# Patient Record
Sex: Male | Born: 1935
Health system: Southern US, Community
[De-identification: ages and names within clinical notes are randomized; demographics above are authoritative.]

## PROBLEM LIST (undated history)

## (undated) DIAGNOSIS — N400 Enlarged prostate without lower urinary tract symptoms: Secondary | ICD-10-CM

## (undated) DIAGNOSIS — M810 Age-related osteoporosis without current pathological fracture: Secondary | ICD-10-CM

## (undated) DIAGNOSIS — S62102A Fracture of unspecified carpal bone, left wrist, initial encounter for closed fracture: Secondary | ICD-10-CM

## (undated) DIAGNOSIS — N200 Calculus of kidney: Secondary | ICD-10-CM

## (undated) DIAGNOSIS — S62101A Fracture of unspecified carpal bone, right wrist, initial encounter for closed fracture: Secondary | ICD-10-CM

## (undated) HISTORY — DX: Benign prostatic hyperplasia without lower urinary tract symptoms: N40.0

## (undated) HISTORY — DX: Calculus of kidney: N20.0

## (undated) HISTORY — DX: Age-related osteoporosis without current pathological fracture: M81.0

## (undated) HISTORY — PX: TONSILLECTOMY: SUR1361

## (undated) HISTORY — DX: Fracture of unspecified carpal bone, left wrist, initial encounter for closed fracture: S62.102A

## (undated) HISTORY — DX: Fracture of unspecified carpal bone, right wrist, initial encounter for closed fracture: S62.101A

## (undated) HISTORY — PX: HERNIA REPAIR: SHX51

---

## 1982-09-05 DIAGNOSIS — S62102A Fracture of unspecified carpal bone, left wrist, initial encounter for closed fracture: Secondary | ICD-10-CM

## 1982-09-05 HISTORY — DX: Fracture of unspecified carpal bone, left wrist, initial encounter for closed fracture: S62.102A

## 1994-09-05 DIAGNOSIS — S62101A Fracture of unspecified carpal bone, right wrist, initial encounter for closed fracture: Secondary | ICD-10-CM

## 1994-09-05 HISTORY — DX: Fracture of unspecified carpal bone, right wrist, initial encounter for closed fracture: S62.101A

## 2007-03-06 ENCOUNTER — Ambulatory Visit: Payer: Self-pay | Admitting: Gastroenterology

## 2012-03-16 LAB — TSH: TSH: 1.22 u[IU]/mL (ref 0.41–5.90)

## 2012-12-04 DIAGNOSIS — M810 Age-related osteoporosis without current pathological fracture: Secondary | ICD-10-CM | POA: Insufficient documentation

## 2012-12-04 DIAGNOSIS — N200 Calculus of kidney: Secondary | ICD-10-CM | POA: Insufficient documentation

## 2012-12-04 DIAGNOSIS — I350 Nonrheumatic aortic (valve) stenosis: Secondary | ICD-10-CM | POA: Insufficient documentation

## 2012-12-11 ENCOUNTER — Encounter: Payer: Self-pay | Admitting: Internal Medicine

## 2012-12-11 ENCOUNTER — Ambulatory Visit (INDEPENDENT_AMBULATORY_CARE_PROVIDER_SITE_OTHER): Payer: Medicare Other | Admitting: Internal Medicine

## 2012-12-11 VITALS — BP 136/72 | HR 90 | Temp 97.9°F | Resp 18 | Ht 66.0 in | Wt 154.8 lb

## 2012-12-11 DIAGNOSIS — R972 Elevated prostate specific antigen [PSA]: Secondary | ICD-10-CM

## 2012-12-11 DIAGNOSIS — I359 Nonrheumatic aortic valve disorder, unspecified: Secondary | ICD-10-CM

## 2012-12-11 DIAGNOSIS — N400 Enlarged prostate without lower urinary tract symptoms: Secondary | ICD-10-CM

## 2012-12-11 DIAGNOSIS — N200 Calculus of kidney: Secondary | ICD-10-CM

## 2012-12-11 DIAGNOSIS — D72819 Decreased white blood cell count, unspecified: Secondary | ICD-10-CM

## 2012-12-11 DIAGNOSIS — I350 Nonrheumatic aortic (valve) stenosis: Secondary | ICD-10-CM

## 2012-12-11 DIAGNOSIS — E559 Vitamin D deficiency, unspecified: Secondary | ICD-10-CM

## 2012-12-11 DIAGNOSIS — M810 Age-related osteoporosis without current pathological fracture: Secondary | ICD-10-CM

## 2012-12-11 LAB — CBC WITH DIFFERENTIAL/PLATELET
Basophils Absolute: 0 10*3/uL (ref 0.0–0.1)
Basophils Relative: 0.5 % (ref 0.0–3.0)
Eosinophils Absolute: 0.2 10*3/uL (ref 0.0–0.7)
Eosinophils Relative: 2.9 % (ref 0.0–5.0)
HCT: 42.2 % (ref 39.0–52.0)
Hemoglobin: 14.1 g/dL (ref 13.0–17.0)
Lymphocytes Relative: 21.1 % (ref 12.0–46.0)
Lymphs Abs: 1.5 10*3/uL (ref 0.7–4.0)
MCHC: 33.5 g/dL (ref 30.0–36.0)
MCV: 89.5 fl (ref 78.0–100.0)
Monocytes Absolute: 0.4 10*3/uL (ref 0.1–1.0)
Monocytes Relative: 5.8 % (ref 3.0–12.0)
Neutro Abs: 5.1 10*3/uL (ref 1.4–7.7)
Neutrophils Relative %: 69.7 % (ref 43.0–77.0)
Platelets: 167 10*3/uL (ref 150.0–400.0)
RBC: 4.71 Mil/uL (ref 4.22–5.81)
RDW: 14.2 % (ref 11.5–14.6)
WBC: 7.3 10*3/uL (ref 4.5–10.5)

## 2012-12-11 LAB — PSA, MEDICARE: PSA: 7.79 ng/ml — ABNORMAL HIGH (ref 0.10–4.00)

## 2012-12-12 LAB — VITAMIN D 25 HYDROXY (VIT D DEFICIENCY, FRACTURES): Vit D, 25-Hydroxy: 29 ng/mL — ABNORMAL LOW (ref 30–89)

## 2012-12-16 ENCOUNTER — Encounter: Payer: Self-pay | Admitting: Internal Medicine

## 2012-12-16 DIAGNOSIS — N400 Enlarged prostate without lower urinary tract symptoms: Secondary | ICD-10-CM | POA: Insufficient documentation

## 2012-12-16 NOTE — Assessment & Plan Note (Signed)
Declines medication.  Check vitamin D level.

## 2012-12-16 NOTE — Progress Notes (Signed)
  Subjective:    Patient ID: Marvin Farley, male    DOB: May 21, 1932, 79 y.o.   MRN: [id number]  HPI 79 year old male with past history of rib fracture and previous radial fracture with documented osteoporosis who comes in today to follow up on this as well as to transfer care here to [facility].  Was previously seen by me at [facility].  States he recently has had some congestion.  Using saline and taking vitamin C.  Better.  No chest pain or tightness.  Breathing stable.  No increased sob.  No acid reflux.  No nausea or vomiting.  No bowel change.  Works long hours.  Some increased stress with family issues.  Feels he is handling things relatively well.     Past Medical History  Diagnosis Date  . Osteoporosis   . Kidney stones   . BPH (benign prostatic hypertrophy)   . Left wrist fracture 1984  . Right wrist fracture 1996    Current Outpatient Prescriptions on File Prior to Visit  Medication Sig Dispense Refill  . Multiple Vitamin (MULTIVITAMIN) capsule Take 1 capsule by mouth daily.       No current facility-administered medications on file prior to visit.    Review of Systems Patient denies any headache, lightheadedness or dizziness.  Some congestion now.  Better.  See above.  No chest pain, tightness or palpitations.  No increased shortness of breath, cough or congestion.  No acid reflux.  No nausea or vomiting.  No abdominal pain or cramping.  No bowel change, such as diarrhea, constipation, BRBPR or melana.  No urine change.  Handling stress relatively well.        Objective:   Physical Exam Filed Vitals:   12/11/12 1338  BP: 136/72  Pulse: 90  Temp: 97.9 F (36.6 C)  Resp: 18   Pulse recheck:  42  79 year old male in no acute distress.  HEENT:  Nares - clear.  Oropharynx - without lesions. NECK:  Supple.  Nontender.  No audible carotid bruit.  HEART:  Appears to be regular.  II/VI systolic murmur.   LUNGS:  No crackles or wheezing audible.  Respirations even and  unlabored.   RADIAL PULSE:  Equal bilaterally.  ABDOMEN:  Soft.  Nontender.  Bowel sounds present and normal.  No audible abdominal bruit.  EXTREMITIES:  No increased edema present.  DP pulses palpable and equal bilaterally.         Assessment & Plan:  GI.  Colonoscopy 7/08 - normal.    HEALTH MAINTENANCE.  Schedule a physical next visit.  He has declined urology referral for elevated PSA.  Will recheck today.  Colonoscopy 7/08 - normal.

## 2012-12-16 NOTE — Assessment & Plan Note (Signed)
Has a history of BPH.  Currently without urinary symptoms.  PSA increased.  Discussed with him regarding referral to urology.  He has declined.  Will recheck today.

## 2012-12-16 NOTE — Assessment & Plan Note (Signed)
Had follow up ECHO this fall.  Obtain results. Currently asymptomatic.  Has declined cardiology evaluation.

## 2012-12-16 NOTE — Assessment & Plan Note (Signed)
Asymptomatic.  Follow.   

## 2012-12-21 ENCOUNTER — Emergency Department: Payer: Self-pay | Admitting: Emergency Medicine

## 2013-01-14 ENCOUNTER — Encounter: Payer: Self-pay | Admitting: Internal Medicine

## 2013-02-21 ENCOUNTER — Encounter: Payer: Medicare Other | Admitting: Internal Medicine

## 2013-03-28 ENCOUNTER — Ambulatory Visit (INDEPENDENT_AMBULATORY_CARE_PROVIDER_SITE_OTHER): Payer: Self-pay | Admitting: Internal Medicine

## 2013-03-28 ENCOUNTER — Encounter: Payer: Self-pay | Admitting: Internal Medicine

## 2013-03-28 VITALS — BP 120/80 | HR 72 | Temp 98.7°F | Ht 65.5 in | Wt 154.8 lb

## 2013-03-28 DIAGNOSIS — N400 Enlarged prostate without lower urinary tract symptoms: Secondary | ICD-10-CM

## 2013-03-28 DIAGNOSIS — M810 Age-related osteoporosis without current pathological fracture: Secondary | ICD-10-CM

## 2013-03-28 DIAGNOSIS — I359 Nonrheumatic aortic valve disorder, unspecified: Secondary | ICD-10-CM

## 2013-03-28 DIAGNOSIS — N200 Calculus of kidney: Secondary | ICD-10-CM

## 2013-03-28 DIAGNOSIS — I350 Nonrheumatic aortic (valve) stenosis: Secondary | ICD-10-CM

## 2013-03-28 NOTE — Progress Notes (Signed)
  Subjective:    Patient ID: Marvin Farley, male    DOB: 06-17-1932, 79 y.o.   MRN: [id number]  HPI 79 year old male with past history of rib fracture and previous radial fracture with documented osteoporosis who comes in today to follow up on these issues as well as for a complete physical exam.   No chest pain or tightness.  Breathing stable.  No increased sob.  No acid reflux.  No nausea or vomiting.  No bowel change.  Works long hours.  Some increased stress with family issues.  Feels he is handling things relatively well.  A ladder fell and hit his head on Good Friday.  To ER.  Had CT and MRI.  Diagnosed with a concussion.  Took 6 weeks to get back to normal.  Does feel back to baseline now.  No headache or dizziness now.  Overall he feels he is doing well.     Past Medical History  Diagnosis Date  . Osteoporosis   . Kidney stones   . BPH (benign prostatic hypertrophy)   . Left wrist fracture 1984  . Right wrist fracture 1996    Current Outpatient Prescriptions on File Prior to Visit  Medication Sig Dispense Refill  . Multiple Vitamin (MULTIVITAMIN) capsule Take 1 capsule by mouth daily.       No current facility-administered medications on file prior to visit.    Review of Systems Patient denies any headache, lightheadedness or dizziness now.  Symptoms back to baseline.   No chest pain, tightness or palpitations.  No increased shortness of breath, cough or congestion.  No acid reflux.  No nausea or vomiting.  No abdominal pain or cramping.  No bowel change, such as diarrhea, constipation, BRBPR or melana.  No urine change.  Handling stress relatively well.   Overall he feels he is doing well.  Discussed elevated PSA.  Desires no further w/up or evaluation.       Objective:   Physical Exam  Filed Vitals:   03/28/13 1118  BP: 120/80  Pulse: 72  Temp: 98.7 F (37.1 C)   Blood pressure recheck:  120/78, pulse 59  79 year old male in no acute distress.  HEENT:  Nares - clear.   Oropharynx - without lesions. NECK:  Supple.  Nontender.  No audible carotid bruit.  HEART:  Appears to be regular.  II/VI systolic murmur.   LUNGS:  No crackles or wheezing audible.  Respirations even and unlabored.   RADIAL PULSE:  Equal bilaterally.  ABDOMEN:  Soft.  Nontender.  Bowel sounds present and normal.  No audible abdominal bruit.  GU:  Normal descended testicles.  No palpable testicular nodules.   RECTAL:  Could not appreciate any palpable prostate nodules.  Heme negative.   EXTREMITIES:  No increased edema present.  DP pulses palpable and equal bilaterally.       Assessment & Plan:  GI.  Colonoscopy 7/08 - normal.    PREVIOUS CONCUSSION.  Resolved.  Follow.   HEALTH MAINTENANCE.  Physical today.   He has declined urology referral for elevated PSA.  Will recheck today.  Colonoscopy 7/08 - normal.

## 2013-03-31 ENCOUNTER — Encounter: Payer: Self-pay | Admitting: Internal Medicine

## 2013-03-31 NOTE — Assessment & Plan Note (Signed)
Declines medication.  Check vitamin D level.

## 2013-03-31 NOTE — Assessment & Plan Note (Signed)
Asymptomatic.  Follow.   

## 2013-03-31 NOTE — Assessment & Plan Note (Signed)
Had follow up ECHO this fall.  Obtain results. Currently asymptomatic.  Has declined cardiology evaluation.

## 2013-03-31 NOTE — Assessment & Plan Note (Signed)
Has a history of BPH.  Currently without urinary symptoms.  PSA increased.  Discussed with him regarding referral to urology.  He has declined.  Will recheck today.

## 2013-04-08 ENCOUNTER — Encounter: Payer: Self-pay | Admitting: Internal Medicine

## 2013-05-30 ENCOUNTER — Ambulatory Visit (INDEPENDENT_AMBULATORY_CARE_PROVIDER_SITE_OTHER): Payer: Medicare Other | Admitting: Internal Medicine

## 2013-05-30 ENCOUNTER — Encounter: Payer: Self-pay | Admitting: Internal Medicine

## 2013-05-30 VITALS — BP 110/80 | HR 75 | Temp 98.2°F | Ht 65.5 in | Wt 157.2 lb

## 2013-05-30 DIAGNOSIS — J329 Chronic sinusitis, unspecified: Secondary | ICD-10-CM

## 2013-05-30 MED ORDER — AZITHROMYCIN 250 MG PO TABS: ORAL_TABLET | ORAL | Status: DC

## 2013-05-30 MED ORDER — FLUTICASONE PROPIONATE 50 MCG/ACT NA SUSP: 2.0000 | Freq: Every day | NASAL | Status: DC

## 2013-05-30 NOTE — Progress Notes (Signed)
  Subjective:    Patient ID: Marvin Farley, male    DOB: 1932-09-02, 79 y.o.   MRN: [id number]  Sinus Problem  79 year old male with past history of rib fracture and previous radial fracture with documented osteoporosis who comes in today as a work in with concerns regarding a possible sinus infection. Reports that starting one week ago, he developed sinus pressure.  Some headache.  Increase nasal congestion and drainage.  Some cough.  No sore throat.  No chest congestion.   No chest pain or tightness.  No increased sob.  No acid reflux.  No nausea or vomiting.  No bowel change.     Past Medical History  Diagnosis Date  . Osteoporosis   . Kidney stones   . BPH (benign prostatic hypertrophy)   . Left wrist fracture 1984  . Right wrist fracture 1996    Current Outpatient Prescriptions on File Prior to Visit  Medication Sig Dispense Refill  . Multiple Vitamin (MULTIVITAMIN) capsule Take 1 capsule by mouth daily.       No current facility-administered medications on file prior to visit.    Review of Systems Patient denies any headache, lightheadedness or dizziness.  Does report increased sinus pressure and congestion as outlined.  Increased drainage.  No chest pain, tightness or palpitations.  No increased shortness of breath.  Some cough.  No acid reflux.  No nausea or vomiting.  No abdominal pain or cramping.  No bowel change, such as diarrhea, constipation, BRBPR or melana.  No urine change.  Handling stress relatively well.   Has tried vitamin C.  Is flushing his nose.       Objective:   Physical Exam  Filed Vitals:   05/30/13 0850  BP: 110/80  Pulse: 75  Temp: 98.2 F (15.43 C)   79 year old male in no acute distress.  HEENT:  Nares - slightly erythematous turbinates.  Oropharynx - without lesions.  TMs visualized - without erythema.   NECK:  Supple.  Nontender.  HEART:  Appears to be regular.  II/VI systolic murmur.   LUNGS:  No crackles or wheezing audible.  Respirations  even and unlabored.   RADIAL PULSE:  Equal bilaterally.      Assessment & Plan:  SINUSITIS.  Treat with zpak as directed.  Flonase and saline nasal spray as directed.  Robitussin as directed.  Notify me or be reevaluated if symptoms persist, worsen or do not resolve.    GI.  Colonoscopy 7/08 - normal.    PREVIOUS CONCUSSION.  Resolved.  Follow.   HEALTH MAINTENANCE.  Physical last visit.   He has declined urology referral for elevated PSA.  Colonoscopy 7/08 - normal.

## 2013-06-02 ENCOUNTER — Encounter: Payer: Self-pay | Admitting: Internal Medicine

## 2013-06-21 ENCOUNTER — Other Ambulatory Visit (INDEPENDENT_AMBULATORY_CARE_PROVIDER_SITE_OTHER): Payer: Medicare Other

## 2013-06-21 DIAGNOSIS — Z125 Encounter for screening for malignant neoplasm of prostate: Secondary | ICD-10-CM

## 2013-06-21 DIAGNOSIS — N400 Enlarged prostate without lower urinary tract symptoms: Secondary | ICD-10-CM

## 2013-06-21 DIAGNOSIS — I359 Nonrheumatic aortic valve disorder, unspecified: Secondary | ICD-10-CM

## 2013-06-21 DIAGNOSIS — I350 Nonrheumatic aortic (valve) stenosis: Secondary | ICD-10-CM

## 2013-06-21 LAB — COMPREHENSIVE METABOLIC PANEL
ALT: 10 U/L (ref 0–53)
AST: 22 U/L (ref 0–37)
Albumin: 4 g/dL (ref 3.5–5.2)
Alkaline Phosphatase: 63 U/L (ref 39–117)
BUN: 20 mg/dL (ref 6–23)
CO2: 28 mEq/L (ref 19–32)
Calcium: 9.1 mg/dL (ref 8.4–10.5)
Chloride: 102 mEq/L (ref 96–112)
Creatinine, Ser: 1 mg/dL (ref 0.4–1.5)
GFR: 76.47 mL/min (ref >60.00)
Glucose, Bld: 98 mg/dL (ref 70–99)
Potassium: 5.2 mEq/L — ABNORMAL HIGH (ref 3.5–5.1)
Sodium: 138 mEq/L (ref 135–145)
Total Bilirubin: 0.8 mg/dL (ref 0.3–1.2)
Total Protein: 6.8 g/dL (ref 6.0–8.3)

## 2013-06-21 LAB — LIPID PANEL
Cholesterol: 177 mg/dL (ref 0–200)
HDL: 59.4 mg/dL (ref >39.00)
LDL Cholesterol: 106 mg/dL — ABNORMAL HIGH (ref 0–99)
Total CHOL/HDL Ratio: 3
Triglycerides: 56 mg/dL (ref 0.0–149.0)
VLDL: 11.2 mg/dL (ref 0.0–40.0)

## 2013-06-21 LAB — PSA, MEDICARE: PSA: 6.45 ng/ml — ABNORMAL HIGH (ref 0.10–4.00)

## 2013-06-24 ENCOUNTER — Other Ambulatory Visit: Payer: Self-pay | Admitting: Internal Medicine

## 2013-06-24 DIAGNOSIS — E875 Hyperkalemia: Secondary | ICD-10-CM

## 2013-06-24 NOTE — Progress Notes (Signed)
Order placed for f/u potassium.  

## 2013-06-25 ENCOUNTER — Telehealth: Payer: Self-pay | Admitting: Internal Medicine

## 2013-06-25 NOTE — Telephone Encounter (Signed)
Spouse dropped off medical report for automobile insurance Need ASAP In box

## 2013-06-25 NOTE — Telephone Encounter (Signed)
See his wife's message for more detail.  I reviewed the form and I need to see him to have this completed.  Let me know and can schedule an appt.

## 2013-06-25 NOTE — Telephone Encounter (Signed)
Form placed in your green folder

## 2013-06-26 ENCOUNTER — Other Ambulatory Visit (INDEPENDENT_AMBULATORY_CARE_PROVIDER_SITE_OTHER): Payer: Medicare Other

## 2013-06-26 ENCOUNTER — Encounter: Payer: Self-pay | Admitting: *Deleted

## 2013-06-26 DIAGNOSIS — E875 Hyperkalemia: Secondary | ICD-10-CM

## 2013-06-26 LAB — POTASSIUM: Potassium: 4.9 mEq/L (ref 3.5–5.1)

## 2013-06-26 NOTE — Telephone Encounter (Signed)
Left message for patient to call the office to schedule appt to complete medical form

## 2013-06-26 NOTE — Telephone Encounter (Signed)
I spoke with pt this morning while he was in the office for labs & informed him to stop & schedule appt at front desk

## 2013-07-03 ENCOUNTER — Other Ambulatory Visit: Payer: Medicare Other

## 2013-07-04 ENCOUNTER — Ambulatory Visit (INDEPENDENT_AMBULATORY_CARE_PROVIDER_SITE_OTHER): Payer: Medicare Other | Admitting: Internal Medicine

## 2013-07-04 ENCOUNTER — Encounter: Payer: Self-pay | Admitting: Internal Medicine

## 2013-07-04 VITALS — BP 110/80 | HR 70 | Temp 98.0°F | Ht 65.5 in | Wt 156.2 lb

## 2013-07-04 DIAGNOSIS — M810 Age-related osteoporosis without current pathological fracture: Secondary | ICD-10-CM

## 2013-07-04 DIAGNOSIS — I350 Nonrheumatic aortic (valve) stenosis: Secondary | ICD-10-CM

## 2013-07-04 DIAGNOSIS — I359 Nonrheumatic aortic valve disorder, unspecified: Secondary | ICD-10-CM

## 2013-07-07 ENCOUNTER — Encounter: Payer: Self-pay | Admitting: Internal Medicine

## 2013-07-07 NOTE — Assessment & Plan Note (Signed)
Had follow up ECHO this fall.  Currently asymptomatic.   Stays active with no cardiac symptoms with increased activity or exertion.  Has declined cardiac evaluation.

## 2013-07-07 NOTE — Progress Notes (Signed)
  Subjective:    Patient ID: Marvin Farley, male    DOB: 1932-07-06, 79 y.o.   MRN: [id number]  HPI 79 year old male with past history of rib fracture and previous radial fracture with documented osteoporosis who comes in today for a form completion.  No chest pain or tightness.  Breathing stable.  No increased sob.  No acid reflux.  No nausea or vomiting.  No bowel change.  Works long hours.  No cardiac symptoms with increased activity or exertion.  No back pain or leg pain.  No light headedness or dizziness.  Vision unchanged.  Overall he feels he is doing well.    Past Medical History  Diagnosis Date  . Osteoporosis   . Kidney stones   . BPH (benign prostatic hypertrophy)   . Left wrist fracture 1984  . Right wrist fracture 1996    Current Outpatient Prescriptions on File Prior to Visit  Medication Sig Dispense Refill  . Multiple Vitamin (MULTIVITAMIN) capsule Take 1 capsule by mouth daily.       No current facility-administered medications on file prior to visit.    Review of Systems Patient denies any headache, lightheadedness or dizziness.   No chest pain, tightness or palpitations.  No increased shortness of breath, cough or congestion.  No acid reflux.  No nausea or vomiting.  No abdominal pain or cramping.  No back pain.  Able to lift and pull without difficulty.  No cardiac symptoms with increased activity or exertion.        Objective:   Physical Exam  Filed Vitals:   07/04/13 1602  BP: 110/80  Pulse: 70  Temp: 98 F (39.62 C)   79 year old male in no acute distress.  HEENT:  Nares - clear.  Oropharynx - without lesions. NECK:  Supple.  Nontender.  No audible carotid bruit.  HEART:  Appears to be regular.  II/VI systolic murmur.   LUNGS:  No crackles or wheezing audible.  Respirations even and unlabored.   RADIAL PULSE:  Equal bilaterally.  ABDOMEN:  Soft.  Nontender.  Bowel sounds present and normal.  No audible abdominal bruit.   EXTREMITIES:  No increased  edema present.  DP pulses palpable and equal bilaterally.       Assessment & Plan:  GI.  Colonoscopy 7/08 - normal.    HEALTH MAINTENANCE.  Physical 03/28/13.   He has declined urology referral for elevated PSA.   Colonoscopy 7/08 - normal.

## 2013-07-07 NOTE — Assessment & Plan Note (Signed)
Declines medication.  Check vitamin D level.  Continue weight bearing exercise.    

## 2013-10-01 ENCOUNTER — Ambulatory Visit (INDEPENDENT_AMBULATORY_CARE_PROVIDER_SITE_OTHER): Payer: Medicare Other | Admitting: Internal Medicine

## 2013-10-01 ENCOUNTER — Encounter: Payer: Self-pay | Admitting: Internal Medicine

## 2013-10-01 VITALS — BP 120/70 | HR 75 | Temp 97.9°F | Ht 65.5 in | Wt 155.5 lb

## 2013-10-01 DIAGNOSIS — I359 Nonrheumatic aortic valve disorder, unspecified: Secondary | ICD-10-CM

## 2013-10-01 DIAGNOSIS — M810 Age-related osteoporosis without current pathological fracture: Secondary | ICD-10-CM

## 2013-10-01 DIAGNOSIS — N400 Enlarged prostate without lower urinary tract symptoms: Secondary | ICD-10-CM

## 2013-10-01 DIAGNOSIS — N200 Calculus of kidney: Secondary | ICD-10-CM

## 2013-10-01 DIAGNOSIS — I350 Nonrheumatic aortic (valve) stenosis: Secondary | ICD-10-CM

## 2013-10-01 NOTE — Progress Notes (Signed)
Pre-visit discussion using our clinic review tool. No additional management support is needed unless otherwise documented below in the visit note.  

## 2013-10-05 ENCOUNTER — Encounter: Payer: Self-pay | Admitting: Internal Medicine

## 2013-10-05 NOTE — Assessment & Plan Note (Signed)
Declines medication.  Check vitamin D level.  Continue weight bearing exercise.    

## 2013-10-05 NOTE — Progress Notes (Signed)
  Subjective:    Patient ID: Marvin Farley, male    DOB: 08/23/1932, 80 y.o.   MRN: [id number]  HPI 80 year old male with past history of rib fracture and previous radial fracture with documented osteoporosis who comes in today for a scheduled follow up.   No chest pain or tightness.  Breathing stable.  No increased sob.  No acid reflux.  No nausea or vomiting.  No bowel change.  Works long hours.  No cardiac symptoms with increased activity or exertion.  No back pain or leg pain.  No light headedness or dizziness.  Vision unchanged.  Overall he feels he is doing well.    Past Medical History  Diagnosis Date  . Osteoporosis   . Kidney stones   . BPH (benign prostatic hypertrophy)   . Left wrist fracture 1984  . Right wrist fracture 1996    Current Outpatient Prescriptions on File Prior to Visit  Medication Sig Dispense Refill  . Multiple Vitamin (MULTIVITAMIN) capsule Take 1 capsule by mouth daily.       No current facility-administered medications on file prior to visit.    Review of Systems Patient denies any headache, lightheadedness or dizziness.   No chest pain, tightness or palpitations.  No increased shortness of breath, cough or congestion.  No acid reflux.  No nausea or vomiting.  No abdominal pain or cramping.  No back pain.  No cardiac symptoms with increased activity or exertion.        Objective:   Physical Exam  Filed Vitals:   10/01/13 1603  BP: 120/70  Pulse: 75  Temp: 97.9 F (109.51 C)   80 year old male in no acute distress.  HEENT:  Nares - clear.  Oropharynx - without lesions. NECK:  Supple.  Nontender.  No audible carotid bruit.  HEART:  Appears to be regular.  II/VI systolic murmur.   LUNGS:  No crackles or wheezing audible.  Respirations even and unlabored.   RADIAL PULSE:  Equal bilaterally.  ABDOMEN:  Soft.  Nontender.  Bowel sounds present and normal.  No audible abdominal bruit.   EXTREMITIES:  No increased edema present.  DP pulses palpable and  equal bilaterally.       Assessment & Plan:  GI.  Colonoscopy 7/08 - normal.    HEALTH MAINTENANCE.  Physical 03/28/13.   He has declined urology referral for elevated PSA.   Colonoscopy 7/08 - normal.

## 2013-10-05 NOTE — Assessment & Plan Note (Signed)
Had follow up ECHO this fall.  Currently asymptomatic.   Stays active with no cardiac symptoms with increased activity or exertion.  Has declined cardiac evaluation and continues to decline.  Follow.

## 2013-10-05 NOTE — Assessment & Plan Note (Signed)
Has a history of BPH.  Currently without urinary symptoms.  PSA increased.  Discussed with him regarding referral to urology.  He has declined and has continued to decline.

## 2013-10-05 NOTE — Assessment & Plan Note (Signed)
Asymptomatic.  Follow.   

## 2014-03-02 IMAGING — CR DG RIBS 2V*L*
1 series · 4 of 4 positions shown · non-contrast
Comparison: none

REASON FOR EXAM: fall, left rib pain
COMMENTS:

PROCEDURE:     DXR - DXR RIBS LEFT UNILATERAL  - December 21, 2012 [DATE]
RESULT:     History: Fall.
Fines: No evidence of displaced rib fracture or pneumothorax. Degenerative
changes thoracolumbar spine.

[Series 1: w ribs ap upper left · 0.14mm/px · 4 of 4 slices shown]
[im 1/4]
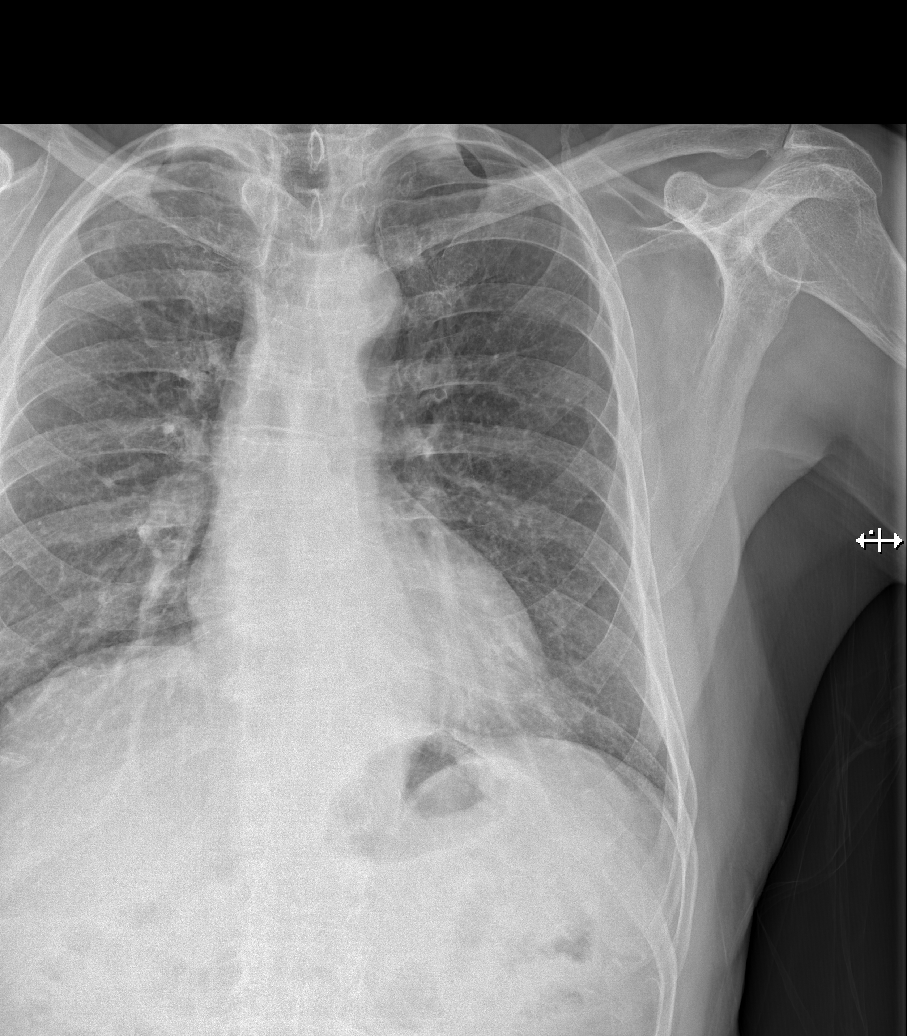
[im 2/4]
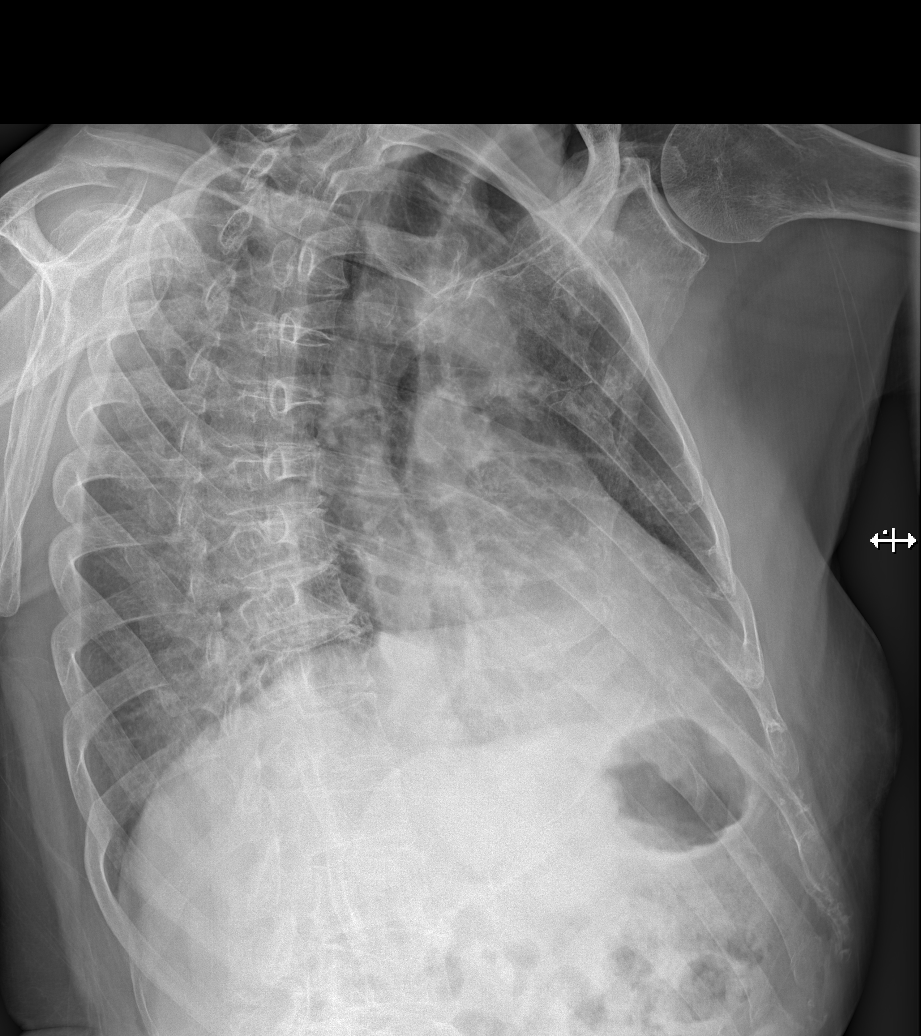
[im 3/4]
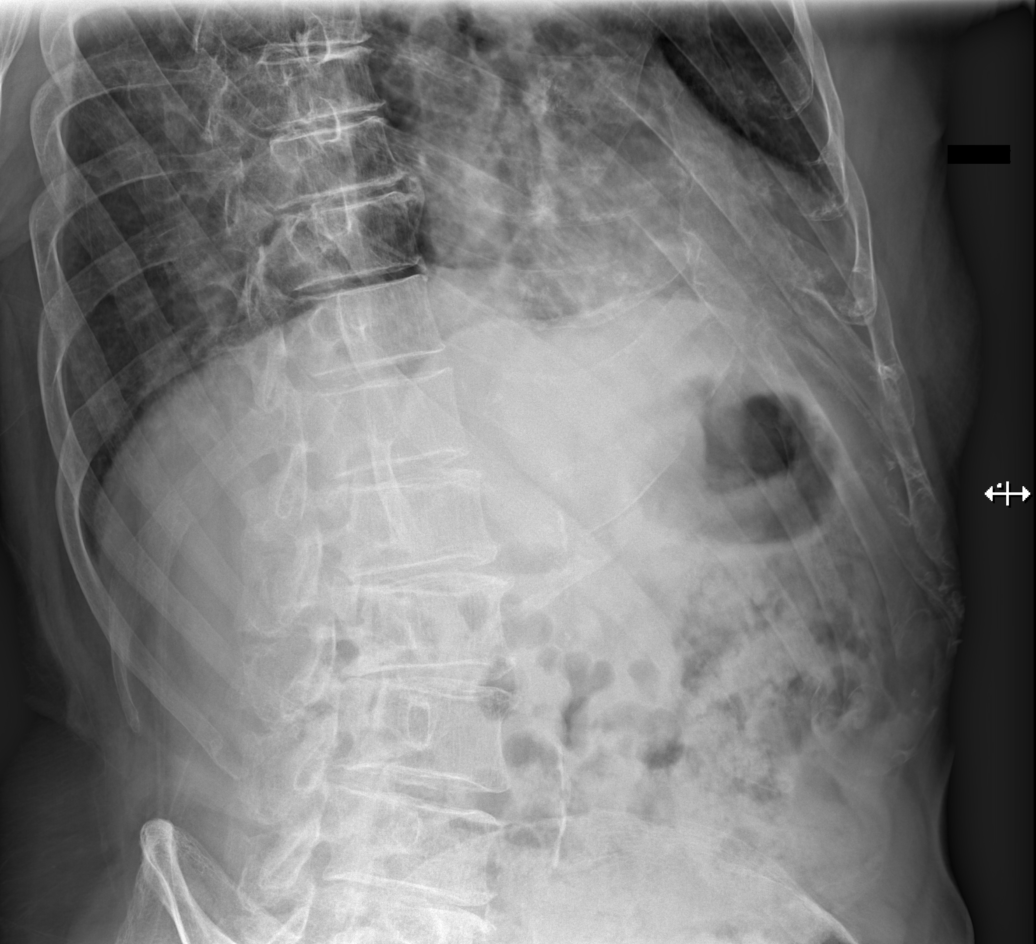
[im 4/4]
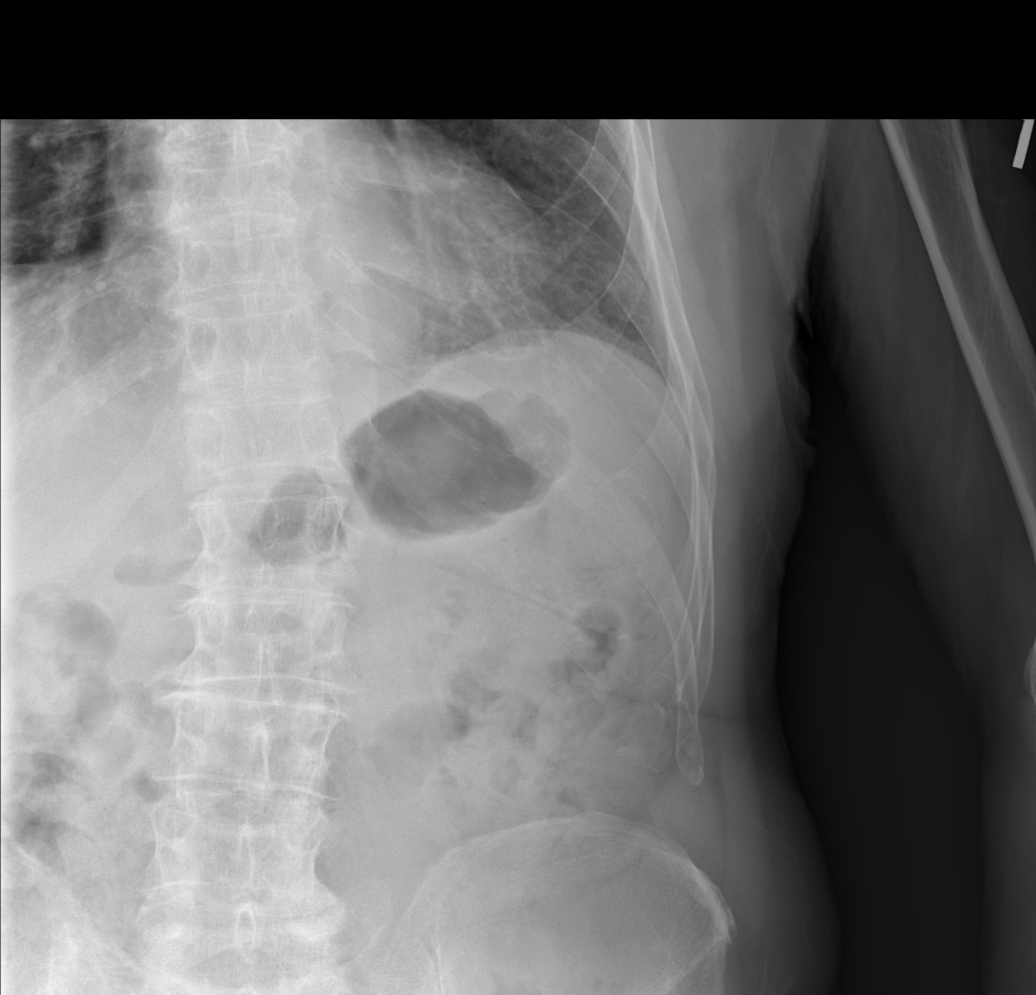

[4 of 4 positions shown; findings below may reference images not displayed]

IMPRESSION: No evidence of displaced rib fracture or pneumothorax.

## 2014-04-03 ENCOUNTER — Encounter: Payer: Self-pay | Admitting: Internal Medicine

## 2014-04-03 ENCOUNTER — Ambulatory Visit (INDEPENDENT_AMBULATORY_CARE_PROVIDER_SITE_OTHER): Payer: Medicare Other | Admitting: Internal Medicine

## 2014-04-03 VITALS — BP 120/60 | HR 65 | Temp 97.9°F | Ht 65.75 in | Wt 154.0 lb

## 2014-04-03 DIAGNOSIS — M810 Age-related osteoporosis without current pathological fracture: Secondary | ICD-10-CM

## 2014-04-03 DIAGNOSIS — Z1211 Encounter for screening for malignant neoplasm of colon: Secondary | ICD-10-CM

## 2014-04-03 DIAGNOSIS — I359 Nonrheumatic aortic valve disorder, unspecified: Secondary | ICD-10-CM

## 2014-04-03 DIAGNOSIS — M7989 Other specified soft tissue disorders: Secondary | ICD-10-CM

## 2014-04-03 DIAGNOSIS — Z23 Encounter for immunization: Secondary | ICD-10-CM

## 2014-04-03 DIAGNOSIS — Z13220 Encounter for screening for lipoid disorders: Secondary | ICD-10-CM

## 2014-04-03 DIAGNOSIS — N400 Enlarged prostate without lower urinary tract symptoms: Secondary | ICD-10-CM

## 2014-04-03 DIAGNOSIS — I350 Nonrheumatic aortic (valve) stenosis: Secondary | ICD-10-CM

## 2014-04-03 DIAGNOSIS — N200 Calculus of kidney: Secondary | ICD-10-CM

## 2014-04-03 NOTE — Progress Notes (Signed)
Pre visit review using our clinic review tool, if applicable. No additional management support is needed unless otherwise documented below in the visit note. 

## 2014-04-06 ENCOUNTER — Encounter: Payer: Self-pay | Admitting: Internal Medicine

## 2014-04-06 DIAGNOSIS — M7989 Other specified soft tissue disorders: Secondary | ICD-10-CM | POA: Insufficient documentation

## 2014-04-06 NOTE — Assessment & Plan Note (Signed)
Has a history of BPH.  Currently without urinary symptoms.  PSA increased.  Discussed with him again today regarding referral to urology.  He has declined and continues to decline.

## 2014-04-06 NOTE — Assessment & Plan Note (Signed)
Asymptomatic.  Follow.   

## 2014-04-06 NOTE — Progress Notes (Signed)
  Subjective:    Patient ID: Marvin Farley, male    DOB: 1932/06/25, 80 y.o.   MRN: [id number]  HPI 80 year old male with past history of rib fracture and previous radial fracture with documented osteoporosis who comes in today to follow up on these issues as well as for a complete physical exam.    No chest pain or tightness.  Breathing stable.  No increased sob.  No acid reflux.  No nausea or vomiting.  No bowel change.  Works long hours.  No cardiac symptoms with increased activity or exertion.  No back pain or leg pain.  No light headedness or dizziness.  Vision unchanged.  Overall he feels he is doing well. He has noticed increased swelling and tightness of his right fifth finger.  Worsening.  Denies any injury.     Past Medical History  Diagnosis Date  . Osteoporosis   . Kidney stones   . BPH (benign prostatic hypertrophy)   . Left wrist fracture 1984  . Right wrist fracture 1996    Current Outpatient Prescriptions on File Prior to Visit  Medication Sig Dispense Refill  . Multiple Vitamin (MULTIVITAMIN) capsule Take 1 capsule by mouth daily.       No current facility-administered medications on file prior to visit.    Review of Systems Patient denies any headache, lightheadedness or dizziness.   No chest pain, tightness or palpitations.  No increased shortness of breath, cough or congestion.  No acid reflux.  No nausea or vomiting.  No abdominal pain or cramping.  No back pain.  No cardiac symptoms with increased activity or exertion.   Bowels stable.  Swelling and tightness - right fifth finger.       Objective:   Physical Exam  Filed Vitals:   04/03/14 0832  BP: 120/60  Pulse: 65  Temp: 97.9 F (17.46 C)   80 year old male in no acute distress.  HEENT:  Nares - clear.  Oropharynx - without lesions. NECK:  Supple.  Nontender.  No audible carotid bruit.  HEART:  Appears to be regular.   LUNGS:  No crackles or wheezing audible.  Respirations even and unlabored.    RADIAL PULSE:  Equal bilaterally.  ABDOMEN:  Soft.  Nontender.  Bowel sounds present and normal.  No audible abdominal bruit.  GU:  Normal descended testicles.  No palpable testicular nodules.   RECTAL:  Could not appreciate any palpable prostate nodules.  Heme negative.   EXTREMITIES:  No increased edema present.  DP pulses palpable and equal bilaterally.  MSK:  Increased swelling and fullness right fifth finger (PIP).   Limited flexion.  No increased erythema.          Assessment & Plan:  GI.  Colonoscopy 7/08 - normal.    HEALTH MAINTENANCE.  Physical today.   He has declined urology referral for elevated PSA.   Colonoscopy 7/08 - normal.  IFOB given.

## 2014-04-06 NOTE — Assessment & Plan Note (Signed)
Right fifth finger swelling (PIP).  Increased fullness and tight - with increased flexion.  Refer to Dr Jefm Bryant for further evaluation.  We discussed splinting.

## 2014-04-06 NOTE — Assessment & Plan Note (Signed)
Declines medication.  Check vitamin D level.  Continue weight bearing exercise.

## 2014-04-06 NOTE — Assessment & Plan Note (Signed)
Had follow up ECHO this past fall.  Currently asymptomatic.   Stays active with no cardiac symptoms with increased activity or exertion.  Has declined cardiac evaluation and continues to decline.  Follow.  Did agree to follow ECHO in 11/15.

## 2014-04-15 ENCOUNTER — Other Ambulatory Visit (INDEPENDENT_AMBULATORY_CARE_PROVIDER_SITE_OTHER): Payer: Medicare Other

## 2014-04-15 DIAGNOSIS — N400 Enlarged prostate without lower urinary tract symptoms: Secondary | ICD-10-CM

## 2014-04-15 DIAGNOSIS — I350 Nonrheumatic aortic (valve) stenosis: Secondary | ICD-10-CM

## 2014-04-15 DIAGNOSIS — Z13220 Encounter for screening for lipoid disorders: Secondary | ICD-10-CM

## 2014-04-15 DIAGNOSIS — I359 Nonrheumatic aortic valve disorder, unspecified: Secondary | ICD-10-CM

## 2014-04-15 DIAGNOSIS — M810 Age-related osteoporosis without current pathological fracture: Secondary | ICD-10-CM

## 2014-04-15 LAB — COMPREHENSIVE METABOLIC PANEL
ALT: 10 U/L (ref 0–53)
AST: 24 U/L (ref 0–37)
Albumin: 4 g/dL (ref 3.5–5.2)
Alkaline Phosphatase: 58 U/L (ref 39–117)
BUN: 23 mg/dL (ref 6–23)
CO2: 27 mEq/L (ref 19–32)
Calcium: 9.2 mg/dL (ref 8.4–10.5)
Chloride: 102 mEq/L (ref 96–112)
Creatinine, Ser: 1 mg/dL (ref 0.4–1.5)
GFR: 74.59 mL/min (ref >60.00)
Glucose, Bld: 91 mg/dL (ref 70–99)
Potassium: 5.2 mEq/L — ABNORMAL HIGH (ref 3.5–5.1)
Sodium: 136 mEq/L (ref 135–145)
Total Bilirubin: 0.7 mg/dL (ref 0.2–1.2)
Total Protein: 6.8 g/dL (ref 6.0–8.3)

## 2014-04-15 LAB — CBC WITH DIFFERENTIAL/PLATELET
Basophils Absolute: 0 10*3/uL (ref 0.0–0.1)
Basophils Relative: 0.8 % (ref 0.0–3.0)
Eosinophils Absolute: 0.2 10*3/uL (ref 0.0–0.7)
Eosinophils Relative: 5.7 % — ABNORMAL HIGH (ref 0.0–5.0)
HCT: 44.9 % (ref 39.0–52.0)
Hemoglobin: 15.1 g/dL (ref 13.0–17.0)
Lymphocytes Relative: 43.7 % (ref 12.0–46.0)
Lymphs Abs: 1.8 10*3/uL (ref 0.7–4.0)
MCHC: 33.6 g/dL (ref 30.0–36.0)
MCV: 89.4 fl (ref 78.0–100.0)
Monocytes Absolute: 0.4 10*3/uL (ref 0.1–1.0)
Monocytes Relative: 8.7 % (ref 3.0–12.0)
Neutro Abs: 1.7 10*3/uL (ref 1.4–7.7)
Neutrophils Relative %: 41.1 % — ABNORMAL LOW (ref 43.0–77.0)
Platelets: 182 10*3/uL (ref 150.0–400.0)
RBC: 5.01 Mil/uL (ref 4.22–5.81)
RDW: 15 % (ref 11.5–15.5)
WBC: 4.1 10*3/uL (ref 4.0–10.5)

## 2014-04-15 LAB — LIPID PANEL
Cholesterol: 157 mg/dL (ref 0–200)
HDL: 55 mg/dL (ref >39.00)
LDL Cholesterol: 94 mg/dL (ref 0–99)
NonHDL: 102
Total CHOL/HDL Ratio: 3
Triglycerides: 38 mg/dL (ref 0.0–149.0)
VLDL: 7.6 mg/dL (ref 0.0–40.0)

## 2014-04-15 LAB — VITAMIN D 25 HYDROXY (VIT D DEFICIENCY, FRACTURES): VITD: 26.39 ng/mL — ABNORMAL LOW (ref 30.00–100.00)

## 2014-04-15 LAB — PSA, MEDICARE: PSA: 5.84 ng/ml — ABNORMAL HIGH (ref 0.10–4.00)

## 2014-04-15 LAB — TSH: TSH: 1.38 u[IU]/mL (ref 0.35–4.50)

## 2014-04-16 ENCOUNTER — Other Ambulatory Visit (INDEPENDENT_AMBULATORY_CARE_PROVIDER_SITE_OTHER): Payer: Medicare Other

## 2014-04-16 ENCOUNTER — Encounter: Payer: Self-pay | Admitting: *Deleted

## 2014-04-16 DIAGNOSIS — Z1211 Encounter for screening for malignant neoplasm of colon: Secondary | ICD-10-CM

## 2014-04-16 LAB — FECAL OCCULT BLOOD, IMMUNOCHEMICAL: Fecal Occult Bld: NEGATIVE

## 2014-04-22 ENCOUNTER — Telehealth: Payer: Self-pay | Admitting: *Deleted

## 2014-04-22 DIAGNOSIS — E875 Hyperkalemia: Secondary | ICD-10-CM

## 2014-04-22 NOTE — Telephone Encounter (Signed)
Pt is coming in tomorrow what labs and dx?  

## 2014-04-23 ENCOUNTER — Other Ambulatory Visit (INDEPENDENT_AMBULATORY_CARE_PROVIDER_SITE_OTHER): Payer: Medicare Other

## 2014-04-23 DIAGNOSIS — E875 Hyperkalemia: Secondary | ICD-10-CM

## 2014-04-23 NOTE — Telephone Encounter (Signed)
Order placed for potassium recheck

## 2014-04-24 LAB — POTASSIUM: Potassium: 4.4 mEq/L (ref 3.5–5.1)

## 2014-04-25 ENCOUNTER — Encounter: Payer: Self-pay | Admitting: *Deleted

## 2014-10-02 ENCOUNTER — Ambulatory Visit: Payer: Self-pay | Admitting: Surgery

## 2014-10-06 ENCOUNTER — Encounter: Payer: Self-pay | Admitting: Internal Medicine

## 2014-10-06 ENCOUNTER — Ambulatory Visit (INDEPENDENT_AMBULATORY_CARE_PROVIDER_SITE_OTHER): Payer: BLUE CROSS/BLUE SHIELD | Admitting: Internal Medicine

## 2014-10-06 VITALS — BP 100/70 | HR 74 | Temp 98.4°F | Ht 65.75 in | Wt 155.0 lb

## 2014-10-06 DIAGNOSIS — M75101 Unspecified rotator cuff tear or rupture of right shoulder, not specified as traumatic: Secondary | ICD-10-CM

## 2014-10-06 DIAGNOSIS — M810 Age-related osteoporosis without current pathological fracture: Secondary | ICD-10-CM

## 2014-10-06 DIAGNOSIS — L989 Disorder of the skin and subcutaneous tissue, unspecified: Secondary | ICD-10-CM

## 2014-10-06 DIAGNOSIS — I350 Nonrheumatic aortic (valve) stenosis: Secondary | ICD-10-CM

## 2014-10-06 DIAGNOSIS — N400 Enlarged prostate without lower urinary tract symptoms: Secondary | ICD-10-CM

## 2014-10-06 NOTE — Patient Instructions (Signed)
Vitamin D3 1000 units per day 

## 2014-10-06 NOTE — Progress Notes (Signed)
Patient ID: STELLA ENCARNACION, male   DOB: [birth year], 81 y.o.   MRN: [id number]   Subjective:    Patient ID: STANFORD STRAUCH, male    DOB: 03-Mar-1932, 81 y.o.   MRN: [id number]  HPI  Patient here for a scheduled follow up.  Has a history of aortic stenosis and osteoporosis.  Stays active.  Has his own yard business.  No cardiac symptoms with increased activity or exertion.  Breathing stable.  Bowels stable.  Recent increased shoulder pain.  MRI - rotator cuff tear.  Going to physical therapy.   Persistent right arm lesion.     Past Medical History  Diagnosis Date  . Osteoporosis   . Kidney stones   . BPH (benign prostatic hypertrophy)   . Left wrist fracture 1984  . Right wrist fracture 1996    Current Outpatient Prescriptions on File Prior to Visit  Medication Sig Dispense Refill  . Multiple Vitamin (MULTIVITAMIN) capsule Take 1 capsule by mouth daily.     No current facility-administered medications on file prior to visit.    Review of Systems  Constitutional: Negative for fatigue and unexpected weight change.  HENT: Negative for congestion and sinus pressure.   Respiratory: Negative for cough, chest tightness and shortness of breath.   Cardiovascular: Negative for chest pain and palpitations.  Gastrointestinal: Negative for nausea, abdominal pain, diarrhea and constipation.  Musculoskeletal: Negative for back pain and joint swelling.  Skin:       Persistent skin lesion - right arm.    Neurological: Negative for dizziness, light-headedness and headaches.       Objective:    Physical Exam  Constitutional: He appears well-developed and well-nourished. No distress.  HENT:  Nose: Nose normal.  Mouth/Throat: Oropharynx is clear and moist.  Neck: Neck supple. No thyromegaly present.  Cardiovascular: Normal rate and regular rhythm.   Murmur (2/6 systolic murmur) heard. Pulmonary/Chest: Effort normal and breath sounds normal. No respiratory distress. He has no wheezes.    Abdominal: Soft. Bowel sounds are normal. There is no tenderness.  Musculoskeletal: He exhibits no edema.  Lymphadenopathy:    He has no cervical adenopathy.  Skin:  Skin lesion - right arm.   Psychiatric: He has a normal mood and affect. His behavior is normal.    BP 100/70 mmHg  Pulse 74  Temp(Src) 98.4 F (36.9 C) (Oral)  Ht 5' 5.75" (1.67 m)  Wt 155 lb (70.308 kg)  BMI 25.21 kg/m2  SpO2 97% Wt Readings from Last 3 Encounters:  10/06/14 155 lb (70.308 kg)  04/03/14 154 lb (69.854 kg)  10/01/13 155 lb 8 oz (70.534 kg)     Lab Results  Component Value Date   WBC 4.1 04/15/2014   HGB 15.1 04/15/2014   HCT 44.9 04/15/2014   PLT 182.0 04/15/2014   GLUCOSE 91 04/15/2014   CHOL 157 04/15/2014   TRIG 38.0 04/15/2014   HDL 55.00 04/15/2014   LDLCALC 94 04/15/2014   ALT 10 04/15/2014   AST 24 04/15/2014   NA 136 04/15/2014   K 4.4 04/23/2014   CL 102 04/15/2014   CREATININE 1.0 04/15/2014   BUN 23 04/15/2014   CO2 27 04/15/2014   TSH 1.38 04/15/2014   PSA 5.84* 04/15/2014      Assessment & Plan:   Problem List Items Addressed This Visit    Aortic stenosis    Had follow up ECHO.  Currently asymptomatic.  Stays active with no cardiac symptoms with increased activity or exertion.  Discussed with him today.  He declines further cardiac w/up.        BPH (benign prostatic hypertrophy)    Has a history of BPH.  PSA increased.  Discussed referral to urology.  He declines.  Declines further w/up.        Osteoporosis    Recent vitamin D level - low.  Start vitamin D3 1000 q day.  He declines any other medication for his bones.        Rotator cuff tear    Seeing ortho.  Going to physical therapy.       Skin lesion    Persistent right arm lesion.  Refer to dermatology.  Wants to go to [facility] (previous office of Dr Koleen Nimrod).         Other Visit Diagnoses    Arm skin lesion, right    -  Primary    Relevant Orders    Ambulatory referral to Dermatology         Einar Pheasant, MD

## 2014-10-06 NOTE — Progress Notes (Signed)
Pre visit review using our clinic review tool, if applicable. No additional management support is needed unless otherwise documented below in the visit note. 

## 2014-10-08 ENCOUNTER — Encounter: Payer: Self-pay | Admitting: Internal Medicine

## 2014-10-08 DIAGNOSIS — M75100 Unspecified rotator cuff tear or rupture of unspecified shoulder, not specified as traumatic: Secondary | ICD-10-CM | POA: Insufficient documentation

## 2014-10-08 DIAGNOSIS — L989 Disorder of the skin and subcutaneous tissue, unspecified: Secondary | ICD-10-CM | POA: Insufficient documentation

## 2014-10-08 NOTE — Assessment & Plan Note (Signed)
Has a history of BPH.  PSA increased.  Discussed referral to urology.  He declines.  Declines further w/up.

## 2014-10-08 NOTE — Assessment & Plan Note (Signed)
Had follow up ECHO.  Currently asymptomatic.  Stays active with no cardiac symptoms with increased activity or exertion.  Discussed with him today.  He declines further cardiac w/up.

## 2014-10-08 NOTE — Assessment & Plan Note (Signed)
Recent vitamin D level - low.  Start vitamin D3 1000 q day.  He declines any other medication for his bones.

## 2014-10-08 NOTE — Assessment & Plan Note (Signed)
Seeing ortho.  Going to physical therapy.

## 2014-10-08 NOTE — Assessment & Plan Note (Signed)
Persistent right arm lesion.  Refer to dermatology.  Wants to go to [facility] (previous office of Dr Koleen Nimrod).

## 2014-10-09 ENCOUNTER — Encounter: Payer: Self-pay | Admitting: Internal Medicine

## 2015-03-10 ENCOUNTER — Telehealth: Payer: Self-pay | Admitting: Internal Medicine

## 2015-03-10 NOTE — Telephone Encounter (Signed)
Please f/u with pt and confirm he was seen.  Thanks

## 2015-03-10 NOTE — Telephone Encounter (Signed)
Patient Name: Marvin Farley  DOB: 1931-12-25    Initial Comment Caller states he cut his finger right across the knuckle. It continues to bleed every time moves finger. The bandage he has on it is soaked    Nurse Assessment  Nurse: Julien Girt, RN, Almyra Free Date/Time Eilene Ghazi Time): 03/10/2015 9:29:54 AM  Confirm and document reason for call. If symptomatic, describe symptoms. ---Caller states he cut his right fore finger across the knuckle on Monday. States it was deep and he is worried it might need a stitch, this morning it bleeds every time he moves the finger it bleeds. He has a bandage on it now, no active bleeding as long as he is still.  Has the patient traveled out of the country within the last 30 days? ---Not Applicable  Does the patient require triage? ---Yes  Related visit to physician within the last 2 weeks? ---No  Does the PT have any chronic conditions? (i.e. diabetes, asthma, etc.) ---No     Guidelines    Guideline Title Affirmed Question Affirmed Notes  Cuts and Lacerations Skin is split open or gaping (or length > 1/2 inch or 12 mm on the skin, 1/4 inch or 6 mm on the face)    Final Disposition User   Go to ED Now Julien Girt, RN, Almyra Free    Comments  Caller states he will either go to the ED or an UC. Advised to cb as needed. Verbalized understanding.

## 2015-03-10 NOTE — Telephone Encounter (Signed)
Spoke with the patient.  Patient chose not to go to the ER or UC.  Patient has a bandage on the R hand.  Since lunch today it has slowed down bleeding dramatically.  Patient states that he has used his hand and it seems to be okay now.  Stated that he hasn't changed the bandage since lunch time hours ago.  He doesn't believe he needs any stitches now.  Thanked me for checking up on him and will call the office if concerns come up again.

## 2015-03-10 NOTE — Telephone Encounter (Signed)
FYI

## 2015-04-06 ENCOUNTER — Encounter: Payer: Self-pay | Admitting: Internal Medicine

## 2015-04-06 ENCOUNTER — Ambulatory Visit (INDEPENDENT_AMBULATORY_CARE_PROVIDER_SITE_OTHER): Payer: BLUE CROSS/BLUE SHIELD | Admitting: Internal Medicine

## 2015-04-06 VITALS — BP 100/60 | HR 77 | Temp 98.3°F | Ht 65.75 in | Wt 152.2 lb

## 2015-04-06 DIAGNOSIS — Z13220 Encounter for screening for lipoid disorders: Secondary | ICD-10-CM

## 2015-04-06 DIAGNOSIS — M810 Age-related osteoporosis without current pathological fracture: Secondary | ICD-10-CM

## 2015-04-06 DIAGNOSIS — I350 Nonrheumatic aortic (valve) stenosis: Secondary | ICD-10-CM

## 2015-04-06 DIAGNOSIS — N400 Enlarged prostate without lower urinary tract symptoms: Secondary | ICD-10-CM

## 2015-04-06 DIAGNOSIS — N200 Calculus of kidney: Secondary | ICD-10-CM

## 2015-04-06 DIAGNOSIS — M75101 Unspecified rotator cuff tear or rupture of right shoulder, not specified as traumatic: Secondary | ICD-10-CM

## 2015-04-06 DIAGNOSIS — R42 Dizziness and giddiness: Secondary | ICD-10-CM

## 2015-04-06 DIAGNOSIS — Z0000 Encounter for general adult medical examination without abnormal findings: Secondary | ICD-10-CM | POA: Diagnosis not present

## 2015-04-06 NOTE — Progress Notes (Signed)
Patient ID: Marvin Farley, male   DOB: 08/27/1932, 81 y.o.   MRN: [id number]   Subjective:    Patient ID: Marvin Farley, male    DOB: November 03, 1931, 81 y.o.   MRN: [id number]  HPI  Patient here to follow up on his current medical issues as well as for a complete physical exam.  Stays active.  No cardiac symptoms with increased activity or exertion.  No sob.  No increased cough or congestion.  Does report some occasional light headedness with quick movement.  Minimal nasal congestion.  No headache.  No significant dizziness.  Eating and drinking well.  No nausea or vomiting.  Bowels stable.    Past Medical History  Diagnosis Date  . Osteoporosis   . Kidney stones   . BPH (benign prostatic hypertrophy)   . Left wrist fracture 1984  . Right wrist fracture 1996    Outpatient Encounter Prescriptions as of 04/06/2015  Medication Sig  . Multiple Vitamin (MULTIVITAMIN) capsule Take 1 capsule by mouth daily.   No facility-administered encounter medications on file as of 04/06/2015.   Review of Systems  Constitutional: Negative for appetite change and unexpected weight change.  HENT: Negative for sinus pressure and sore throat.        Minimal nasal congestion.    Eyes: Negative for pain and visual disturbance.  Respiratory: Negative for cough, chest tightness and shortness of breath.   Cardiovascular: Negative for chest pain, palpitations and leg swelling.  Gastrointestinal: Negative for nausea, vomiting, abdominal pain and diarrhea.  Genitourinary: Negative for dysuria and difficulty urinating.  Musculoskeletal: Negative for joint swelling.  Skin: Negative for color change and rash.  Neurological: Positive for light-headedness (minimal lightheadedness with quick movement. ). Negative for headaches.  Hematological: Negative for adenopathy. Does not bruise/bleed easily.  Psychiatric/Behavioral: Negative for dysphoric mood and agitation.       Objective:    Physical Exam    Constitutional: He is oriented to person, place, and time. He appears well-developed and well-nourished. No distress.  HENT:  Head: Normocephalic and atraumatic.  Nose: Nose normal.  Mouth/Throat: Oropharynx is clear and moist. No oropharyngeal exudate.  Eyes: Conjunctivae are normal. Right eye exhibits no discharge. Left eye exhibits no discharge.  Neck: Neck supple. No thyromegaly present.  Cardiovascular: Normal rate and regular rhythm.   Pulmonary/Chest: Breath sounds normal. No respiratory distress. He has no wheezes.  Abdominal: Soft. Bowel sounds are normal. There is no tenderness.  Genitourinary:  Pt declined.   Musculoskeletal: He exhibits no edema or tenderness.  Lymphadenopathy:    He has no cervical adenopathy.  Neurological: He is alert and oriented to person, place, and time.  Skin: Skin is warm and dry. No rash noted.  Psychiatric: He has a normal mood and affect. His behavior is normal.    BP 100/60 mmHg  Pulse 77  Temp(Src) 98.3 F (36.8 C) (Oral)  Ht 5' 5.75" (1.67 m)  Wt 152 lb 4 oz (69.06 kg)  BMI 24.76 kg/m2  SpO2 95% Wt Readings from Last 3 Encounters:  04/06/15 152 lb 4 oz (69.06 kg)  10/06/14 155 lb (70.308 kg)  04/03/14 154 lb (69.854 kg)     Lab Results  Component Value Date   WBC 4.1 04/15/2014   HGB 15.1 04/15/2014   HCT 44.9 04/15/2014   PLT 182.0 04/15/2014   GLUCOSE 91 04/15/2014   CHOL 157 04/15/2014   TRIG 38.0 04/15/2014   HDL 55.00 04/15/2014   LDLCALC 94 04/15/2014  ALT 10 04/15/2014   AST 24 04/15/2014   NA 136 04/15/2014   K 4.4 04/23/2014   CL 102 04/15/2014   CREATININE 1.0 04/15/2014   BUN 23 04/15/2014   CO2 27 04/15/2014   TSH 1.38 04/15/2014   PSA 5.84* 04/15/2014       Assessment & Plan:   Problem List Items Addressed This Visit    Aortic stenosis - Primary    Has had ECHO previously.  Currently asymptomatic.  Stays active with no cardiac symptoms with increased activity or exertion.  Discussed f/u ECHO or  f/u with cardiology.  He declines.  Follow.       Relevant Orders   CBC with Differential/Platelet   Comprehensive metabolic panel   BPH (benign prostatic hypertrophy)    Has a history of BPH.  PSA previously increased.  Did agree to recheck with next labs.  Declines referral to urology or further w/up.       Relevant Orders   PSA, Medicare   Health care maintenance    Physical today 04/06/15.  Check psa with next labs.  Colonoscopy 03/2007 normal.        Kidney stones    Asymptomatic.        Light headedness    Occasional light headedness with quick movements.  Saline nasal spray and nasacort nasal spray as outlined.  Discussed further evaluation, including ENT evaluation.  He declines any further w/up.  Follow.  Will notify me if persistent problems or if changes his mind.       Relevant Orders   TSH   Osteoporosis    He has declined any further treatment for his bones.  Follow.       Relevant Orders   Vit D  25 hydroxy (rtn osteoporosis monitoring)   Rotator cuff tear    Seeing ortho.  Has increased rom and no significant pain.  Follow.        Other Visit Diagnoses    Screening cholesterol level        Relevant Orders    Lipid panel        Einar Pheasant, MD

## 2015-04-06 NOTE — Progress Notes (Signed)
Pre visit review using our clinic review tool, if applicable. No additional management support is needed unless otherwise documented below in the visit note. 

## 2015-04-06 NOTE — Patient Instructions (Signed)
Saline nasal spray - flush nose at least 2-3x/day  nasacort nasal spray - 2 sprays each nostril one time per day.  Do this in the evening.  

## 2015-04-08 ENCOUNTER — Encounter: Payer: Self-pay | Admitting: Internal Medicine

## 2015-04-08 DIAGNOSIS — Z0000 Encounter for general adult medical examination without abnormal findings: Secondary | ICD-10-CM | POA: Insufficient documentation

## 2015-04-08 DIAGNOSIS — R42 Dizziness and giddiness: Secondary | ICD-10-CM | POA: Insufficient documentation

## 2015-04-08 NOTE — Assessment & Plan Note (Signed)
Occasional light headedness with quick movements.  Saline nasal spray and nasacort nasal spray as outlined.  Discussed further evaluation, including ENT evaluation.  He declines any further w/up.  Follow.  Will notify me if persistent problems or if changes his mind.

## 2015-04-08 NOTE — Assessment & Plan Note (Signed)
Seeing ortho.  Has increased rom and no significant pain.  Follow.

## 2015-04-08 NOTE — Assessment & Plan Note (Signed)
Physical today 04/06/15.  Check psa with next labs.  Colonoscopy 03/2007 normal.

## 2015-04-08 NOTE — Assessment & Plan Note (Signed)
Has a history of BPH.  PSA previously increased.  Did agree to recheck with next labs.  Declines referral to urology or further w/up.

## 2015-04-08 NOTE — Assessment & Plan Note (Signed)
Asymptomatic. 

## 2015-04-08 NOTE — Assessment & Plan Note (Signed)
Has had ECHO previously.  Currently asymptomatic.  Stays active with no cardiac symptoms with increased activity or exertion.  Discussed f/u ECHO or f/u with cardiology.  He declines.  Follow.

## 2015-04-08 NOTE — Assessment & Plan Note (Signed)
He has declined any further treatment for his bones.  Follow.

## 2015-04-14 ENCOUNTER — Other Ambulatory Visit (INDEPENDENT_AMBULATORY_CARE_PROVIDER_SITE_OTHER): Payer: BLUE CROSS/BLUE SHIELD

## 2015-04-14 DIAGNOSIS — N400 Enlarged prostate without lower urinary tract symptoms: Secondary | ICD-10-CM

## 2015-04-14 DIAGNOSIS — R42 Dizziness and giddiness: Secondary | ICD-10-CM

## 2015-04-14 DIAGNOSIS — I350 Nonrheumatic aortic (valve) stenosis: Secondary | ICD-10-CM

## 2015-04-14 DIAGNOSIS — Z13220 Encounter for screening for lipoid disorders: Secondary | ICD-10-CM | POA: Diagnosis not present

## 2015-04-14 DIAGNOSIS — M810 Age-related osteoporosis without current pathological fracture: Secondary | ICD-10-CM

## 2015-04-14 LAB — COMPREHENSIVE METABOLIC PANEL
ALT: 11 U/L (ref 0–53)
AST: 23 U/L (ref 0–37)
Albumin: 4 g/dL (ref 3.5–5.2)
Alkaline Phosphatase: 63 U/L (ref 39–117)
BUN: 19 mg/dL (ref 6–23)
CO2: 29 mEq/L (ref 19–32)
Calcium: 9.1 mg/dL (ref 8.4–10.5)
Chloride: 103 mEq/L (ref 96–112)
Creatinine, Ser: 0.99 mg/dL (ref 0.40–1.50)
GFR: 77.01 mL/min (ref >60.00)
Glucose, Bld: 92 mg/dL (ref 70–99)
Potassium: 4.9 mEq/L (ref 3.5–5.1)
Sodium: 138 mEq/L (ref 135–145)
Total Bilirubin: 0.7 mg/dL (ref 0.2–1.2)
Total Protein: 6.5 g/dL (ref 6.0–8.3)

## 2015-04-14 LAB — CBC WITH DIFFERENTIAL/PLATELET
Basophils Absolute: 0 10*3/uL (ref 0.0–0.1)
Basophils Relative: 0.8 % (ref 0.0–3.0)
Eosinophils Absolute: 0.2 10*3/uL (ref 0.0–0.7)
Eosinophils Relative: 4.7 % (ref 0.0–5.0)
HCT: 44.1 % (ref 39.0–52.0)
Hemoglobin: 14.9 g/dL (ref 13.0–17.0)
Lymphocytes Relative: 37 % (ref 12.0–46.0)
Lymphs Abs: 1.6 10*3/uL (ref 0.7–4.0)
MCHC: 33.8 g/dL (ref 30.0–36.0)
MCV: 90.3 fl (ref 78.0–100.0)
Monocytes Absolute: 0.4 10*3/uL (ref 0.1–1.0)
Monocytes Relative: 9 % (ref 3.0–12.0)
Neutro Abs: 2.1 10*3/uL (ref 1.4–7.7)
Neutrophils Relative %: 48.5 % (ref 43.0–77.0)
Platelets: 192 10*3/uL (ref 150.0–400.0)
RBC: 4.89 Mil/uL (ref 4.22–5.81)
RDW: 15.7 % — ABNORMAL HIGH (ref 11.5–15.5)
WBC: 4.4 10*3/uL (ref 4.0–10.5)

## 2015-04-14 LAB — PSA, MEDICARE: PSA: 5.67 ng/ml — ABNORMAL HIGH (ref 0.10–4.00)

## 2015-04-14 LAB — VITAMIN D 25 HYDROXY (VIT D DEFICIENCY, FRACTURES): VITD: 20.46 ng/mL — ABNORMAL LOW (ref 30.00–100.00)

## 2015-04-14 LAB — LIPID PANEL
Cholesterol: 168 mg/dL (ref 0–200)
HDL: 57.6 mg/dL (ref >39.00)
LDL Cholesterol: 99 mg/dL (ref 0–99)
NonHDL: 110.01
Total CHOL/HDL Ratio: 3
Triglycerides: 55 mg/dL (ref 0.0–149.0)
VLDL: 11 mg/dL (ref 0.0–40.0)

## 2015-04-14 LAB — TSH: TSH: 1.54 u[IU]/mL (ref 0.35–4.50)

## 2015-05-25 ENCOUNTER — Telehealth: Payer: Self-pay | Admitting: Internal Medicine

## 2015-05-25 ENCOUNTER — Ambulatory Visit (INDEPENDENT_AMBULATORY_CARE_PROVIDER_SITE_OTHER): Payer: Medicare Other | Admitting: Nurse Practitioner

## 2015-05-25 ENCOUNTER — Encounter: Payer: Self-pay | Admitting: Nurse Practitioner

## 2015-05-25 VITALS — BP 110/70 | HR 81 | Temp 98.4°F | Resp 18 | Ht 65.75 in | Wt 152.8 lb

## 2015-05-25 DIAGNOSIS — J069 Acute upper respiratory infection, unspecified: Secondary | ICD-10-CM | POA: Insufficient documentation

## 2015-05-25 DIAGNOSIS — B9789 Other viral agents as the cause of diseases classified elsewhere: Principal | ICD-10-CM

## 2015-05-25 NOTE — Telephone Encounter (Signed)
Pt scheduled 10:45am with Morey Hummingbird

## 2015-05-25 NOTE — Telephone Encounter (Signed)
Pt states he has a sinus infection and wants to know if something can be called in? If possible. Pharmacy is [facility]. Thank You!

## 2015-05-25 NOTE — Progress Notes (Signed)
Pre visit review using our clinic review tool, if applicable. No additional management support is needed unless otherwise documented below in the visit note. 

## 2015-05-25 NOTE — Patient Instructions (Addendum)
Delsym for the cough. Benadryl at night is fine to help you sleep.   Mucinex (plain, not DM) is helpful to move mucous out   Drink water and rest. Tylenol as needed for headache and continue Neti Pot.   Call us if not improving by Friday.

## 2015-05-25 NOTE — Assessment & Plan Note (Signed)
New onset of symptoms this weekend. Pt has 2 family members he was in contact with this weekend that have similar symptoms including a great grand-daughter. Will treat conservatively with OTC medications. Mucinex plain and delsym recommended. Continue Neti Pot and use tylenol or ibuprofen as needed. RTC if fever of 101 or not improved in 5 days. Pt verbalized understanding.

## 2015-05-25 NOTE — Progress Notes (Signed)
Patient ID: Marvin Farley, male    DOB: [birth year]  Age: 81 y.o. MRN: [id number]  CC: URI   HPI Marvin Farley presents for URI symptoms x 3 days.  1) Started this weekend. Patient reports cough, headache, congestion. Coughing worse at night.  Sick contacts- 2 members of his family   Treatment to date:  Neti pot- helped somewhat Benadryl- took once at nigh- helped   History Marvin Farley has a past medical history of Osteoporosis; Kidney stones; BPH (benign prostatic hypertrophy); Left wrist fracture (1984); and Right wrist fracture (1996).   He has past surgical history that includes Hernia repair (Bilateral) and Tonsillectomy.   His family history includes Cancer in his mother; Heart disease in his father.He reports that he has never smoked. He has never used smokeless tobacco. He reports that he does not drink alcohol or use illicit drugs.  Outpatient Prescriptions Prior to Visit  Medication Sig Dispense Refill  . Multiple Vitamin (MULTIVITAMIN) capsule Take 1 capsule by mouth daily.     No facility-administered medications prior to visit.    ROS Review of Systems  Constitutional: Negative for fever, chills, diaphoresis and fatigue.  HENT: Positive for congestion, postnasal drip, rhinorrhea, sinus pressure and sneezing. Negative for ear discharge, ear pain and sore throat.   Eyes: Negative for visual disturbance.  Respiratory: Positive for cough. Negative for chest tightness and wheezing.   Cardiovascular: Negative for chest pain, palpitations and leg swelling.  Gastrointestinal: Negative for nausea, vomiting and diarrhea.  Skin: Negative for rash.  Neurological: Positive for headaches. Negative for dizziness.    Objective:  BP 110/70 mmHg  Pulse 81  Temp(Src) 98.4 F (36.9 C)  Resp 18  Ht 5' 5.75" (1.67 m)  Wt 152 lb 12.8 oz (69.31 kg)  BMI 24.85 kg/m2  SpO2 95%  Physical Exam  Constitutional: He is oriented to person, place, and time. He appears well-developed  and well-nourished. No distress.  HENT:  Head: Normocephalic and atraumatic.  Right Ear: External ear normal.  Left Ear: External ear normal.  TM's partially visualized around cerumen were clear and not bulging or retracted  Eyes: EOM are normal. Pupils are equal, round, and reactive to light. Right eye exhibits no discharge. Left eye exhibits no discharge. No scleral icterus.  Neck: Neck supple.  Cardiovascular: Normal rate and regular rhythm.  Exam reveals no gallop and no friction rub.   Murmur heard. Pulmonary/Chest: Effort normal and breath sounds normal. No respiratory distress. He has no wheezes. He has no rales. He exhibits no tenderness.  Lymphadenopathy:    He has no cervical adenopathy.  Neurological: He is alert and oriented to person, place, and time.  Skin: Skin is warm and dry. No rash noted. He is not diaphoretic.  Psychiatric: He has a normal mood and affect. His behavior is normal. Judgment and thought content normal.   Assessment & Plan:   Whittaker was seen today for uri.  Diagnoses and all orders for this visit:  Viral URI with cough  I am having Marvin Farley maintain his multivitamin and Cholecalciferol (VITAMIN D-3 PO).  Meds ordered this encounter  Medications  . Cholecalciferol (VITAMIN D-3 PO)    Sig: Take by mouth daily.     Follow-up: Return if symptoms worsen or fail to improve.

## 2015-10-08 ENCOUNTER — Ambulatory Visit: Payer: BLUE CROSS/BLUE SHIELD | Admitting: Internal Medicine

## 2015-10-08 ENCOUNTER — Ambulatory Visit (INDEPENDENT_AMBULATORY_CARE_PROVIDER_SITE_OTHER): Payer: Self-pay | Admitting: Internal Medicine

## 2015-10-08 ENCOUNTER — Encounter: Payer: Self-pay | Admitting: Internal Medicine

## 2015-10-08 VITALS — BP 118/70 | HR 71 | Temp 98.0°F | Resp 18 | Ht 65.75 in | Wt 155.5 lb

## 2015-10-08 DIAGNOSIS — N400 Enlarged prostate without lower urinary tract symptoms: Secondary | ICD-10-CM | POA: Diagnosis not present

## 2015-10-08 DIAGNOSIS — I350 Nonrheumatic aortic (valve) stenosis: Secondary | ICD-10-CM | POA: Diagnosis not present

## 2015-10-08 DIAGNOSIS — R972 Elevated prostate specific antigen [PSA]: Secondary | ICD-10-CM

## 2015-10-08 DIAGNOSIS — M810 Age-related osteoporosis without current pathological fracture: Secondary | ICD-10-CM | POA: Diagnosis not present

## 2015-10-08 NOTE — Progress Notes (Signed)
Pre-visit discussion using our clinic review tool. No additional management support is needed unless otherwise documented below in the visit note.  

## 2015-10-08 NOTE — Progress Notes (Signed)
Patient ID: Marvin Farley, male   DOB: 03-Apr-1932, 82 y.o.   MRN: [id number]   Subjective:    Patient ID: Marvin Farley, male    DOB: 26-Sep-1931, 82 y.o.   MRN: [id number]  HPI  Patient with past history of BPH, osteoporosis and aortic stenosis.  He comes in today for a scheduled follow up.  He stays active.  Still working.  No chest pain or tightness.  No sob.  No acid reflux.  No abdominal pain or cramping.  Bowels stable.     Past Medical History  Diagnosis Date  . Osteoporosis   . Kidney stones   . BPH (benign prostatic hypertrophy)   . Left wrist fracture 1984  . Right wrist fracture 1996   Past Surgical History  Procedure Laterality Date  . Hernia repair Bilateral     inguinal  . Tonsillectomy     Family History  Problem Relation Age of Onset  . Cancer Mother     leukemia  . Heart disease Father     heart attack   Social History   Social History  . Marital Status: Married    Spouse Name: N/A  . Number of Children: N/A  . Years of Education: N/A   Social History Main Topics  . Smoking status: Never Smoker   . Smokeless tobacco: Never Used  . Alcohol Use: No  . Drug Use: No  . Sexual Activity: Not Asked   Other Topics Concern  . None   Social History Narrative    Outpatient Encounter Prescriptions as of 10/08/2015  Medication Sig  . Cholecalciferol (VITAMIN D-3 PO) Take by mouth daily.  . Multiple Vitamin (MULTIVITAMIN) capsule Take 1 capsule by mouth daily.   No facility-administered encounter medications on file as of 10/08/2015.    Review of Systems  Constitutional: Negative for appetite change and unexpected weight change.  HENT: Negative for congestion and sinus pressure.   Respiratory: Negative for cough, chest tightness and shortness of breath.   Cardiovascular: Negative for chest pain, palpitations and leg swelling.  Gastrointestinal: Negative for nausea, vomiting, abdominal pain and diarrhea.  Genitourinary: Negative for dysuria and  difficulty urinating.  Musculoskeletal: Negative for back pain and joint swelling.  Skin: Negative for color change and rash.  Neurological: Negative for dizziness, light-headedness and headaches.  Psychiatric/Behavioral: Negative for dysphoric mood and agitation.       Objective:    Physical Exam  Constitutional: He appears well-developed and well-nourished. No distress.  HENT:  Nose: Nose normal.  Mouth/Throat: Oropharynx is clear and moist.  Eyes: Conjunctivae are normal. Right eye exhibits no discharge. Left eye exhibits no discharge.  Neck: Neck supple. No thyromegaly present.  Cardiovascular: Normal rate and regular rhythm.   Pulmonary/Chest: Effort normal and breath sounds normal. No respiratory distress.  Abdominal: Soft. Bowel sounds are normal. There is no tenderness.  Musculoskeletal: He exhibits no edema or tenderness.  Lymphadenopathy:    He has no cervical adenopathy.  Skin: No rash noted. No erythema.  Psychiatric: He has a normal mood and affect. His behavior is normal.    BP 118/70 mmHg  Pulse 71  Temp(Src) 98 F (36.7 C) (Oral)  Resp 18  Ht 5' 5.75" (1.67 m)  Wt 155 lb 8 oz (70.534 kg)  BMI 25.29 kg/m2  SpO2 95% Wt Readings from Last 3 Encounters:  10/08/15 155 lb 8 oz (70.534 kg)  05/25/15 152 lb 12.8 oz (69.31 kg)  04/06/15 152 lb 4 oz (69.06 kg)  Lab Results  Component Value Date   WBC 4.4 04/14/2015   HGB 14.9 04/14/2015   HCT 44.1 04/14/2015   PLT 192.0 04/14/2015   GLUCOSE 92 04/14/2015   CHOL 168 04/14/2015   TRIG 55.0 04/14/2015   HDL 57.60 04/14/2015   LDLCALC 99 04/14/2015   ALT 11 04/14/2015   AST 23 04/14/2015   NA 138 04/14/2015   K 4.9 04/14/2015   CL 103 04/14/2015   CREATININE 0.99 04/14/2015   BUN 19 04/14/2015   CO2 29 04/14/2015   TSH 1.54 04/14/2015   PSA 5.67* 04/14/2015       Assessment & Plan:   Problem List Items Addressed This Visit    Aortic stenosis - Primary    Has had echo previously.  Discussed  f/u echo.  He declines.  Feels he is doing well.  Desires no further intervention at this time.        BPH (benign prostatic hypertrophy)    Has a history of BPH.  PSA has been increased (slightly).  Discussed w/up and referral again today.  He declines.  Desires no further intervention or testing.        Elevated PSA    Declines any further evaluation, testing or w/up.        Osteoporosis    Has declined any further treatment for his bones.  Continue weight bearing exercise.  Vitamin D supplements.            Einar Pheasant, MD

## 2015-10-11 ENCOUNTER — Encounter: Payer: Self-pay | Admitting: Internal Medicine

## 2015-10-11 DIAGNOSIS — R972 Elevated prostate specific antigen [PSA]: Secondary | ICD-10-CM | POA: Insufficient documentation

## 2015-10-11 NOTE — Assessment & Plan Note (Signed)
Declines any further evaluation, testing or w/up.

## 2015-10-11 NOTE — Assessment & Plan Note (Signed)
Has declined any further treatment for his bones.  Continue weight bearing exercise.  Vitamin D supplements.

## 2015-10-11 NOTE — Assessment & Plan Note (Signed)
Has a history of BPH.  PSA has been increased (slightly).  Discussed w/up and referral again today.  He declines.  Desires no further intervention or testing.

## 2015-10-11 NOTE — Assessment & Plan Note (Signed)
Has had echo previously.  Discussed f/u echo.  He declines.  Feels he is doing well.  Desires no further intervention at this time.

## 2016-02-18 ENCOUNTER — Telehealth: Payer: Self-pay | Admitting: *Deleted

## 2016-02-18 NOTE — Telephone Encounter (Signed)
FYI Patient was advised to follow up with Urgent Care by someone on his job, He called back to stating that he will be seen at Urgent Care today.

## 2016-02-18 NOTE — Telephone Encounter (Signed)
FYI, thanks.

## 2016-02-18 NOTE — Telephone Encounter (Signed)
Good I had tried to call and advise the same. thanks

## 2016-02-18 NOTE — Telephone Encounter (Signed)
Left a VM to return my call. 

## 2016-02-18 NOTE — Telephone Encounter (Signed)
FYI :Patient fell off the ladder nine days ago, he went to urgent care he felt fine after leaving. This morning he has had a headache with occasional blurred vision.  He's scheduled with Dr. Caryl Bis for tomorrow morning.

## 2016-02-18 NOTE — Telephone Encounter (Signed)
Patient should be evaluated today if he is having headache and blurry vision. I recommend evaluation today at a walk-in clinic as we do not have any available appointments today.

## 2016-02-19 ENCOUNTER — Other Ambulatory Visit: Payer: Self-pay | Admitting: Physician Assistant

## 2016-02-19 ENCOUNTER — Ambulatory Visit: Payer: Medicare Other | Admitting: Family Medicine

## 2016-02-19 DIAGNOSIS — G44309 Post-traumatic headache, unspecified, not intractable: Secondary | ICD-10-CM

## 2016-02-22 ENCOUNTER — Ambulatory Visit
Admission: RE | Admit: 2016-02-22 | Discharge: 2016-02-22 | Disposition: A | Discharge status: Home / Self Care | Payer: Worker's Compensation | Source: Ambulatory Visit | Attending: Radiology | Admitting: Radiology

## 2016-02-22 DIAGNOSIS — G44309 Post-traumatic headache, unspecified, not intractable: Secondary | ICD-10-CM | POA: Diagnosis not present

## 2016-04-07 ENCOUNTER — Ambulatory Visit (INDEPENDENT_AMBULATORY_CARE_PROVIDER_SITE_OTHER): Payer: Medicare Other | Admitting: Internal Medicine

## 2016-04-07 ENCOUNTER — Encounter: Payer: Self-pay | Admitting: Internal Medicine

## 2016-04-07 VITALS — BP 110/70 | HR 77 | Temp 97.8°F | Resp 17 | Ht 65.75 in | Wt 151.5 lb

## 2016-04-07 DIAGNOSIS — M810 Age-related osteoporosis without current pathological fracture: Secondary | ICD-10-CM | POA: Diagnosis not present

## 2016-04-07 DIAGNOSIS — R972 Elevated prostate specific antigen [PSA]: Secondary | ICD-10-CM

## 2016-04-07 DIAGNOSIS — Z0000 Encounter for general adult medical examination without abnormal findings: Secondary | ICD-10-CM

## 2016-04-07 DIAGNOSIS — E559 Vitamin D deficiency, unspecified: Secondary | ICD-10-CM

## 2016-04-07 DIAGNOSIS — N400 Enlarged prostate without lower urinary tract symptoms: Secondary | ICD-10-CM | POA: Diagnosis not present

## 2016-04-07 DIAGNOSIS — I350 Nonrheumatic aortic (valve) stenosis: Secondary | ICD-10-CM | POA: Diagnosis not present

## 2016-04-07 NOTE — Progress Notes (Signed)
Patient ID: Marvin Farley, male   DOB: Oct 29, 1931, 82 y.o.   MRN: [id number]   Subjective:    Patient ID: Marvin Farley, male    DOB: 11-10-1931, 82 y.o.   MRN: [id number]  HPI  Patient here for his physical exam.  Stays active.  Still working.  No chest pain.  No sob.  No acid reflux.  No abdominal pain or cramping.  Bowels stable.  No urine or bowel change.  Overall feels he is doing well.     Past Medical History:  Diagnosis Date  . BPH (benign prostatic hypertrophy)   . Kidney stones   . Left wrist fracture 1984  . Osteoporosis   . Right wrist fracture 1996   Past Surgical History:  Procedure Laterality Date  . HERNIA REPAIR Bilateral    inguinal  . TONSILLECTOMY     Family History  Problem Relation Age of Onset  . Cancer Mother     leukemia  . Heart disease Father     heart attack   Social History   Social History  . Marital status: Married    Spouse name: N/A  . Number of children: N/A  . Years of education: N/A   Social History Main Topics  . Smoking status: Never Smoker  . Smokeless tobacco: Never Used  . Alcohol use No  . Drug use: No  . Sexual activity: Not Asked   Other Topics Concern  . None   Social History Narrative  . None    Outpatient Encounter Prescriptions as of 04/07/2016  Medication Sig  . Cholecalciferol (VITAMIN D-3 PO) Take by mouth daily.  . Multiple Vitamin (MULTIVITAMIN) capsule Take 1 capsule by mouth daily.   No facility-administered encounter medications on file as of 04/07/2016.     Review of Systems  Constitutional: Negative for appetite change and unexpected weight change.  HENT: Negative for congestion and sinus pressure.   Eyes: Negative for pain and visual disturbance.  Respiratory: Negative for cough, chest tightness and shortness of breath.   Cardiovascular: Negative for chest pain, palpitations and leg swelling.  Gastrointestinal: Negative for abdominal pain, diarrhea, nausea and vomiting.  Genitourinary:  Negative for difficulty urinating and dysuria.  Musculoskeletal: Negative for back pain and joint swelling.  Skin: Negative for color change and rash.  Neurological: Negative for dizziness and headaches.  Hematological: Negative for adenopathy. Does not bruise/bleed easily.  Psychiatric/Behavioral: Negative for agitation and dysphoric mood.       Objective:    Physical Exam  Constitutional: He is oriented to person, place, and time. He appears well-developed and well-nourished. No distress.  HENT:  Head: Normocephalic and atraumatic.  Nose: Nose normal.  Mouth/Throat: Oropharynx is clear and moist. No oropharyngeal exudate.  Eyes: Conjunctivae are normal. Right eye exhibits no discharge. Left eye exhibits no discharge.  Neck: Neck supple. No thyromegaly present.  Cardiovascular: Normal rate and regular rhythm.   Pulmonary/Chest: Breath sounds normal. No respiratory distress. He has no wheezes.  Abdominal: Soft. Bowel sounds are normal. There is no tenderness.  Genitourinary:  Genitourinary Comments: Declined rectal exam.   Musculoskeletal: He exhibits no edema or tenderness.  Lymphadenopathy:    He has no cervical adenopathy.  Neurological: He is alert and oriented to person, place, and time.  Skin: Skin is warm and dry. No rash noted. No erythema.  Psychiatric: He has a normal mood and affect. His behavior is normal.    BP 110/70 (BP Location: Left Arm, Patient Position: Sitting, Cuff  Size: Large)   Pulse 77   Temp 97.8 F (36.6 C) (Oral)   Resp 17   Ht 5' 5.75" (1.67 m)   Wt 151 lb 8 oz (68.7 kg)   SpO2 97%   BMI 24.64 kg/m  Wt Readings from Last 3 Encounters:  04/07/16 151 lb 8 oz (68.7 kg)  10/08/15 155 lb 8 oz (70.5 kg)  05/25/15 152 lb 12.8 oz (69.3 kg)     Lab Results  Component Value Date   WBC 4.4 04/14/2015   HGB 14.9 04/14/2015   HCT 44.1 04/14/2015   PLT 192.0 04/14/2015   GLUCOSE 92 04/14/2015   CHOL 168 04/14/2015   TRIG 55.0 04/14/2015   HDL  57.60 04/14/2015   LDLCALC 99 04/14/2015   ALT 11 04/14/2015   AST 23 04/14/2015   NA 138 04/14/2015   K 4.9 04/14/2015   CL 103 04/14/2015   CREATININE 0.99 04/14/2015   BUN 19 04/14/2015   CO2 29 04/14/2015   TSH 1.54 04/14/2015   PSA 5.67 (H) 04/14/2015    Ct Head Wo Contrast  Result Date: 02/22/2016 CLINICAL DATA:  Headache and blurry vision after falling off ladder about 2 weeks ago. EXAM: CT HEAD WITHOUT CONTRAST TECHNIQUE: Contiguous axial images were obtained from the base of the skull through the vertex without intravenous contrast. COMPARISON:  12/21/2012 FINDINGS: There is no evidence for acute hemorrhage, hydrocephalus, mass lesion, or abnormal extra-axial fluid collection. No definite CT evidence for acute infarction. Diffuse loss of parenchymal volume is consistent with atrophy. Patchy low attenuation in the deep hemispheric and periventricular white matter is nonspecific, but likely reflects chronic microvascular ischemic demyelination. The visualized paranasal sinuses and mastoid air cells are clear. No evidence for skull fracture IMPRESSION: 1. Stable.  No acute intracranial abnormality. 2. Atrophy with chronic small vessel white matter ischemic demyelination. Electronically Signed   By: Misty Stanley M.D.   On: 02/22/2016 16:17       Assessment & Plan:   Problem List Items Addressed This Visit    Aortic stenosis    Has had echo previously.  Discussed f/u echo with him today.  He declines.  Doing well.        Relevant Orders   CBC with Differential/Platelet   Comprehensive metabolic panel   Lipid panel   BPH (benign prostatic hypertrophy)    Has a history of BPH.  PSA has been slightly increased.  He has declined further evaluation.  Does want me to recheck f/u psa with next labs.        Elevated PSA    Desires recheck of psa.  Has declined further evaluation or w/up.       Relevant Orders   PSA, Medicare   Health care maintenance    Physical today 04/07/16.   Colonoscopy 03/2007 - normal.  Check psa with next labs.       Osteoporosis    Has declined further treatment for his bones.  Continue weight bearing exercise.  Continue vitamin D.       Relevant Orders   TSH    Other Visit Diagnoses    Vitamin D deficiency    -  Primary   Relevant Orders   VITAMIN D 25 Hydroxy (Vit-D Deficiency, Fractures)       Einar Pheasant, MD

## 2016-04-07 NOTE — Progress Notes (Signed)
Pre-visit discussion using our clinic review tool. No additional management support is needed unless otherwise documented below in the visit note.  

## 2016-04-08 ENCOUNTER — Encounter: Payer: Self-pay | Admitting: Internal Medicine

## 2016-04-08 NOTE — Assessment & Plan Note (Signed)
Has a history of BPH.  PSA has been slightly increased.  He has declined further evaluation.  Does want me to recheck f/u psa with next labs.

## 2016-04-08 NOTE — Assessment & Plan Note (Signed)
Has had echo previously.  Discussed f/u echo with him today.  He declines.  Doing well.

## 2016-04-08 NOTE — Assessment & Plan Note (Signed)
Physical today 04/07/16.  Colonoscopy 03/2007 - normal.  Check psa with next labs.

## 2016-04-08 NOTE — Assessment & Plan Note (Signed)
Has declined further treatment for his bones.  Continue weight bearing exercise.  Continue vitamin D.

## 2016-04-08 NOTE — Assessment & Plan Note (Signed)
Desires recheck of psa.  Has declined further evaluation or w/up.

## 2016-04-29 ENCOUNTER — Other Ambulatory Visit (INDEPENDENT_AMBULATORY_CARE_PROVIDER_SITE_OTHER): Payer: Medicare Other

## 2016-04-29 DIAGNOSIS — M810 Age-related osteoporosis without current pathological fracture: Secondary | ICD-10-CM | POA: Diagnosis not present

## 2016-04-29 DIAGNOSIS — I350 Nonrheumatic aortic (valve) stenosis: Secondary | ICD-10-CM

## 2016-04-29 DIAGNOSIS — E559 Vitamin D deficiency, unspecified: Secondary | ICD-10-CM | POA: Diagnosis not present

## 2016-04-29 DIAGNOSIS — R972 Elevated prostate specific antigen [PSA]: Secondary | ICD-10-CM | POA: Diagnosis not present

## 2016-04-29 LAB — COMPREHENSIVE METABOLIC PANEL WITH GFR
ALT: 9 U/L (ref 0–53)
AST: 23 U/L (ref 0–37)
Albumin: 4.1 g/dL (ref 3.5–5.2)
Alkaline Phosphatase: 59 U/L (ref 39–117)
BUN: 24 mg/dL — ABNORMAL HIGH (ref 6–23)
CO2: 28 meq/L (ref 19–32)
Calcium: 8.8 mg/dL (ref 8.4–10.5)
Chloride: 104 meq/L (ref 96–112)
Creatinine, Ser: 1.1 mg/dL (ref 0.40–1.50)
GFR: 68.02 mL/min
Glucose, Bld: 93 mg/dL (ref 70–99)
Potassium: 4.7 meq/L (ref 3.5–5.1)
Sodium: 140 meq/L (ref 135–145)
Total Bilirubin: 0.7 mg/dL (ref 0.2–1.2)
Total Protein: 6.5 g/dL (ref 6.0–8.3)

## 2016-04-29 LAB — CBC WITH DIFFERENTIAL/PLATELET
Basophils Absolute: 0 10*3/uL (ref 0.0–0.1)
Basophils Relative: 0.9 % (ref 0.0–3.0)
Eosinophils Absolute: 0.2 10*3/uL (ref 0.0–0.7)
Eosinophils Relative: 3.9 % (ref 0.0–5.0)
HCT: 44.6 % (ref 39.0–52.0)
Hemoglobin: 15.4 g/dL (ref 13.0–17.0)
Lymphocytes Relative: 34.3 % (ref 12.0–46.0)
Lymphs Abs: 1.6 10*3/uL (ref 0.7–4.0)
MCHC: 34.5 g/dL (ref 30.0–36.0)
MCV: 88.3 fl (ref 78.0–100.0)
Monocytes Absolute: 0.4 10*3/uL (ref 0.1–1.0)
Monocytes Relative: 9.1 % (ref 3.0–12.0)
Neutro Abs: 2.4 10*3/uL (ref 1.4–7.7)
Neutrophils Relative %: 51.8 % (ref 43.0–77.0)
Platelets: 188 10*3/uL (ref 150.0–400.0)
RBC: 5.06 Mil/uL (ref 4.22–5.81)
RDW: 15 % (ref 11.5–15.5)
WBC: 4.6 10*3/uL (ref 4.0–10.5)

## 2016-04-29 LAB — VITAMIN D 25 HYDROXY (VIT D DEFICIENCY, FRACTURES): VITD: 25.48 ng/mL — ABNORMAL LOW (ref 30.00–100.00)

## 2016-04-29 LAB — LIPID PANEL
Cholesterol: 172 mg/dL (ref 0–200)
HDL: 62.4 mg/dL (ref >39.00)
LDL Cholesterol: 100 mg/dL — ABNORMAL HIGH (ref 0–99)
NonHDL: 109.9
Total CHOL/HDL Ratio: 3
Triglycerides: 52 mg/dL (ref 0.0–149.0)
VLDL: 10.4 mg/dL (ref 0.0–40.0)

## 2016-04-29 LAB — TSH: TSH: 1.25 u[IU]/mL (ref 0.35–4.50)

## 2016-04-29 LAB — PSA, MEDICARE: PSA: 5.72 ng/ml — ABNORMAL HIGH (ref 0.10–4.00)

## 2016-05-03 ENCOUNTER — Telehealth: Payer: Self-pay

## 2016-05-03 NOTE — Telephone Encounter (Signed)
Advised pt's wife as below. Wife reports she will ask if he would like to see urology. Will call back with answer. Renaldo Fiddler, CMA

## 2016-05-03 NOTE — Telephone Encounter (Signed)
-----   Message from Bevelyn Ngo, RN sent at 05/02/2016 11:05 AM EDT -----   ----- Message ----- From: Einar Pheasant, MD Sent: 05/02/2016   3:45 AM To: Leeanne Rio, CMA  Notify pt that his cholesterol levels are ok.  Vitamin D level still decreased.  Need to confirm how much vitamin D he is taking.  If not on any, start vitamin D3 1000 units per day.  If on 1000 units per day, then increase to 2000 units per day.  We will follow.  PSA still slightly increased.  He has declined urology referral.  Would still like to refer.  If agreeable now, let me know and I will place the order for the referral.  Other labs ok.

## 2016-10-13 ENCOUNTER — Encounter: Payer: Self-pay | Admitting: Internal Medicine

## 2016-10-13 ENCOUNTER — Ambulatory Visit (INDEPENDENT_AMBULATORY_CARE_PROVIDER_SITE_OTHER): Payer: Medicare HMO | Admitting: Internal Medicine

## 2016-10-13 VITALS — BP 118/62 | HR 87 | Temp 98.7°F | Ht 66.0 in | Wt 153.4 lb

## 2016-10-13 DIAGNOSIS — R972 Elevated prostate specific antigen [PSA]: Secondary | ICD-10-CM | POA: Diagnosis not present

## 2016-10-13 DIAGNOSIS — Z23 Encounter for immunization: Secondary | ICD-10-CM

## 2016-10-13 DIAGNOSIS — M810 Age-related osteoporosis without current pathological fracture: Secondary | ICD-10-CM | POA: Diagnosis not present

## 2016-10-13 DIAGNOSIS — I350 Nonrheumatic aortic (valve) stenosis: Secondary | ICD-10-CM

## 2016-10-13 NOTE — Progress Notes (Signed)
Pre-visit discussion using our clinic review tool. No additional management support is needed unless otherwise documented below in the visit note.  

## 2016-10-13 NOTE — Progress Notes (Signed)
Patient ID: Marvin Farley, male   DOB: 1932/01/15, 83 y.o.   MRN: [id number]   Subjective:    Patient ID: Marvin Farley, male    DOB: 01/26/1932, 83 y.o.   MRN: [id number]  HPI  Patient here for a scheduled follow up.  He is doing well.  Feels good.  Still working.  Stays active.  No chest pain.  No sob.  No acid reflux.  No abdominal pain or cramping.  Bowels stable.  No urine change.  Handling stress.     Past Medical History:  Diagnosis Date  . BPH (benign prostatic hypertrophy)   . Kidney stones   . Left wrist fracture 1984  . Osteoporosis   . Right wrist fracture 1996   Past Surgical History:  Procedure Laterality Date  . HERNIA REPAIR Bilateral    inguinal  . TONSILLECTOMY     Family History  Problem Relation Age of Onset  . Cancer Mother     leukemia  . Heart disease Father     heart attack   Social History   Social History  . Marital status: Married    Spouse name: N/A  . Number of children: N/A  . Years of education: N/A   Social History Main Topics  . Smoking status: Never Smoker  . Smokeless tobacco: Never Used  . Alcohol use No  . Drug use: No  . Sexual activity: Not Asked   Other Topics Concern  . None   Social History Narrative  . None    Outpatient Encounter Prescriptions as of 10/13/2016  Medication Sig  . Cholecalciferol (VITAMIN D-3 PO) Take by mouth daily.  . Multiple Vitamin (MULTIVITAMIN) capsule Take 1 capsule by mouth daily.   No facility-administered encounter medications on file as of 10/13/2016.     Review of Systems  Constitutional: Negative for appetite change and unexpected weight change.  HENT: Negative for congestion and sinus pressure.   Respiratory: Negative for cough, chest tightness and shortness of breath.   Cardiovascular: Negative for chest pain, palpitations and leg swelling.  Gastrointestinal: Negative for abdominal pain, diarrhea, nausea and vomiting.  Genitourinary: Negative for difficulty urinating and  dysuria.  Musculoskeletal: Negative for back pain and joint swelling.  Skin: Negative for color change and rash.  Neurological: Negative for dizziness, light-headedness and headaches.  Psychiatric/Behavioral: Negative for agitation and dysphoric mood.       Objective:    Physical Exam  Constitutional: He appears well-developed and well-nourished. No distress.  HENT:  Nose: Nose normal.  Mouth/Throat: Oropharynx is clear and moist.  Neck: Neck supple.  Cardiovascular: Normal rate and regular rhythm.   Pulmonary/Chest: Effort normal and breath sounds normal. No respiratory distress.  Abdominal: Soft. Bowel sounds are normal. There is no tenderness.  Musculoskeletal: He exhibits no edema or tenderness.  Lymphadenopathy:    He has no cervical adenopathy.  Skin: No rash noted. No erythema.  Psychiatric: He has a normal mood and affect. His behavior is normal.    BP 118/62 (BP Location: Left Arm, Patient Position: Sitting, Cuff Size: Large)   Pulse 87   Temp 98.7 F (37.1 C) (Oral)   Ht 5\' 6"  (1.676 m)   Wt 153 lb 6.4 oz (69.6 kg)   SpO2 96%   BMI 24.76 kg/m  Wt Readings from Last 3 Encounters:  10/13/16 153 lb 6.4 oz (69.6 kg)  04/07/16 151 lb 8 oz (68.7 kg)  10/08/15 155 lb 8 oz (70.5 kg)  Lab Results  Component Value Date   WBC 4.6 04/29/2016   HGB 15.4 04/29/2016   HCT 44.6 04/29/2016   PLT 188.0 04/29/2016   GLUCOSE 93 04/29/2016   CHOL 172 04/29/2016   TRIG 52.0 04/29/2016   HDL 62.40 04/29/2016   LDLCALC 100 (H) 04/29/2016   ALT 9 04/29/2016   AST 23 04/29/2016   NA 140 04/29/2016   K 4.7 04/29/2016   CL 104 04/29/2016   CREATININE 1.10 04/29/2016   BUN 24 (H) 04/29/2016   CO2 28 04/29/2016   TSH 1.25 04/29/2016   PSA 5.72 (H) 04/29/2016       Assessment & Plan:   Problem List Items Addressed This Visit    Aortic stenosis    Has had previous echo.  Discussed f/u echo.  He declines.  Doing well.        Elevated PSA    Declines further w/up  and evaluation.  psa just checked 5.72.        Osteoporosis    Declines further treatment.  Continue weight bearing exercise.  Discussed vitamin D supplementation.         Other Visit Diagnoses    Need for pneumococcal vaccination    -  Primary   Relevant Orders   Pneumococcal polysaccharide vaccine 23-valent greater than or equal to 2yo subcutaneous/IM (Completed)       Einar Pheasant, MD

## 2016-10-14 DIAGNOSIS — M810 Age-related osteoporosis without current pathological fracture: Secondary | ICD-10-CM | POA: Diagnosis not present

## 2016-10-14 DIAGNOSIS — R972 Elevated prostate specific antigen [PSA]: Secondary | ICD-10-CM | POA: Diagnosis not present

## 2016-10-14 DIAGNOSIS — I350 Nonrheumatic aortic (valve) stenosis: Secondary | ICD-10-CM | POA: Diagnosis not present

## 2016-10-14 DIAGNOSIS — Z23 Encounter for immunization: Secondary | ICD-10-CM | POA: Diagnosis not present

## 2016-10-23 ENCOUNTER — Encounter: Payer: Self-pay | Admitting: Internal Medicine

## 2016-10-23 NOTE — Assessment & Plan Note (Signed)
Declines further treatment.  Continue weight bearing exercise.  Discussed vitamin D supplementation.

## 2016-10-23 NOTE — Assessment & Plan Note (Signed)
Declines further w/up and evaluation.  psa just checked 5.72.

## 2016-10-23 NOTE — Assessment & Plan Note (Signed)
Has had previous echo.  Discussed f/u echo.  He declines.  Doing well.

## 2016-10-25 ENCOUNTER — Telehealth: Payer: Self-pay | Admitting: Internal Medicine

## 2016-10-25 NOTE — Telephone Encounter (Signed)
Pt wife dropped off  Forms to be filled out placed in Dr. Lars Mage colored up front. Pt wife will pick up

## 2016-10-25 NOTE — Telephone Encounter (Signed)
Have put forms in your folder one if for patient one for patient one for wife

## 2016-10-31 NOTE — Telephone Encounter (Signed)
Form completed.  On form, needs visual acuity checked.  See form.  He also needs to sign form.

## 2016-10-31 NOTE — Telephone Encounter (Signed)
[facility] form given to KB

## 2016-11-04 NOTE — Telephone Encounter (Signed)
Form completed and copied

## 2017-04-17 ENCOUNTER — Encounter: Payer: Self-pay | Admitting: Internal Medicine

## 2017-04-17 ENCOUNTER — Ambulatory Visit (INDEPENDENT_AMBULATORY_CARE_PROVIDER_SITE_OTHER): Payer: Medicare HMO | Admitting: Internal Medicine

## 2017-04-17 VITALS — BP 118/60 | HR 67 | Temp 98.6°F | Resp 12 | Ht 66.0 in | Wt 152.6 lb

## 2017-04-17 DIAGNOSIS — M810 Age-related osteoporosis without current pathological fracture: Secondary | ICD-10-CM

## 2017-04-17 DIAGNOSIS — H9192 Unspecified hearing loss, left ear: Secondary | ICD-10-CM | POA: Diagnosis not present

## 2017-04-17 DIAGNOSIS — R972 Elevated prostate specific antigen [PSA]: Secondary | ICD-10-CM

## 2017-04-17 DIAGNOSIS — Z0000 Encounter for general adult medical examination without abnormal findings: Secondary | ICD-10-CM | POA: Diagnosis not present

## 2017-04-17 DIAGNOSIS — I350 Nonrheumatic aortic (valve) stenosis: Secondary | ICD-10-CM

## 2017-04-17 NOTE — Progress Notes (Signed)
Pre-visit discussion using our clinic review tool. No additional management support is needed unless otherwise documented below in the visit note.  

## 2017-04-17 NOTE — Progress Notes (Signed)
Patient ID: YASHUA BRACCO, male   DOB: 02-04-1932, 83 y.o.   MRN: [id number]   Subjective:    Patient ID: KOLLEN ARMENTI, male    DOB: Mar 31, 1932, 83 y.o.   MRN: [id number]  HPI  Patient here for his physical.  He reports he is doing relatively well.  No chest pain.  No sob.  Stays active.  No acid reflux.  No abdominal pain.  Bowels moving.  No urine change.  Decreased hearing. Discussed hearing evaluation.  He is agreeable.  Discussed further prostate w/up.  He desires no further w/up.  Increased stress.  Increased work.  Discussed with him today.  He feels he is handling things relatively well.  Desires no further intervention.     Past Medical History:  Diagnosis Date  . BPH (benign prostatic hypertrophy)   . Kidney stones   . Left wrist fracture 1984  . Osteoporosis   . Right wrist fracture 1996   Past Surgical History:  Procedure Laterality Date  . HERNIA REPAIR Bilateral    inguinal  . TONSILLECTOMY     Family History  Problem Relation Age of Onset  . Cancer Mother        leukemia  . Heart disease Father        heart attack   Social History   Social History  . Marital status: Married    Spouse name: N/A  . Number of children: N/A  . Years of education: N/A   Social History Main Topics  . Smoking status: Never Smoker  . Smokeless tobacco: Never Used  . Alcohol use No  . Drug use: No  . Sexual activity: Not Asked   Other Topics Concern  . None   Social History Narrative  . None    Outpatient Encounter Prescriptions as of 04/17/2017  Medication Sig  . Multiple Vitamin (MULTIVITAMIN) capsule Take 1 capsule by mouth daily.  . [DISCONTINUED] Cholecalciferol (VITAMIN D-3 PO) Take by mouth daily.   No facility-administered encounter medications on file as of 04/17/2017.     Review of Systems  Constitutional: Negative for appetite change and unexpected weight change.  HENT: Negative for congestion and sinus pressure.   Eyes: Negative for pain and visual  disturbance.  Respiratory: Negative for cough, chest tightness and shortness of breath.   Cardiovascular: Negative for chest pain, palpitations and leg swelling.  Gastrointestinal: Negative for abdominal pain, diarrhea, nausea and vomiting.  Genitourinary: Negative for difficulty urinating and dysuria.  Musculoskeletal: Negative for back pain and joint swelling.  Skin: Negative for color change and rash.  Neurological: Negative for dizziness and headaches.  Hematological: Negative for adenopathy. Does not bruise/bleed easily.  Psychiatric/Behavioral: Negative for agitation and dysphoric mood.       Objective:    Physical Exam  Constitutional: He is oriented to person, place, and time. He appears well-developed and well-nourished. No distress.  HENT:  Head: Normocephalic and atraumatic.  Nose: Nose normal.  Mouth/Throat: Oropharynx is clear and moist. No oropharyngeal exudate.  Eyes: Conjunctivae are normal. Right eye exhibits no discharge. Left eye exhibits no discharge.  Neck: Neck supple. No thyromegaly present.  Cardiovascular: Normal rate and regular rhythm.   2/6 systolic murmur  Pulmonary/Chest: Breath sounds normal. No respiratory distress. He has no wheezes.  Abdominal: Soft. Bowel sounds are normal. There is no tenderness.  Genitourinary:  Genitourinary Comments: Pt declined.    Musculoskeletal: He exhibits no edema or tenderness.  Lymphadenopathy:    He has no cervical adenopathy.  Neurological: He is alert and oriented to person, place, and time.  Skin: Skin is warm and dry. No rash noted. No erythema.  Psychiatric: He has a normal mood and affect. His behavior is normal.    BP 118/60 (BP Location: Left Arm, Patient Position: Sitting, Cuff Size: Normal)   Pulse 67   Temp 98.6 F (37 C) (Oral)   Resp 12   Ht 5\' 6"  (1.676 m)   Wt 152 lb 9.6 oz (69.2 kg)   SpO2 95%   BMI 24.63 kg/m  Wt Readings from Last 3 Encounters:  04/17/17 152 lb 9.6 oz (69.2 kg)    10/13/16 153 lb 6.4 oz (69.6 kg)  04/07/16 151 lb 8 oz (68.7 kg)     Lab Results  Component Value Date   WBC 4.6 04/29/2016   HGB 15.4 04/29/2016   HCT 44.6 04/29/2016   PLT 188.0 04/29/2016   GLUCOSE 93 04/29/2016   CHOL 172 04/29/2016   TRIG 52.0 04/29/2016   HDL 62.40 04/29/2016   LDLCALC 100 (H) 04/29/2016   ALT 9 04/29/2016   AST 23 04/29/2016   NA 140 04/29/2016   K 4.7 04/29/2016   CL 104 04/29/2016   CREATININE 1.10 04/29/2016   BUN 24 (H) 04/29/2016   CO2 28 04/29/2016   TSH 1.25 04/29/2016   PSA 5.72 (H) 04/29/2016    Ct Head Wo Contrast  Result Date: 02/22/2016 CLINICAL DATA:  Headache and blurry vision after falling off ladder about 2 weeks ago. EXAM: CT HEAD WITHOUT CONTRAST TECHNIQUE: Contiguous axial images were obtained from the base of the skull through the vertex without intravenous contrast. COMPARISON:  12/21/2012 FINDINGS: There is no evidence for acute hemorrhage, hydrocephalus, mass lesion, or abnormal extra-axial fluid collection. No definite CT evidence for acute infarction. Diffuse loss of parenchymal volume is consistent with atrophy. Patchy low attenuation in the deep hemispheric and periventricular white matter is nonspecific, but likely reflects chronic microvascular ischemic demyelination. The visualized paranasal sinuses and mastoid air cells are clear. No evidence for skull fracture IMPRESSION: 1. Stable.  No acute intracranial abnormality. 2. Atrophy with chronic small vessel white matter ischemic demyelination. Electronically Signed   By: Misty Stanley M.D.   On: 02/22/2016 16:17       Assessment & Plan:   Problem List Items Addressed This Visit    Aortic stenosis    Has had previous echo.  Discussed f/u echo.  He continues to decline.  Currently asymptomatic.        Relevant Orders   Lipid panel   Comprehensive metabolic panel   CBC with Differential/Platelet   TSH   Elevated PSA    Declines further w/up and evaluation for elevated  psa.        Health care maintenance    Physical today 04/17/17.  Colonoscopy 03/2007 - normal.  Declines psa check and any further w/up.        Osteoporosis    Has declined further treatment.  Continue weight bearing exercise.  Follow vitamin D level.       Relevant Orders   VITAMIN D 25 Hydroxy (Vit-D Deficiency, Fractures)    Other Visit Diagnoses    Routine general medical examination at a health care facility    -  Primary   Hearing loss of left ear, unspecified hearing loss type       decreased hearing/hearing loss - left ear.  refer to ENT for evaluation.    Relevant Orders   Ambulatory referral  to ENT       Einar Pheasant, MD

## 2017-04-18 ENCOUNTER — Encounter: Payer: Self-pay | Admitting: Internal Medicine

## 2017-04-18 NOTE — Assessment & Plan Note (Signed)
Physical today 04/17/17.  Colonoscopy 03/2007 - normal.  Declines psa check and any further w/up.

## 2017-04-18 NOTE — Assessment & Plan Note (Signed)
Has had previous echo.  Discussed f/u echo.  He continues to decline.  Currently asymptomatic.

## 2017-04-18 NOTE — Assessment & Plan Note (Signed)
Declines further w/up and evaluation for elevated psa.

## 2017-04-18 NOTE — Assessment & Plan Note (Signed)
Has declined further treatment.  Continue weight bearing exercise.  Follow vitamin D level.

## 2017-05-12 DIAGNOSIS — H903 Sensorineural hearing loss, bilateral: Secondary | ICD-10-CM | POA: Diagnosis not present

## 2017-05-22 DIAGNOSIS — C44319 Basal cell carcinoma of skin of other parts of face: Secondary | ICD-10-CM | POA: Diagnosis not present

## 2017-05-22 DIAGNOSIS — L820 Inflamed seborrheic keratosis: Secondary | ICD-10-CM | POA: Diagnosis not present

## 2017-05-22 DIAGNOSIS — D492 Neoplasm of unspecified behavior of bone, soft tissue, and skin: Secondary | ICD-10-CM | POA: Diagnosis not present

## 2017-05-22 DIAGNOSIS — Z85828 Personal history of other malignant neoplasm of skin: Secondary | ICD-10-CM | POA: Diagnosis not present

## 2017-05-22 DIAGNOSIS — L570 Actinic keratosis: Secondary | ICD-10-CM | POA: Diagnosis not present

## 2017-06-15 DIAGNOSIS — C44319 Basal cell carcinoma of skin of other parts of face: Secondary | ICD-10-CM | POA: Diagnosis not present

## 2017-07-10 DIAGNOSIS — L570 Actinic keratosis: Secondary | ICD-10-CM | POA: Diagnosis not present

## 2017-07-10 DIAGNOSIS — C4442 Squamous cell carcinoma of skin of scalp and neck: Secondary | ICD-10-CM | POA: Diagnosis not present

## 2017-07-10 DIAGNOSIS — C44519 Basal cell carcinoma of skin of other part of trunk: Secondary | ICD-10-CM | POA: Diagnosis not present

## 2017-07-10 DIAGNOSIS — Z85828 Personal history of other malignant neoplasm of skin: Secondary | ICD-10-CM | POA: Diagnosis not present

## 2017-10-17 ENCOUNTER — Ambulatory Visit (INDEPENDENT_AMBULATORY_CARE_PROVIDER_SITE_OTHER): Payer: Medicare HMO | Admitting: Internal Medicine

## 2017-10-17 ENCOUNTER — Encounter: Payer: Self-pay | Admitting: Internal Medicine

## 2017-10-17 DIAGNOSIS — R972 Elevated prostate specific antigen [PSA]: Secondary | ICD-10-CM | POA: Diagnosis not present

## 2017-10-17 DIAGNOSIS — F439 Reaction to severe stress, unspecified: Secondary | ICD-10-CM

## 2017-10-17 DIAGNOSIS — I350 Nonrheumatic aortic (valve) stenosis: Secondary | ICD-10-CM | POA: Diagnosis not present

## 2017-10-17 DIAGNOSIS — R69 Illness, unspecified: Secondary | ICD-10-CM | POA: Diagnosis not present

## 2017-10-17 NOTE — Progress Notes (Signed)
Patient ID: Marvin Farley, male   DOB: 18-Jul-1932, 84 y.o.   MRN: [id number]   Subjective:    Patient ID: Marvin Farley, male    DOB: May 18, 1932, 84 y.o.   MRN: [id number]  HPI  Patient here for a scheduled follow up.  He reports he is doing relatively well.  Some increased stress with family issues, but overall feels he is handling things relatively well.  Discussed with him today.  Does not feel he needs any further intervention.  No chest pain.  No sob.  No acid reflux.  No abdominal pain.  Bowels moving.     Past Medical History:  Diagnosis Date  . BPH (benign prostatic hypertrophy)   . Kidney stones   . Left wrist fracture 1984  . Osteoporosis   . Right wrist fracture 1996   Past Surgical History:  Procedure Laterality Date  . HERNIA REPAIR Bilateral    inguinal  . TONSILLECTOMY     Family History  Problem Relation Age of Onset  . Cancer Mother        leukemia  . Heart disease Father        heart attack   Social History   Socioeconomic History  . Marital status: Married    Spouse name: None  . Number of children: None  . Years of education: None  . Highest education level: None  Social Needs  . Financial resource strain: None  . Food insecurity - worry: None  . Food insecurity - inability: None  . Transportation needs - medical: None  . Transportation needs - non-medical: None  Occupational History  . None  Tobacco Use  . Smoking status: Never Smoker  . Smokeless tobacco: Never Used  Substance and Sexual Activity  . Alcohol use: No    Alcohol/week: 0.0 oz  . Drug use: No  . Sexual activity: None  Other Topics Concern  . None  Social History Narrative  . None    Outpatient Encounter Medications as of 10/17/2017  Medication Sig  . Multiple Vitamin (MULTIVITAMIN) capsule Take 1 capsule by mouth daily.   No facility-administered encounter medications on file as of 10/17/2017.     Review of Systems  Constitutional: Negative for appetite change  and unexpected weight change.  HENT: Negative for congestion and sinus pressure.   Respiratory: Negative for cough, chest tightness and shortness of breath.   Cardiovascular: Negative for chest pain, palpitations and leg swelling.  Gastrointestinal: Negative for abdominal pain, diarrhea, nausea and vomiting.  Genitourinary: Negative for difficulty urinating and dysuria.  Musculoskeletal: Negative for joint swelling and myalgias.  Skin: Negative for color change and rash.  Neurological: Negative for dizziness, light-headedness and headaches.  Psychiatric/Behavioral: Negative for agitation and dysphoric mood.       Objective:    Physical Exam  Constitutional: He appears well-developed and well-nourished. No distress.  HENT:  Nose: Nose normal.  Mouth/Throat: Oropharynx is clear and moist.  Neck: Neck supple. No thyromegaly present.  Cardiovascular: Normal rate and regular rhythm.  Pulmonary/Chest: Effort normal and breath sounds normal. No respiratory distress.  Abdominal: Soft. Bowel sounds are normal. There is no tenderness.  Musculoskeletal: He exhibits no edema or tenderness.  Lymphadenopathy:    He has no cervical adenopathy.  Skin: No rash noted. No erythema.  Psychiatric: He has a normal mood and affect. His behavior is normal.    BP 128/68 (BP Location: Left Arm, Patient Position: Sitting, Cuff Size: Normal)   Pulse 89  Temp 98.5 F (36.9 C) (Oral)   Resp 20   Wt 153 lb 6.4 oz (69.6 kg)   SpO2 95%   BMI 24.76 kg/m  Wt Readings from Last 3 Encounters:  10/17/17 153 lb 6.4 oz (69.6 kg)  04/17/17 152 lb 9.6 oz (69.2 kg)  10/13/16 153 lb 6.4 oz (69.6 kg)     Lab Results  Component Value Date   WBC 4.6 04/29/2016   HGB 15.4 04/29/2016   HCT 44.6 04/29/2016   PLT 188.0 04/29/2016   GLUCOSE 93 04/29/2016   CHOL 172 04/29/2016   TRIG 52.0 04/29/2016   HDL 62.40 04/29/2016   LDLCALC 100 (H) 04/29/2016   ALT 9 04/29/2016   AST 23 04/29/2016   NA 140 04/29/2016    K 4.7 04/29/2016   CL 104 04/29/2016   CREATININE 1.10 04/29/2016   BUN 24 (H) 04/29/2016   CO2 28 04/29/2016   TSH 1.25 04/29/2016   PSA 5.72 (H) 04/29/2016    Ct Head Wo Contrast  Result Date: 02/22/2016 CLINICAL DATA:  Headache and blurry vision after falling off ladder about 2 weeks ago. EXAM: CT HEAD WITHOUT CONTRAST TECHNIQUE: Contiguous axial images were obtained from the base of the skull through the vertex without intravenous contrast. COMPARISON:  12/21/2012 FINDINGS: There is no evidence for acute hemorrhage, hydrocephalus, mass lesion, or abnormal extra-axial fluid collection. No definite CT evidence for acute infarction. Diffuse loss of parenchymal volume is consistent with atrophy. Patchy low attenuation in the deep hemispheric and periventricular white matter is nonspecific, but likely reflects chronic microvascular ischemic demyelination. The visualized paranasal sinuses and mastoid air cells are clear. No evidence for skull fracture IMPRESSION: 1. Stable.  No acute intracranial abnormality. 2. Atrophy with chronic small vessel white matter ischemic demyelination. Electronically Signed   By: Misty Stanley M.D.   On: 02/22/2016 16:17       Assessment & Plan:   Problem List Items Addressed This Visit    Aortic stenosis    Had previous echo.  Discussed f/u echo.  He continues to decline.  Currently asymptomatic.  Follow.        Elevated PSA    Continues to decline further w/up or evaluation.       Stress    Increased stress as outlined.  Discussed with him today.  He does not feel needs any further intervention.  Follow.            Einar Pheasant, MD

## 2017-10-20 ENCOUNTER — Encounter: Payer: Self-pay | Admitting: Internal Medicine

## 2017-10-20 DIAGNOSIS — F439 Reaction to severe stress, unspecified: Secondary | ICD-10-CM | POA: Insufficient documentation

## 2017-10-20 NOTE — Assessment & Plan Note (Signed)
Had previous echo.  Discussed f/u echo.  He continues to decline.  Currently asymptomatic.  Follow.

## 2017-10-20 NOTE — Assessment & Plan Note (Signed)
Continues to decline further w/up or evaluation.

## 2017-10-20 NOTE — Assessment & Plan Note (Signed)
Increased stress as outlined.  Discussed with him today.  He does not feel needs any further intervention.  Follow.   

## 2017-10-26 ENCOUNTER — Other Ambulatory Visit (INDEPENDENT_AMBULATORY_CARE_PROVIDER_SITE_OTHER): Payer: Medicare HMO

## 2017-10-26 DIAGNOSIS — M810 Age-related osteoporosis without current pathological fracture: Secondary | ICD-10-CM

## 2017-10-26 DIAGNOSIS — I350 Nonrheumatic aortic (valve) stenosis: Secondary | ICD-10-CM | POA: Diagnosis not present

## 2017-10-26 LAB — CBC WITH DIFFERENTIAL/PLATELET
Basophils Absolute: 0.1 10*3/uL (ref 0.0–0.1)
Basophils Relative: 1.1 % (ref 0.0–3.0)
Eosinophils Absolute: 0.2 10*3/uL (ref 0.0–0.7)
Eosinophils Relative: 3.2 % (ref 0.0–5.0)
HCT: 44.4 % (ref 39.0–52.0)
Hemoglobin: 15 g/dL (ref 13.0–17.0)
Lymphocytes Relative: 31 % (ref 12.0–46.0)
Lymphs Abs: 1.6 10*3/uL (ref 0.7–4.0)
MCHC: 33.8 g/dL (ref 30.0–36.0)
MCV: 90.5 fl (ref 78.0–100.0)
Monocytes Absolute: 0.4 10*3/uL (ref 0.1–1.0)
Monocytes Relative: 7.5 % (ref 3.0–12.0)
Neutro Abs: 3 10*3/uL (ref 1.4–7.7)
Neutrophils Relative %: 57.2 % (ref 43.0–77.0)
Platelets: 236 10*3/uL (ref 150.0–400.0)
RBC: 4.91 Mil/uL (ref 4.22–5.81)
RDW: 14.4 % (ref 11.5–15.5)
WBC: 5.2 10*3/uL (ref 4.0–10.5)

## 2017-10-26 LAB — COMPREHENSIVE METABOLIC PANEL WITH GFR
ALT: 9 U/L (ref 0–53)
AST: 21 U/L (ref 0–37)
Albumin: 4 g/dL (ref 3.5–5.2)
Alkaline Phosphatase: 68 U/L (ref 39–117)
BUN: 18 mg/dL (ref 6–23)
CO2: 29 meq/L (ref 19–32)
Calcium: 9.5 mg/dL (ref 8.4–10.5)
Chloride: 103 meq/L (ref 96–112)
Creatinine, Ser: 1 mg/dL (ref 0.40–1.50)
GFR: 75.65 mL/min (ref >60.00)
Glucose, Bld: 93 mg/dL (ref 70–99)
Potassium: 4.8 meq/L (ref 3.5–5.1)
Sodium: 138 meq/L (ref 135–145)
Total Bilirubin: 0.7 mg/dL (ref 0.2–1.2)
Total Protein: 7.2 g/dL (ref 6.0–8.3)

## 2017-10-26 LAB — LIPID PANEL
Cholesterol: 154 mg/dL (ref 0–200)
HDL: 59.2 mg/dL (ref >39.00)
LDL Cholesterol: 81 mg/dL (ref 0–99)
NonHDL: 95.04
Total CHOL/HDL Ratio: 3
Triglycerides: 70 mg/dL (ref 0.0–149.0)
VLDL: 14 mg/dL (ref 0.0–40.0)

## 2017-10-26 LAB — VITAMIN D 25 HYDROXY (VIT D DEFICIENCY, FRACTURES): VITD: 40.57 ng/mL (ref 30.00–100.00)

## 2017-10-26 LAB — TSH: TSH: 1.76 u[IU]/mL (ref 0.35–4.50)

## 2018-04-17 ENCOUNTER — Ambulatory Visit (INDEPENDENT_AMBULATORY_CARE_PROVIDER_SITE_OTHER): Payer: Medicare HMO | Admitting: Internal Medicine

## 2018-04-17 ENCOUNTER — Encounter: Payer: Self-pay | Admitting: Internal Medicine

## 2018-04-17 DIAGNOSIS — I350 Nonrheumatic aortic (valve) stenosis: Secondary | ICD-10-CM | POA: Diagnosis not present

## 2018-04-17 DIAGNOSIS — R69 Illness, unspecified: Secondary | ICD-10-CM | POA: Diagnosis not present

## 2018-04-17 DIAGNOSIS — F439 Reaction to severe stress, unspecified: Secondary | ICD-10-CM

## 2018-04-17 DIAGNOSIS — L989 Disorder of the skin and subcutaneous tissue, unspecified: Secondary | ICD-10-CM | POA: Diagnosis not present

## 2018-04-17 DIAGNOSIS — R972 Elevated prostate specific antigen [PSA]: Secondary | ICD-10-CM | POA: Diagnosis not present

## 2018-04-17 NOTE — Progress Notes (Signed)
Patient ID: Marvin Farley, male   DOB: 1931/11/25, 84 y.o.   MRN: [id number]   Subjective:    Patient ID: Marvin Farley, male    DOB: Aug 20, 1932, 84 y.o.   MRN: [id number]  HPI  Patient here for a scheduled follow up.  He reports he is doing relatively well.  Increased stress.  Discussed with him today.  Overall he feels he is handling things relatively well.  Does not feel needs any further intervention.  Stays active.  Works. No chest pain.  No sob.  No acid reflux.  No abdominal pain.  Bowels moving.  No urine change.  Has a persistent upper arm lesion.  Is out in the sun a lot.  Request skin check as well.     Past Medical History:  Diagnosis Date  . BPH (benign prostatic hypertrophy)   . Kidney stones   . Left wrist fracture 1984  . Osteoporosis   . Right wrist fracture 1996   Past Surgical History:  Procedure Laterality Date  . HERNIA REPAIR Bilateral    inguinal  . TONSILLECTOMY     Family History  Problem Relation Age of Onset  . Cancer Mother        leukemia  . Heart disease Father        heart attack   Social History   Socioeconomic History  . Marital status: Married    Spouse name: Not on file  . Number of children: Not on file  . Years of education: Not on file  . Highest education level: Not on file  Occupational History  . Not on file  Social Needs  . Financial resource strain: Not on file  . Food insecurity:    Worry: Not on file    Inability: Not on file  . Transportation needs:    Medical: Not on file    Non-medical: Not on file  Tobacco Use  . Smoking status: Never Smoker  . Smokeless tobacco: Never Used  Substance and Sexual Activity  . Alcohol use: No    Alcohol/week: 0.0 standard drinks  . Drug use: No  . Sexual activity: Not on file  Lifestyle  . Physical activity:    Days per week: Not on file    Minutes per session: Not on file  . Stress: Not on file  Relationships  . Social connections:    Talks on phone: Not on file   Gets together: Not on file    Attends religious service: Not on file    Active member of club or organization: Not on file    Attends meetings of clubs or organizations: Not on file    Relationship status: Not on file  Other Topics Concern  . Not on file  Social History Narrative  . Not on file    Outpatient Encounter Medications as of 04/17/2018  Medication Sig  . Multiple Vitamin (MULTIVITAMIN) capsule Take 1 capsule by mouth daily.   No facility-administered encounter medications on file as of 04/17/2018.     Review of Systems  Constitutional: Negative for appetite change and unexpected weight change.  HENT: Negative for congestion and sinus pressure.   Respiratory: Negative for cough, chest tightness and shortness of breath.   Cardiovascular: Negative for chest pain, palpitations and leg swelling.  Gastrointestinal: Negative for abdominal pain, diarrhea, nausea and vomiting.  Genitourinary: Negative for difficulty urinating and dysuria.  Musculoskeletal: Negative for joint swelling and myalgias.  Skin: Negative for color change and rash.  Neurological: Negative for dizziness, light-headedness and headaches.  Psychiatric/Behavioral: Negative for agitation and dysphoric mood.       Increased stress as outlined.        Objective:    Physical Exam  Constitutional: He appears well-developed and well-nourished. No distress.  HENT:  Nose: Nose normal.  Mouth/Throat: Oropharynx is clear and moist.  Neck: Neck supple. No thyromegaly present.  Cardiovascular: Normal rate and regular rhythm.  Pulmonary/Chest: Effort normal and breath sounds normal. No respiratory distress.  Abdominal: Soft. Bowel sounds are normal. There is no tenderness.  Musculoskeletal: He exhibits no edema or tenderness.  Lymphadenopathy:    He has no cervical adenopathy.  Skin: No rash noted. No erythema.  Psychiatric: He has a normal mood and affect. His behavior is normal.    BP 110/68   Pulse 90    Temp 98.1 F (36.7 C) (Oral)   Ht 5\' 6"  (1.676 m)   Wt 149 lb 3.2 oz (67.7 kg)   SpO2 96%   BMI 24.08 kg/m  Wt Readings from Last 3 Encounters:  04/17/18 149 lb 3.2 oz (67.7 kg)  10/17/17 153 lb 6.4 oz (69.6 kg)  04/17/17 152 lb 9.6 oz (69.2 kg)     Lab Results  Component Value Date   WBC 5.2 10/26/2017   HGB 15.0 10/26/2017   HCT 44.4 10/26/2017   PLT 236.0 10/26/2017   GLUCOSE 93 10/26/2017   CHOL 154 10/26/2017   TRIG 70.0 10/26/2017   HDL 59.20 10/26/2017   LDLCALC 81 10/26/2017   ALT 9 10/26/2017   AST 21 10/26/2017   NA 138 10/26/2017   K 4.8 10/26/2017   CL 103 10/26/2017   CREATININE 1.00 10/26/2017   BUN 18 10/26/2017   CO2 29 10/26/2017   TSH 1.76 10/26/2017   PSA 5.72 (H) 04/29/2016    Ct Head Wo Contrast  Result Date: 02/22/2016 CLINICAL DATA:  Headache and blurry vision after falling off ladder about 2 weeks ago. EXAM: CT HEAD WITHOUT CONTRAST TECHNIQUE: Contiguous axial images were obtained from the base of the skull through the vertex without intravenous contrast. COMPARISON:  12/21/2012 FINDINGS: There is no evidence for acute hemorrhage, hydrocephalus, mass lesion, or abnormal extra-axial fluid collection. No definite CT evidence for acute infarction. Diffuse loss of parenchymal volume is consistent with atrophy. Patchy low attenuation in the deep hemispheric and periventricular white matter is nonspecific, but likely reflects chronic microvascular ischemic demyelination. The visualized paranasal sinuses and mastoid air cells are clear. No evidence for skull fracture IMPRESSION: 1. Stable.  No acute intracranial abnormality. 2. Atrophy with chronic small vessel white matter ischemic demyelination. Electronically Signed   By: Misty Stanley M.D.   On: 02/22/2016 16:17       Assessment & Plan:   Problem List Items Addressed This Visit    Aortic stenosis    Discussed f/u echo.  He continues to decline.  Asymptomatic.  Follow.        Elevated PSA     Declines further testing or evaluation.  Declines f/u psa.       Skin lesion    Arm lesion as outlined.  Request skin check.  In the sun a lot.  Refer to dermatology.        Stress    Increased stress as outlined.  Discussed with him today.  He desires no further intervention.  Follow.            Einar Pheasant, MD

## 2018-04-22 ENCOUNTER — Encounter: Payer: Self-pay | Admitting: Internal Medicine

## 2018-04-22 NOTE — Assessment & Plan Note (Signed)
Increased stress as outlined.  Discussed with him today.  He desires no further intervention.  Follow.

## 2018-04-22 NOTE — Assessment & Plan Note (Signed)
Declines further testing or evaluation.  Declines f/u psa.

## 2018-04-22 NOTE — Assessment & Plan Note (Signed)
Discussed f/u echo.  He continues to decline.  Asymptomatic.  Follow.

## 2018-04-22 NOTE — Assessment & Plan Note (Signed)
Arm lesion as outlined.  Request skin check.  In the sun a lot.  Refer to dermatology.

## 2018-06-12 DIAGNOSIS — R69 Illness, unspecified: Secondary | ICD-10-CM | POA: Diagnosis not present

## 2018-08-10 ENCOUNTER — Ambulatory Visit: Payer: Self-pay

## 2018-08-16 ENCOUNTER — Ambulatory Visit: Payer: Self-pay

## 2018-08-21 ENCOUNTER — Ambulatory Visit (INDEPENDENT_AMBULATORY_CARE_PROVIDER_SITE_OTHER): Payer: Medicare HMO

## 2018-08-21 VITALS — BP 120/62 | HR 75 | Temp 97.8°F | Resp 16 | Ht 64.5 in | Wt 148.4 lb

## 2018-08-21 DIAGNOSIS — Z0000 Encounter for general adult medical examination without abnormal findings: Secondary | ICD-10-CM | POA: Diagnosis not present

## 2018-08-21 NOTE — Progress Notes (Signed)
Subjective:   Marvin Farley is a 83 y.o. male who presents for an Initial Medicare Annual Wellness Visit.  Review of Systems  No ROS.  Medicare Wellness Visit. Additional risk factors are reflected in the social history. Cardiac Risk Factors include: advanced age (>47men, >42 women);male gender    Objective:    Today's Vitals   08/21/18 0852  BP: 120/62  Pulse: 75  Resp: 16  Temp: 97.8 F (36.6 C)  TempSrc: Oral  SpO2: 95%  Weight: 148 lb 6.4 oz (67.3 kg)  Height: 5' 4.5" (1.638 m)   Body mass index is 25.08 kg/m.  Advanced Directives 08/21/2018  Does Patient Have a Medical Advance Directive? No  Does patient want to make changes to medical advance directive? No - Patient declined    Current Medications (verified) Outpatient Encounter Medications as of 08/21/2018  Medication Sig  . Multiple Vitamin (MULTIVITAMIN) capsule Take 1 capsule by mouth daily.   No facility-administered encounter medications on file as of 08/21/2018.     Allergies (verified) Codeine sulfate   History: Past Medical History:  Diagnosis Date  . BPH (benign prostatic hypertrophy)   . Kidney stones   . Left wrist fracture 1984  . Osteoporosis   . Right wrist fracture 1996   Past Surgical History:  Procedure Laterality Date  . HERNIA REPAIR Bilateral    inguinal  . TONSILLECTOMY     Family History  Problem Relation Age of Onset  . Cancer Mother        leukemia  . Heart disease Father        heart attack  . Cancer Brother        Lymphoma   Social History   Socioeconomic History  . Marital status: Married    Spouse name: Not on file  . Number of children: Not on file  . Years of education: Not on file  . Highest education level: Not on file  Occupational History  . Not on file  Social Needs  . Financial resource strain: Not hard at all  . Food insecurity:    Worry: Never true    Inability: Never true  . Transportation needs:    Medical: No    Non-medical: No    Tobacco Use  . Smoking status: Never Smoker  . Smokeless tobacco: Never Used  Substance and Sexual Activity  . Alcohol use: No    Alcohol/week: 0.0 standard drinks  . Drug use: No  . Sexual activity: Not on file  Lifestyle  . Physical activity:    Days per week: Not on file    Minutes per session: Not on file  . Stress: Not on file  Relationships  . Social connections:    Talks on phone: Not on file    Gets together: Not on file    Attends religious service: Not on file    Active member of club or organization: Not on file    Attends meetings of clubs or organizations: Not on file    Relationship status: Not on file  Other Topics Concern  . Not on file  Social History Narrative  . Not on file   Tobacco Counseling Counseling given: Not Answered   Clinical Intake:  Pre-visit preparation completed: Yes  Pain : No/denies pain     Diabetes: No  How often do you need to have someone help you when you read instructions, pamphlets, or other written materials from your doctor or pharmacy?: 1 - Never  Interpreter  Needed?: No     Activities of Daily Living In your present state of health, do you have any difficulty performing the following activities: 08/21/2018  Hearing? Y  Comment Audiology testing deferred per patient preference. R ear worse than L.   Vision? N  Difficulty concentrating or making decisions? N  Comment Age appropriate.   Walking or climbing stairs? N  Dressing or bathing? N  Doing errands, shopping? N  Preparing Food and eating ? N  Comment Wife prepares meals. Self feeds.   Using the Toilet? N  In the past six months, have you accidently leaked urine? N  Do you have problems with loss of bowel control? N  Managing your Medications? N  Managing your Finances? N  Comment Wife manages finances   Housekeeping or managing your Housekeeping? N  Comment Wife manages housekeeping  Some recent data might be hidden     Immunizations and Health  Maintenance Immunization History  Administered Date(s) Administered  . Pneumococcal Conjugate-13 04/03/2014  . Pneumococcal Polysaccharide-23 10/14/2016  . Tdap 11/17/2010   Health Maintenance Due  Topic Date Due  . TETANUS/TDAP  10/03/1952  . INFLUENZA VACCINE  04/05/2018    Patient Care Team: Einar Pheasant, MD as PCP - General (Internal Medicine)  Indicate any recent Medical Services you may have received from other than [facility] providers in the past year (date may be approximate).    Assessment:   This is a routine wellness examination for [place].  Health Screenings  Colon cancer screening- 04/16/2014 PSA-04/09/16 (5.72) Bone Density-osteoporosis (2014) Glaucoma- none reported Hearing-HOH.  R ear worse than L. Glucose- 10/26/17 (93) Cholesterol- 10/26/17 (154)  Social  Alcohol intake-none Smoking history- -none Smokers in home? none Illicit drug use? none Exercise- yard work, [employer] secretary [employer] -no  Safety  Patient feels safe at home Patient does have smoke detectors at home  Patient does wear sunscreen or protective clothing when in direct sunlight  Patient does wear seat belt when driving or riding with others.   Activities of Daily Living Patient can do their own household chores. Denies needing assistance with: driving, feeding themselves, getting from bed to chair, getting to the toilet, bathing/showering, dressing, managing money, climbing flight of stairs, or preparing meals.   Depression Screen Patient denies losing interest in daily life, feeling hopeless, or crying easily over simple problems.   Fall Screen Patient denies being afraid of falling or falling in the last year.   Memory Screen Patient denies problems with memory, misplacing items, and is able to balance checkbook/bank accounts.  Patient is alert, normal appearance, oriented to person/place/and time. Correctly identified the president of the [place],  recall of 2/3 objects, and performing simple calculations.  Patient displays appropriate judgement and can read correct time from watch face.   Immunizations The following Immunizations are up to date: Influenza, shingles, pneumonia, and tetanus.   Other Providers Patient Care Team: Einar Pheasant, MD as PCP - General (Internal Medicine)  Hearing/Vision screen Hearing Screening Comments: Patient has difficulty hearing conversational tones R ear is worse than L Audiology deferred per patient preference  Vision Screening Comments: Followed by [facility] Wears corrective lenses Vision acuity not assessed per patient preference He plans to schedule an eye exam with his opthalmologist    Dietary issues and exercise activities discussed: Current Exercise Habits: Home exercise routine, Type of exercise: walking(Yard work), Time (Minutes): 60, Frequency (Times/Week): 3, Weekly Exercise (Minutes/Week): 180, Intensity: Mild  Goals  Patient Stated   . Maintain weight (pt-stated)     Keep weight 150lb-155lb      Depression Screen PHQ 2/9 Scores 08/21/2018 10/13/2016 04/07/2016 04/06/2015  PHQ - 2 Score 0 0 0 0    Fall Risk Fall Risk  08/21/2018 10/13/2016 04/07/2016 04/06/2015 04/03/2014  Falls in the past year? 0 Yes No Yes No  Number falls in past yr: - - - 1 -  Injury with Fall? - Yes - No -  Comment - hit head and bent glasses  - - -  Risk for fall due to : - Impaired balance/gait - - -   Cognitive Function:     6CIT Screen 08/21/2018  What Year? 0 points  What month? 0 points  What time? 0 points  Count back from 20 0 points  Months in reverse 0 points  Repeat phrase 0 points  Total Score 0    Screening Tests Health Maintenance  Topic Date Due  . TETANUS/TDAP  10/03/1952  . INFLUENZA VACCINE  04/05/2018  . PNA vac Low Risk Adult  Completed      Plan:    End of life planning; Advance aging; Advanced directives discussed. Copy of current HCPOA/Living Will  requested.    Follow up as needed.  Keep all routine maintenance appointments.   I have personally reviewed and noted the following in the patient's chart:   . Medical and social history . Use of alcohol, tobacco or illicit drugs  . Current medications and supplements . Functional ability and status . Nutritional status . Physical activity . Advanced directives . List of other physicians . Hospitalizations, surgeries, and ER visits in previous 12 months . Vitals . Screenings to include cognitive, depression, and falls . Referrals and appointments  In addition, I have reviewed and discussed with patient certain preventive protocols, quality metrics, and best practice recommendations. A written personalized care plan for preventive services as well as general preventive health recommendations were provided to patient.     Varney Biles, LPN   [id number]    Reviewed above information.  Agree with assessment and plan.    Dr Nicki Reaper

## 2018-08-21 NOTE — Patient Instructions (Addendum)
  Mr. Cassada , Thank you for taking time to come for your Medicare Wellness Visit. I appreciate your ongoing commitment to your health goals. Please review the following plan we discussed and let me know if I can assist you in the future.   Follow up as needed.  Keep all routine maintenance appointments.   Bring a copy of your [place] Capo and/or Living Will to be scanned into chart upon completion.   Happy Holidays!  These are the goals we discussed: Goals      Patient Stated   . Maintain weight (pt-stated)     Keep weight 150lb-155lb       This is a list of the screening recommended for you and due dates:  Health Maintenance  Topic Date Due  . Tetanus Vaccine  10/03/1952  . Flu Shot  04/05/2018  . Pneumonia vaccines  Completed

## 2018-10-23 ENCOUNTER — Encounter: Payer: Self-pay | Admitting: Internal Medicine

## 2019-02-22 ENCOUNTER — Other Ambulatory Visit: Payer: Self-pay

## 2019-02-26 ENCOUNTER — Encounter: Payer: Self-pay | Admitting: Internal Medicine

## 2019-05-24 ENCOUNTER — Ambulatory Visit (INDEPENDENT_AMBULATORY_CARE_PROVIDER_SITE_OTHER): Payer: Medicare HMO | Admitting: Internal Medicine

## 2019-05-24 ENCOUNTER — Other Ambulatory Visit: Payer: Self-pay

## 2019-05-24 DIAGNOSIS — R059 Cough, unspecified: Secondary | ICD-10-CM

## 2019-05-24 DIAGNOSIS — R05 Cough: Secondary | ICD-10-CM | POA: Diagnosis not present

## 2019-05-24 DIAGNOSIS — J029 Acute pharyngitis, unspecified: Secondary | ICD-10-CM

## 2019-05-24 NOTE — Progress Notes (Signed)
Patient ID: Marvin Farley, male   DOB: 1931-12-08, 85 y.o.   MRN: [id number]   Virtual Visit via telephone  Note  This visit type was conducted due to national recommendations for restrictions regarding the COVID-19 pandemic (e.g. social distancing).  This format is felt to be most appropriate for this patient at this time.  All issues noted in this document were discussed and addressed.  No physical exam was performed (except for noted visual exam findings with Video Visits).   I connected with Particia Lather by telephone and verified that I am speaking with the correct person using two identifiers. Location patient: home Location provider: work  Persons participating in the telephone visit: patient, provider  I discussed the limitations, risks, security and privacy concerns of performing an evaluation and management service by telephone and the availability of in person appointments. The patient expressed understanding and agreed to proceed.   Reason for visit: work in appt  HPI: He reports that starting 05/22/19 - developed runny nose.  Progressed.  Increased sinus pressures.  Scratchy throat.  Increased drainage.  No chest congestion.  No chest tightness.  No sob.  No nausea, vomiting or diarrhea.  Taking vitamin C.  Using nasal flushes.  Feeling better.  Some cough.  Has to have covid test to go back to work.     ROS: See pertinent positives and negatives per HPI.  Past Medical History:  Diagnosis Date  . BPH (benign prostatic hypertrophy)   . Kidney stones   . Left wrist fracture 1984  . Osteoporosis   . Right wrist fracture 1996    Past Surgical History:  Procedure Laterality Date  . HERNIA REPAIR Bilateral    inguinal  . TONSILLECTOMY      Family History  Problem Relation Age of Onset  . Cancer Mother        leukemia  . Heart disease Father        heart attack  . Cancer Brother        Lymphoma    SOCIAL HX: reviewed.    Current Outpatient Medications:  Marland Kitchen   Multiple Vitamin (MULTIVITAMIN) capsule, Take 1 capsule by mouth daily., Disp: , Rfl:   EXAM:  GENERAL: alert.  Sounds to be in no acute distress.  Answering questions appropriately.    PSYCH/NEURO: pleasant and cooperative, no obvious depression or anxiety, speech and thought processing grossly intact  ASSESSMENT AND PLAN:  Discussed the following assessment and plan:  Cough Some sinus pressure, drainage and cough.  Using saline flushes. If feeling better.  No chest pain, chest tightness.  No sob.  Start nasacort nasal spray as outlined.  Robitussin DM as directed.  Check COVID.  Discussed self quarantine.  Call with update.      I discussed the assessment and treatment plan with the patient. The patient was provided an opportunity to ask questions and all were answered. The patient agreed with the plan and demonstrated an understanding of the instructions.   The patient was advised to call back or seek an in-person evaluation if the symptoms worsen or if the condition fails to improve as anticipated.  I provided 18 minutes of non-face-to-face time during this encounter.   Einar Pheasant, MD

## 2019-05-27 ENCOUNTER — Other Ambulatory Visit: Payer: Self-pay

## 2019-05-27 ENCOUNTER — Encounter: Payer: Self-pay | Admitting: Internal Medicine

## 2019-05-27 DIAGNOSIS — Z20822 Contact with and (suspected) exposure to covid-19: Secondary | ICD-10-CM

## 2019-05-27 DIAGNOSIS — R6889 Other general symptoms and signs: Secondary | ICD-10-CM | POA: Diagnosis not present

## 2019-05-27 DIAGNOSIS — R05 Cough: Secondary | ICD-10-CM | POA: Insufficient documentation

## 2019-05-27 DIAGNOSIS — R059 Cough, unspecified: Secondary | ICD-10-CM | POA: Insufficient documentation

## 2019-05-27 NOTE — Assessment & Plan Note (Signed)
Some sinus pressure, drainage and cough.  Using saline flushes. If feeling better.  No chest pain, chest tightness.  No sob.  Start nasacort nasal spray as outlined.  Robitussin DM as directed.  Check COVID.  Discussed self quarantine.  Call with update.

## 2019-05-29 ENCOUNTER — Telehealth: Payer: Self-pay | Admitting: Internal Medicine

## 2019-05-29 LAB — NOVEL CORONAVIRUS, NAA: SARS-CoV-2, NAA: NOT DETECTED

## 2019-05-29 NOTE — Telephone Encounter (Signed)
Please call and notify pt that his covid test is negative.  Please confirm pt doing ok.  Needs to continue self quarantine as we discussed.

## 2019-05-29 NOTE — Telephone Encounter (Signed)
Confirmed pt doing ok. Will self quarantine

## 2019-05-30 ENCOUNTER — Telehealth: Payer: Self-pay

## 2019-05-30 NOTE — Telephone Encounter (Signed)
Copied from [place] [id number]. Topic: General - Other >> May 30, 2019 11:43 AM Rainey Pines A wrote: Patient would like his covid results fax to [phone] attn Dr. Ronnald Collum or Ace Gins and is requesting a callback once results have been faxed

## 2019-05-31 ENCOUNTER — Telehealth: Payer: Self-pay | Admitting: Internal Medicine

## 2019-05-31 MED ORDER — AZITHROMYCIN 250 MG PO TABS
ORAL_TABLET | ORAL | 0 refills | Status: DC
Start: 2019-05-31 — End: 2019-08-22

## 2019-05-31 NOTE — Telephone Encounter (Signed)
Are you okay with sending in something for him or do you want to do a follow up with him?

## 2019-05-31 NOTE — Telephone Encounter (Signed)
Pt states Dr Nicki Reaper told him she would provide an abx for his sx if they did not resolve.  Pt states his head continues to be stopped up, once in a while he coughs, but mostly the congestion in head will not go away. Please call into  [place] [place] [facility] [place] [phone] (Phone) [phone] (Fax)

## 2019-05-31 NOTE — Telephone Encounter (Signed)
Called and spoke to pt. Still with sinus pressure and congestion.  No fever.  No sob or cough.  covid test negative.  Feels like sinus infection.  Using robitussin, nasal rinses, etc.  zpak as directed.  He has taken and tolerated.  Follow.  Call in Monday with update.  Will hold on him going back to work until symptoms improved.

## 2019-06-03 NOTE — Telephone Encounter (Signed)
Nothing further needed 

## 2019-06-03 NOTE — Telephone Encounter (Signed)
Update on pt. I have faxed over his covid results to number below.

## 2019-06-03 NOTE — Telephone Encounter (Signed)
I am glad he is doing well. Does he need anything more from me.

## 2019-06-03 NOTE — Telephone Encounter (Signed)
Copied from [place] [id number]. Topic: General - Inquiry >> Jun 03, 2019  7:12 AM Scherrie Gerlach wrote: Reason for CRM: pt states he was to check in with Dr Nicki Reaper this morning and let her know how he is. Pt states he is doing great.  Going to the chiropractor later this morning for his back, other than that things are good

## 2019-07-31 ENCOUNTER — Ambulatory Visit: Payer: Self-pay

## 2019-07-31 ENCOUNTER — Telehealth: Payer: Self-pay | Admitting: *Deleted

## 2019-07-31 NOTE — Telephone Encounter (Signed)
See other note

## 2019-07-31 NOTE — Telephone Encounter (Signed)
Pt. Reports he started having right sided back pain 1 week ago. Pt. Concerned it could be a kidney stone. Hurts with certain types of movement. Denies any fever or blood in urine or difficulty voiding. Warm transfer to [place] in the practice.  Answer Assessment - Initial Assessment Questions 1. ONSET: "When did the pain begin?"      1 week ago 2. LOCATION: "Where does it hurt?" (upper, mid or lower back)     Right flank 3. SEVERITY: "How bad is the pain?"  (e.g., Scale 1-10; mild, moderate, or severe)   - MILD (1-3): doesn't interfere with normal activities    - MODERATE (4-7): interferes with normal activities or awakens from sleep    - SEVERE (8-10): excruciating pain, unable to do any normal activities      6 4. PATTERN: "Is the pain constant?" (e.g., yes, no; constant, intermittent)      Comes and goes 5. RADIATION: "Does the pain shoot into your legs or elsewhere?"     No 6. CAUSE:  "What do you think is causing the back pain?"      Kidney stone 7. BACK OVERUSE:  "Any recent lifting of heavy objects, strenuous work or exercise?"     No 8. MEDICATIONS: "What have you taken so far for the pain?" (e.g., nothing, acetaminophen, NSAIDS)     No 9. NEUROLOGIC SYMPTOMS: "Do you have any weakness, numbness, or problems with bowel/bladder control?"     No 10. OTHER SYMPTOMS: "Do you have any other symptoms?" (e.g., fever, abdominal pain, burning with urination, blood in urine)       No 11. PREGNANCY: "Is there any chance you are pregnant?" (e.g., yes, no; LMP)       n/a  Protocols used: BACK PAIN-A-AH

## 2019-07-31 NOTE — Telephone Encounter (Signed)
Left message for patient to return call to office. [facility] nurse please triage patient.

## 2019-07-31 NOTE — Telephone Encounter (Signed)
Copied from [place] [id number]. Topic: General - Other >> Jul 31, 2019  8:33 AM Carolyn Stare wrote: Pt req an appt for left ankle swollen  and back pain that may be kidney stones

## 2019-07-31 NOTE — Telephone Encounter (Signed)
Appt on Monday scheduled.

## 2019-08-05 ENCOUNTER — Encounter: Payer: Self-pay | Admitting: Internal Medicine

## 2019-08-05 ENCOUNTER — Ambulatory Visit (INDEPENDENT_AMBULATORY_CARE_PROVIDER_SITE_OTHER): Payer: Medicare HMO | Admitting: Internal Medicine

## 2019-08-05 ENCOUNTER — Other Ambulatory Visit
Admission: RE | Admit: 2019-08-05 | Discharge: 2019-08-05 | Disposition: A | Discharge status: Home / Self Care | Payer: Medicare HMO | Source: Ambulatory Visit | Attending: Internal Medicine | Admitting: Internal Medicine

## 2019-08-05 ENCOUNTER — Other Ambulatory Visit: Payer: Self-pay

## 2019-08-05 ENCOUNTER — Ambulatory Visit
Admission: RE | Admit: 2019-08-05 | Discharge: 2019-08-05 | Disposition: A | Discharge status: Home / Self Care | Payer: Medicare HMO | Source: Ambulatory Visit | Attending: Internal Medicine | Admitting: Internal Medicine

## 2019-08-05 DIAGNOSIS — R300 Dysuria: Secondary | ICD-10-CM

## 2019-08-05 DIAGNOSIS — M546 Pain in thoracic spine: Secondary | ICD-10-CM | POA: Diagnosis not present

## 2019-08-05 DIAGNOSIS — R351 Nocturia: Secondary | ICD-10-CM

## 2019-08-05 DIAGNOSIS — R972 Elevated prostate specific antigen [PSA]: Secondary | ICD-10-CM | POA: Diagnosis not present

## 2019-08-05 DIAGNOSIS — N400 Enlarged prostate without lower urinary tract symptoms: Secondary | ICD-10-CM

## 2019-08-05 DIAGNOSIS — R109 Unspecified abdominal pain: Secondary | ICD-10-CM | POA: Diagnosis not present

## 2019-08-05 DIAGNOSIS — R21 Rash and other nonspecific skin eruption: Secondary | ICD-10-CM

## 2019-08-05 LAB — URINALYSIS, ROUTINE W REFLEX MICROSCOPIC
Bacteria, UA: NONE SEEN
Bilirubin Urine: NEGATIVE
Glucose, UA: NEGATIVE mg/dL
Ketones, ur: NEGATIVE mg/dL
Leukocytes,Ua: NEGATIVE
Nitrite: NEGATIVE
Protein, ur: NEGATIVE mg/dL
Specific Gravity, Urine: 1.011 (ref 1.005–1.030)
Squamous Epithelial / HPF: NONE SEEN (ref 0–5)
pH: 5 (ref 5.0–8.0)

## 2019-08-05 LAB — CBC WITH DIFFERENTIAL/PLATELET
Abs Immature Granulocytes: 0.01 10*3/uL (ref 0.00–0.07)
Basophils Absolute: 0.1 10*3/uL (ref 0.0–0.1)
Basophils Relative: 1 %
Eosinophils Absolute: 0.1 10*3/uL (ref 0.0–0.5)
Eosinophils Relative: 3 %
HCT: 40.7 % (ref 39.0–52.0)
Hemoglobin: 14.3 g/dL (ref 13.0–17.0)
Immature Granulocytes: 0 %
Lymphocytes Relative: 33 %
Lymphs Abs: 1.7 10*3/uL (ref 0.7–4.0)
MCH: 29.6 pg (ref 26.0–34.0)
MCHC: 35.1 g/dL (ref 30.0–36.0)
MCV: 84.3 fL (ref 80.0–100.0)
Monocytes Absolute: 0.6 10*3/uL (ref 0.1–1.0)
Monocytes Relative: 11 %
Neutro Abs: 2.6 10*3/uL (ref 1.7–7.7)
Neutrophils Relative %: 52 %
Platelets: 220 10*3/uL (ref 150–400)
RBC: 4.83 MIL/uL (ref 4.22–5.81)
RDW: 14.9 % (ref 11.5–15.5)
WBC: 5 10*3/uL (ref 4.0–10.5)
nRBC: 0 % (ref 0.0–0.2)

## 2019-08-05 LAB — COMPREHENSIVE METABOLIC PANEL
ALT: 10 U/L (ref 0–44)
AST: 22 U/L (ref 15–41)
Albumin: 4.1 g/dL (ref 3.5–5.0)
Alkaline Phosphatase: 84 U/L (ref 38–126)
Anion gap: 9 (ref 5–15)
BUN: 25 mg/dL — ABNORMAL HIGH (ref 8–23)
CO2: 23 mmol/L (ref 22–32)
Calcium: 8.7 mg/dL — ABNORMAL LOW (ref 8.9–10.3)
Chloride: 105 mmol/L (ref 98–111)
Creatinine, Ser: 1.01 mg/dL (ref 0.61–1.24)
GFR calc Af Amer: >60 mL/min (ref >60)
GFR calc non Af Amer: >60 mL/min (ref >60)
Glucose, Bld: 84 mg/dL (ref 70–99)
Potassium: 4.3 mmol/L (ref 3.5–5.1)
Sodium: 137 mmol/L (ref 135–145)
Total Bilirubin: 0.5 mg/dL (ref 0.3–1.2)
Total Protein: 7.2 g/dL (ref 6.5–8.1)

## 2019-08-05 LAB — PSA: Prostatic Specific Antigen: 7.25 ng/mL — ABNORMAL HIGH (ref 0.00–4.00)

## 2019-08-05 NOTE — Progress Notes (Signed)
Patient thinks that he has a possible kidney stone.  Pt said that he had one before.  Pt c/o bilateral back pain.  Pt also c/o having inflammation on the end of his penis and feels that it may be related to his back pain.  Pt denied having any burning, pain or visible blood.  Pt is requesting that an MRI be ordered.

## 2019-08-05 NOTE — Progress Notes (Signed)
Patient ID: Marvin Farley, male   DOB: May 11, 1932, 85 y.o.   MRN: [id number]   Virtual Visit via telephone Note  This visit type was conducted due to national recommendations for restrictions regarding the COVID-19 pandemic (e.g. social distancing).  This format is felt to be most appropriate for this patient at this time.  All issues noted in this document were discussed and addressed.  No physical exam was performed (except for noted visual exam findings with Video Visits).   I connected with Marvin Farley by a video telephone and verified that I am speaking with the correct person using two identifiers. Location patient: home Location provider: work Persons participating in the telephone visit: patient, provider  I discussed the limitations, risks, security and privacy concerns of performing an evaluation and management service by telephone and the availability of in person appointments.  The patient expressed understanding and agreed to proceed.   Reason for visit: work in appt  HPI: Reports some increased back pain.  States at first he thought he was having - kidney stone.  On questioning him, he reports back pain - localized to thoracic region - mid back.  Reports increased discomfort with twisting to the right and to the left.  Pain goes across his mid back with twisting.  Certain positions aggravate.  He also reports nocturia - getting up 5-6x/night.  No hematuria.  He also reports when he urinates, just a small amount of urine produced.  No fever. Eating and drinking.  No nausea or vomiting.  No sob.  For the last 4-6 weeks, has noticed tip of penis red.  States is intermittent, but over the last week, more red.  Some burning with urination.  Has noticed left heel/ankle - "swollen".  Worse when first gets up.  Describes more discomfort when first gets up instead of actual swelling.  Pain into heel.  States had similar symptoms when had plantar fasciitis previously.  No redness.  No leg or  calf swelling.     ROS: See pertinent positives and negatives per HPI.  Past Medical History:  Diagnosis Date  . BPH (benign prostatic hypertrophy)   . Kidney stones   . Left wrist fracture 1984  . Osteoporosis   . Right wrist fracture 1996    Past Surgical History:  Procedure Laterality Date  . HERNIA REPAIR Bilateral    inguinal  . TONSILLECTOMY      Family History  Problem Relation Age of Onset  . Cancer Mother        leukemia  . Heart disease Father        heart attack  . Cancer Brother        Lymphoma    SOCIAL HX: reviewed.    Current Outpatient Medications:  .  Ascorbic Acid (VITAMIN C) 500 MG CAPS, Take by mouth 2 (two) times daily., Disp: , Rfl:  .  Multiple Vitamin (MULTIVITAMIN) capsule, Take 1 capsule by mouth daily., Disp: , Rfl:  .  azithromycin (ZITHROMAX) 250 MG tablet, Take two tablets x 1 day and then one tablet per day for four more days. (Patient not taking: Reported on 08/05/2019), Disp: 6 tablet, Rfl: 0  EXAM:  GENERAL: alert. Sounds to be in no acute distress.  Answering questions appropriately.   PSYCH/NEURO: pleasant and cooperative, no obvious depression or anxiety, speech and thought processing grossly intact  ASSESSMENT AND PLAN:  Discussed the following assessment and plan:  Back pain, thoracic Back pain as outlined.  Appears to  be more msk in origin.  Worse with certain positions and with twisting, etc.  Will check thoracic spine xray.  Denies recent trauma.    BPH with elevated PSA Has a history of BPH.  PSA has been elevated.  Now with increased nocturia and some hesitancy with decreased urine.  Exam limited given visit virtual.  Check urine, given some dysuria.  Check kidney function and routine labs.  Check KUB.    Dysuria Check urine to confirm no infection.    Nocturia Check urine and kidney function as outlined.    Rash of penis Described tip of penis - red.  Intermittent.  Worse over the last week.  Question - yeast.   Discussed topical nystatin.  Follow.      I discussed the assessment and treatment plan with the patient. The patient was provided an opportunity to ask questions and all were answered. The patient agreed with the plan and demonstrated an understanding of the instructions.   The patient was advised to call back or seek an in-person evaluation if the symptoms worsen or if the condition fails to improve as anticipated.  I provided 21 minutes of non-face-to-face time during this encounter.   Einar Pheasant, MD

## 2019-08-06 LAB — URINE CULTURE: Culture: NO GROWTH

## 2019-08-07 ENCOUNTER — Encounter: Payer: Self-pay | Admitting: Internal Medicine

## 2019-08-07 ENCOUNTER — Telehealth: Payer: Self-pay | Admitting: *Deleted

## 2019-08-07 DIAGNOSIS — M546 Pain in thoracic spine: Secondary | ICD-10-CM

## 2019-08-07 DIAGNOSIS — R21 Rash and other nonspecific skin eruption: Secondary | ICD-10-CM | POA: Insufficient documentation

## 2019-08-07 NOTE — Telephone Encounter (Signed)
Copied from [place] [id number]. Topic: General - Inquiry >> Aug 07, 2019  8:35 AM Virl Axe D wrote: Reason for CRM: Pt is requesting CB from Dr. Bary Leriche nurse with results from labs/xray from 08/05/19. Please advise.

## 2019-08-07 NOTE — Assessment & Plan Note (Addendum)
Has a history of BPH.  PSA has been elevated.  Now with increased nocturia and some hesitancy with decreased urine.  Exam limited given visit virtual.  Check urine, given some dysuria.  Check kidney function and routine labs.  Check KUB.

## 2019-08-07 NOTE — Assessment & Plan Note (Signed)
Described tip of penis - red.  Intermittent.  Worse over the last week.  Question - yeast.  Discussed topical nystatin.  Follow.

## 2019-08-07 NOTE — Assessment & Plan Note (Signed)
Check urine and kidney function as outlined.

## 2019-08-07 NOTE — Assessment & Plan Note (Signed)
Check urine to confirm no infection.  

## 2019-08-07 NOTE — Assessment & Plan Note (Signed)
Back pain as outlined.  Appears to be more msk in origin.  Worse with certain positions and with twisting, etc.  Will check thoracic spine xray.  Denies recent trauma.

## 2019-08-07 NOTE — Telephone Encounter (Signed)
PT HAS CALLED BACK AFTER MISSING CALL. WILL NOW BE AT HIS PHONE [phone]

## 2019-08-09 MED ORDER — SILODOSIN 4 MG PO CAPS: 4.0000 mg | ORAL_CAPSULE | Freq: Every day | ORAL | 1 refills | Status: DC

## 2019-08-09 NOTE — Telephone Encounter (Signed)
Pt called stating he missed the first call. Please advise.

## 2019-08-09 NOTE — Telephone Encounter (Signed)
Left detailed message for patient letting him know that I would call with results once resulted. He had his labs drawn at the hospital so I wasn't sure if you had seen them or if they were sent directly to you.

## 2019-08-09 NOTE — Telephone Encounter (Signed)
Had tried to call pt.  Left messages.  Called pt at home this pm.  Notified of lab results and xray results.  He is agreeable to ortho referral regarding his back.  Also, start rapaflo 4mg  q day.  Discussed referral to urology for elevated psa and for his urinary symptoms.  He wants to hold at this time.  Will notify me if he changes his mind.  Order placed for ortho referral.

## 2019-08-22 ENCOUNTER — Other Ambulatory Visit: Payer: Self-pay

## 2019-08-22 ENCOUNTER — Ambulatory Visit: Payer: Self-pay

## 2019-08-22 ENCOUNTER — Ambulatory Visit (INDEPENDENT_AMBULATORY_CARE_PROVIDER_SITE_OTHER): Payer: Medicare HMO | Admitting: Internal Medicine

## 2019-08-22 ENCOUNTER — Encounter: Payer: Self-pay | Admitting: Internal Medicine

## 2019-08-22 VITALS — Ht 66.5 in | Wt 148.0 lb

## 2019-08-22 DIAGNOSIS — M810 Age-related osteoporosis without current pathological fracture: Secondary | ICD-10-CM | POA: Diagnosis not present

## 2019-08-22 DIAGNOSIS — M546 Pain in thoracic spine: Secondary | ICD-10-CM | POA: Diagnosis not present

## 2019-08-22 DIAGNOSIS — R972 Elevated prostate specific antigen [PSA]: Secondary | ICD-10-CM

## 2019-08-22 DIAGNOSIS — R3911 Hesitancy of micturition: Secondary | ICD-10-CM

## 2019-08-22 DIAGNOSIS — I350 Nonrheumatic aortic (valve) stenosis: Secondary | ICD-10-CM

## 2019-08-22 DIAGNOSIS — N400 Enlarged prostate without lower urinary tract symptoms: Secondary | ICD-10-CM | POA: Diagnosis not present

## 2019-08-22 NOTE — Progress Notes (Addendum)
Patient ID: Marvin Farley, male   DOB: 06-15-1932, 85 y.o.   MRN: [id number]   Virtual Visit via telephone  Note  This visit type was conducted due to national recommendations for restrictions regarding the COVID-19 pandemic (e.g. social distancing).  This format is felt to be most appropriate for this patient at this time.  All issues noted in this document were discussed and addressed.  No physical exam was performed (except for noted visual exam findings with Video Visits).   I connected with Marvin Farley by telephone and verified that I am speaking with the correct person using two identifiers. Location patient: home Location provider: work  Persons participating in the telephone visit: patient, provider  I discussed the limitations, risks, security and privacy concerns of performing an evaluation and management service by telephone and the availability of in person appointments. The patient expressed understanding and agreed to proceed.   Reason for visit: scheduled follow up.   HPI: He reports he is feeling better.  Last visit was having problems with urination.  Started on rapaflo.  Does feel some better, but still with persistent issues - slow urination.  Has a history of elevated psa.  Given symptoms discussed referral to urology.  He has declined previously.  Agrees today.  Planning to see Dr Lacinda Axon for his back.  Found to have compression fracture.  Not as much back pain, but does have persistent pain.  Discussed bone density.  He is agreeable.  No chest pain.  No sob.  No acid reflux.  No abdominal pain.  No bowel change.  Penis no longer red.     ROS: See pertinent positives and negatives per HPI.  Past Medical History:  Diagnosis Date  . BPH (benign prostatic hypertrophy)   . Kidney stones   . Left wrist fracture 1984  . Osteoporosis   . Right wrist fracture 1996    Past Surgical History:  Procedure Laterality Date  . HERNIA REPAIR Bilateral    inguinal  .  TONSILLECTOMY      Family History  Problem Relation Age of Onset  . Cancer Mother        leukemia  . Heart disease Father        heart attack  . Cancer Brother        Lymphoma    SOCIAL HX: reviewed.    Current Outpatient Medications:  .  Ascorbic Acid (VITAMIN C) 500 MG CAPS, Take by mouth 2 (two) times daily., Disp: , Rfl:  .  cholecalciferol (VITAMIN D3) 25 MCG (1000 UT) tablet, Take 1,000 Units by mouth daily., Disp: , Rfl:  .  Multiple Vitamin (MULTIVITAMIN) capsule, Take 1 capsule by mouth daily., Disp: , Rfl:  .  silodosin (RAPAFLO) 4 MG CAPS capsule, Take 1 capsule (4 mg total) by mouth daily with breakfast., Disp: 30 capsule, Rfl: 1  EXAM:  GENERAL: alert.  Sounds to be in no acute distress.  Answering questions appropriately.    PSYCH/NEURO: pleasant and cooperative, no obvious depression or anxiety, speech and thought processing grossly intact  ASSESSMENT AND PLAN:  Discussed the following assessment and plan:  Aortic stenosis Discussed f/u echo.  He declines.  Currently asymptomatic.  Follow.   Back pain, thoracic Back pain - persistent.  Xray with compression fracture.  Planning to see Dr Lacinda Axon.    BPH with elevated PSA On rapaflo.  Symptoms have improved some.  Persistent hesitancy - refer to urology.    Elevated PSA Planning to  see urology. Has previously declined.   Osteoporosis Has declined further treatment previously.  Compression fracture as outlined.  Schedule bone density.      I discussed the assessment and treatment plan with the patient. The patient was provided an opportunity to ask questions and all were answered. The patient agreed with the plan and demonstrated an understanding of the instructions.   The patient was advised to call back or seek an in-person evaluation if the symptoms worsen or if the condition fails to improve as anticipated.  I provided 15 minutes of non-face-to-face time during this encounter.   Einar Pheasant, MD

## 2019-08-25 ENCOUNTER — Encounter: Payer: Self-pay | Admitting: Internal Medicine

## 2019-08-25 NOTE — Assessment & Plan Note (Signed)
Has declined further treatment previously.  Compression fracture as outlined.  Schedule bone density.

## 2019-08-25 NOTE — Addendum Note (Signed)
Addended by: Alisa Graff on: 08/25/2019 07:45 PM   Modules accepted: Orders

## 2019-08-25 NOTE — Assessment & Plan Note (Signed)
Discussed f/u echo.  He declines.  Currently asymptomatic.  Follow.

## 2019-08-25 NOTE — Assessment & Plan Note (Signed)
On rapaflo.  Symptoms have improved some.  Persistent hesitancy - refer to urology.

## 2019-08-25 NOTE — Assessment & Plan Note (Signed)
Back pain - persistent.  Xray with compression fracture.  Planning to see Dr Lacinda Axon.

## 2019-08-25 NOTE — Assessment & Plan Note (Addendum)
Planning to see urology. Has previously declined.

## 2019-08-26 ENCOUNTER — Ambulatory Visit (INDEPENDENT_AMBULATORY_CARE_PROVIDER_SITE_OTHER): Payer: Medicare HMO

## 2019-08-26 ENCOUNTER — Other Ambulatory Visit: Payer: Self-pay

## 2019-08-26 VITALS — Ht 66.5 in | Wt 148.0 lb

## 2019-08-26 DIAGNOSIS — Z0000 Encounter for general adult medical examination without abnormal findings: Secondary | ICD-10-CM

## 2019-08-26 NOTE — Patient Instructions (Addendum)
  Mr. Wertman , Thank you for taking time to come for your Medicare Wellness Visit. I appreciate your ongoing commitment to your health goals. Please review the following plan we discussed and let me know if I can assist you in the future.   These are the goals we discussed: Goals      Patient Stated   . Maintain weight (pt-stated)     Keep weight 150lb-155lb       This is a list of the screening recommended for you and due dates:  Health Maintenance  Topic Date Due  . Flu Shot  12/04/2019*  . Tetanus Vaccine  11/16/2020  . Pneumonia vaccines  Completed  *Topic was postponed. The date shown is not the original due date.

## 2019-08-26 NOTE — Progress Notes (Addendum)
Subjective:   Marvin Farley is a 85 y.o. male who presents for Medicare Annual/Subsequent preventive examination.  Review of Systems:  No ROS.  Medicare Wellness Virtual Visit.  Visual/audio telehealth visit, UTA vital signs.   See social history for additional risk factors.   Cardiac Risk Factors include: advanced age (>67men, >53 women);male gender     Objective:    Vitals: Ht 5' 6.5" (1.689 m)   Wt 148 lb (67.1 kg)   BMI 23.53 kg/m   Body mass index is 23.53 kg/m.  Advanced Directives 08/26/2019 08/21/2018  Does Patient Have a Medical Advance Directive? No No  Does patient want to make changes to medical advance directive? - No - Patient declined  Would patient like information on creating a medical advance directive? No - Patient declined -    Tobacco Social History   Tobacco Use  Smoking Status Never Smoker  Smokeless Tobacco Never Used     Counseling given: Not Answered   Clinical Intake:  Pre-visit preparation completed: Yes        Diabetes: No  How often do you need to have someone help you when you read instructions, pamphlets, or other written materials from your doctor or pharmacy?: 1 - Never  Interpreter Needed?: No     Past Medical History:  Diagnosis Date  . BPH (benign prostatic hypertrophy)   . Kidney stones   . Left wrist fracture 1984  . Osteoporosis   . Right wrist fracture 1996   Past Surgical History:  Procedure Laterality Date  . HERNIA REPAIR Bilateral    inguinal  . TONSILLECTOMY     Family History  Problem Relation Age of Onset  . Cancer Mother        leukemia  . Heart disease Father        heart attack  . Cancer Brother        Lymphoma   Social History   Socioeconomic History  . Marital status: Married    Spouse name: Not on file  . Number of children: Not on file  . Years of education: Not on file  . Highest education level: Not on file  Occupational History  . Not on file  Tobacco Use  . Smoking  status: Never Smoker  . Smokeless tobacco: Never Used  Substance and Sexual Activity  . Alcohol use: No    Alcohol/week: 0.0 standard drinks  . Drug use: No  . Sexual activity: Not on file  Other Topics Concern  . Not on file  Social History Narrative  . Not on file   Social Determinants of Health   Financial Resource Strain:   . Difficulty of Paying Living Expenses: Not on file  Food Insecurity:   . Worried About Charity fundraiser in the Last Year: Not on file  . Ran Out of Food in the Last Year: Not on file  Transportation Needs: No Transportation Needs  . Lack of Transportation (Medical): No  . Lack of Transportation (Non-Medical): No  Physical Activity: Insufficiently Active  . Days of Exercise per Week: 4 days  . Minutes of Exercise per Session: 30 min  Stress:   . Feeling of Stress : Not on file  Social Connections:   . Frequency of Communication with Friends and Family: Not on file  . Frequency of Social Gatherings with Friends and Family: Not on file  . Attends Religious Services: Not on file  . Active Member of Clubs or Organizations: Not on file  .  Attends Archivist Meetings: Not on file  . Marital Status: Not on file    Outpatient Encounter Medications as of 08/26/2019  Medication Sig  . Ascorbic Acid (VITAMIN C) 500 MG CAPS Take by mouth 2 (two) times daily.  . cholecalciferol (VITAMIN D3) 25 MCG (1000 UT) tablet Take 1,000 Units by mouth daily.  . Multiple Vitamin (MULTIVITAMIN) capsule Take 1 capsule by mouth daily.  . silodosin (RAPAFLO) 4 MG CAPS capsule Take 1 capsule (4 mg total) by mouth daily with breakfast.   No facility-administered encounter medications on file as of 08/26/2019.    Activities of Daily Living In your present state of health, do you have any difficulty performing the following activities: 08/26/2019  Hearing? N  Vision? N  Difficulty concentrating or making decisions? N  Walking or climbing stairs? N  Dressing or  bathing? N  Doing errands, shopping? N  Preparing Food and eating ? N  Using the Toilet? N  In the past six months, have you accidently leaked urine? N  Do you have problems with loss of bowel control? N  Managing your Medications? N  Managing your Finances? N  Housekeeping or managing your Housekeeping? N  Some recent data might be hidden    Patient Care Team: Einar Pheasant, MD as PCP - General (Internal Medicine)   Assessment:   This is a routine wellness examination for Marvin Farley.  Nurse connected with patient 08/26/19 at 10:30 AM EST by a telephone enabled telemedicine application and verified that I am speaking with the correct person using two identifiers. Patient stated full name and DOB. Patient gave permission to continue with virtual visit. Patient's location was at home and Nurse's location was at [place] office.   Patient is alert and oriented x3. Patient denies difficulty focusing or concentrating. Patient likes to read for brain stimulation.  Health Maintenance Due: See completed HM at the end of note.   Eye: Visual acuity not assessed. Virtual visit. Followed by their ophthalmologist.  Dental: UTD  Hearing: Demonstrates normal hearing during visit.  Safety:  Patient feels safe at home- yes Patient does have smoke detectors at home- yes Patient does wear sunscreen or protective clothing when in direct sunlight - yes Patient does wear seat belt when in a moving vehicle - yes Patient drives- yes Adequate lighting in walkways free from debris- yes Grab bars and handrails used as appropriate- yes Ambulates with an assistive device- no Cell phone on person when ambulating outside of the home- yes  Social: Alcohol intake - no  Smoking history- never Smokers in home? none Illicit drug use? none  Medication: Taking as directed and without issues.  Self managed - yes   Covid-19: Precautions and sickness symptoms discussed. Wears mask, social distancing,  hand hygiene as appropriate.   Activities of Daily Living Patient denies needing assistance with: household chores, feeding themselves, getting from bed to chair, getting to the toilet, bathing/showering, dressing, managing money, or preparing meals.   Discussed the importance of a healthy diet, water intake and the benefits of aerobic exercise.   Physical activity- active around the home, no routine  Diet:  Regular Water: fair intake Caffeine: 1 cup of coffee  Other Providers Patient Care Team: Einar Pheasant, MD as PCP - General (Internal Medicine) Exercise Activities and Dietary recommendations Current Exercise Habits: Home exercise routine  Goals      Patient Stated   . Maintain weight (pt-stated)     Keep weight 150lb-155lb  Fall Risk Fall Risk  08/26/2019 08/22/2019 08/05/2019 08/21/2018 10/13/2016  Falls in the past year? 0 0 0 0 Yes  Number falls in past yr: - - 0 - -  Injury with Fall? - - - - Yes  Comment - - - - hit head and bent glasses   Risk for fall due to : - - - - Impaired balance/gait  Follow up Falls prevention discussed Falls evaluation completed Falls evaluation completed - -   Timed Get Up and Go Performed: no, virtual visit  Depression Screen PHQ 2/9 Scores 08/26/2019 08/05/2019 08/21/2018 10/13/2016  PHQ - 2 Score 0 0 0 0    Cognitive Function     6CIT Screen 08/26/2019 08/21/2018  What Year? 0 points 0 points  What month? 0 points 0 points  What time? 0 points 0 points  Count back from 20 0 points 0 points  Months in reverse 0 points 0 points  Repeat phrase 0 points 0 points  Total Score 0 0    Immunization History  Administered Date(s) Administered  . Pneumococcal Conjugate-13 04/03/2014  . Pneumococcal Polysaccharide-23 10/14/2016  . Tdap 11/17/2010   Screening Tests Health Maintenance  Topic Date Due  . INFLUENZA VACCINE  12/04/2019 (Originally 04/06/2019)  . TETANUS/TDAP  11/16/2020  . PNA vac Low Risk Adult  Completed         Plan:   Keep all routine maintenance appointments.   Follow up 12/26/19 @930   Medicare Attestation I have personally reviewed: The patient's medical and social history Their use of alcohol, tobacco or illicit drugs Their current medications and supplements The patient's functional ability including ADLs,fall risks, home safety risks, cognitive, and hearing and visual impairment Diet and physical activities Evidence for depression   I have reviewed and discussed with patient certain preventive protocols, quality metrics, and best practice recommendations.   Varney Biles, LPN  [id number]   Reviewed above information.  Agree with assessment and plan.    Dr Nicki Reaper

## 2019-09-02 ENCOUNTER — Encounter: Payer: Medicare HMO | Admitting: Internal Medicine

## 2019-09-03 DIAGNOSIS — M546 Pain in thoracic spine: Secondary | ICD-10-CM | POA: Diagnosis not present

## 2019-09-11 ENCOUNTER — Other Ambulatory Visit: Payer: Self-pay | Admitting: Internal Medicine

## 2019-09-17 ENCOUNTER — Encounter: Payer: Self-pay | Admitting: Internal Medicine

## 2019-09-30 ENCOUNTER — Ambulatory Visit: Payer: Self-pay | Admitting: Urology

## 2019-10-14 ENCOUNTER — Other Ambulatory Visit: Payer: Self-pay

## 2019-10-14 ENCOUNTER — Telehealth: Payer: Self-pay | Admitting: Internal Medicine

## 2019-10-14 MED ORDER — SILODOSIN 4 MG PO CAPS: ORAL_CAPSULE | ORAL | 1 refills | Status: DC

## 2019-10-14 NOTE — Telephone Encounter (Signed)
Pt needs silodosin called in to [facility]. Pt has enough medicine for 1 week.

## 2019-12-26 ENCOUNTER — Encounter: Payer: Self-pay | Admitting: Internal Medicine

## 2019-12-26 ENCOUNTER — Other Ambulatory Visit: Payer: Self-pay

## 2019-12-26 ENCOUNTER — Ambulatory Visit (INDEPENDENT_AMBULATORY_CARE_PROVIDER_SITE_OTHER): Payer: Medicare HMO | Admitting: Internal Medicine

## 2019-12-26 VITALS — BP 126/66 | HR 74 | Temp 98.4°F | Resp 16 | Ht 67.0 in | Wt 149.8 lb

## 2019-12-26 DIAGNOSIS — R972 Elevated prostate specific antigen [PSA]: Secondary | ICD-10-CM

## 2019-12-26 DIAGNOSIS — N400 Enlarged prostate without lower urinary tract symptoms: Secondary | ICD-10-CM

## 2019-12-26 DIAGNOSIS — M546 Pain in thoracic spine: Secondary | ICD-10-CM | POA: Diagnosis not present

## 2019-12-26 DIAGNOSIS — R351 Nocturia: Secondary | ICD-10-CM

## 2019-12-26 DIAGNOSIS — I350 Nonrheumatic aortic (valve) stenosis: Secondary | ICD-10-CM | POA: Diagnosis not present

## 2019-12-26 DIAGNOSIS — M810 Age-related osteoporosis without current pathological fracture: Secondary | ICD-10-CM | POA: Diagnosis not present

## 2019-12-26 DIAGNOSIS — E559 Vitamin D deficiency, unspecified: Secondary | ICD-10-CM

## 2019-12-26 LAB — COMPREHENSIVE METABOLIC PANEL
ALT: 9 U/L (ref 0–53)
AST: 23 U/L (ref 0–37)
Albumin: 4 g/dL (ref 3.5–5.2)
Alkaline Phosphatase: 63 U/L (ref 39–117)
BUN: 17 mg/dL (ref 6–23)
CO2: 27 mEq/L (ref 19–32)
Calcium: 8.9 mg/dL (ref 8.4–10.5)
Chloride: 104 mEq/L (ref 96–112)
Creatinine, Ser: 0.97 mg/dL (ref 0.40–1.50)
GFR: 73.34 mL/min (ref >60.00)
Glucose, Bld: 85 mg/dL (ref 70–99)
Potassium: 4.6 mEq/L (ref 3.5–5.1)
Sodium: 138 mEq/L (ref 135–145)
Total Bilirubin: 0.5 mg/dL (ref 0.2–1.2)
Total Protein: 6.3 g/dL (ref 6.0–8.3)

## 2019-12-26 LAB — LIPID PANEL
Cholesterol: 159 mg/dL (ref 0–200)
HDL: 61.4 mg/dL (ref >39.00)
LDL Cholesterol: 76 mg/dL (ref 0–99)
NonHDL: 98.07
Total CHOL/HDL Ratio: 3
Triglycerides: 112 mg/dL (ref 0.0–149.0)
VLDL: 22.4 mg/dL (ref 0.0–40.0)

## 2019-12-26 LAB — PSA, MEDICARE: PSA: 6.18 ng/ml — ABNORMAL HIGH (ref 0.10–4.00)

## 2019-12-26 LAB — TSH: TSH: 1.28 u[IU]/mL (ref 0.35–4.50)

## 2019-12-26 LAB — VITAMIN D 25 HYDROXY (VIT D DEFICIENCY, FRACTURES): VITD: 32.88 ng/mL (ref 30.00–100.00)

## 2019-12-26 NOTE — Progress Notes (Signed)
Patient ID: Marvin Farley, male   DOB: 29-Aug-1932, 86 y.o.   MRN: [id number]   Subjective:    Patient ID: Marvin Farley, male    DOB: 19-Jul-1932, 86 y.o.   MRN: [id number]  HPI This visit occurred during the SARS-CoV-2 public health emergency.  Safety protocols were in place, including screening questions prior to the visit, additional usage of staff PPE, and extensive cleaning of exam room while observing appropriate contact time as indicated for disinfecting solutions.  Patient here for a scheduled follow up. He reports he is doing relatively well.  Still working.  Denies any chest pain or sob with increased activity or exertion.  Eating.  No nausea or vomiting.  Bowels moving.  Back pain is better.  Recent xray revealed compression fracture.  Saw NSU.  Elected to monitor since symptoms improved after starting rapaflo.  Was found to have elevated psa. Since symptoms improved, he declined further evaluation and declined referral to urology.  Discussed today.  Nocturia - x 1.  Much improved.  Some left shoulder discomfort.  Desires to monitor.    Past Medical History:  Diagnosis Date  . BPH (benign prostatic hypertrophy)   . Kidney stones   . Left wrist fracture 1984  . Osteoporosis   . Right wrist fracture 1996   Past Surgical History:  Procedure Laterality Date  . HERNIA REPAIR Bilateral    inguinal  . TONSILLECTOMY     Family History  Problem Relation Age of Onset  . Cancer Mother        leukemia  . Heart disease Father        heart attack  . Cancer Brother        Lymphoma   Social History   Socioeconomic History  . Marital status: Married    Spouse name: Not on file  . Number of children: Not on file  . Years of education: Not on file  . Highest education level: Not on file  Occupational History  . Not on file  Tobacco Use  . Smoking status: Never Smoker  . Smokeless tobacco: Never Used  Substance and Sexual Activity  . Alcohol use: No    Alcohol/week: 0.0  standard drinks  . Drug use: No  . Sexual activity: Not on file  Other Topics Concern  . Not on file  Social History Narrative  . Not on file   Social Determinants of Health   Financial Resource Strain:   . Difficulty of Paying Living Expenses:   Food Insecurity:   . Worried About Charity fundraiser in the Last Year:   . Arboriculturist in the Last Year:   Transportation Needs: No Transportation Needs  . Lack of Transportation (Medical): No  . Lack of Transportation (Non-Medical): No  Physical Activity: Insufficiently Active  . Days of Exercise per Week: 4 days  . Minutes of Exercise per Session: 30 min  Stress:   . Feeling of Stress :   Social Connections:   . Frequency of Communication with Friends and Family:   . Frequency of Social Gatherings with Friends and Family:   . Attends Religious Services:   . Active Member of Clubs or Organizations:   . Attends Archivist Meetings:   Marland Kitchen Marital Status:     Outpatient Encounter Medications as of 12/26/2019  Medication Sig  . Ascorbic Acid (VITAMIN C) 500 MG CAPS Take by mouth 2 (two) times daily.  . cholecalciferol (VITAMIN D3) 25 MCG (  1000 UT) tablet Take 1,000 Units by mouth daily.  . Multiple Vitamin (MULTIVITAMIN) capsule Take 1 capsule by mouth daily.  . silodosin (RAPAFLO) 4 MG CAPS capsule TAKE 1 CAPSULE EVERY DAY WITH BREAKFAST   No facility-administered encounter medications on file as of 12/26/2019.    Review of Systems  Constitutional: Negative for appetite change and unexpected weight change.  HENT: Negative for congestion and sinus pressure.   Respiratory: Negative for cough, chest tightness and shortness of breath.   Cardiovascular: Negative for chest pain, palpitations and leg swelling.  Gastrointestinal: Negative for abdominal pain, diarrhea, nausea and vomiting.  Genitourinary: Negative for difficulty urinating and dysuria.  Musculoskeletal: Negative for joint swelling and myalgias.  Skin:  Negative for color change and rash.  Neurological: Negative for dizziness, light-headedness and headaches.  Psychiatric/Behavioral: Negative for agitation and dysphoric mood.       Objective:    Physical Exam Constitutional:      General: He is not in acute distress.    Appearance: Normal appearance. He is well-developed.  HENT:     Head: Normocephalic and atraumatic.     Right Ear: External ear normal.     Left Ear: External ear normal.  Eyes:     General: No scleral icterus.       Right eye: No discharge.        Left eye: No discharge.     Conjunctiva/sclera: Conjunctivae normal.  Cardiovascular:     Rate and Rhythm: Normal rate and regular rhythm.  Pulmonary:     Effort: Pulmonary effort is normal. No respiratory distress.     Breath sounds: Normal breath sounds.  Abdominal:     General: Bowel sounds are normal.     Palpations: Abdomen is soft.     Tenderness: There is no abdominal tenderness.  Musculoskeletal:        General: No swelling or tenderness.     Cervical back: Neck supple. No tenderness.  Lymphadenopathy:     Cervical: No cervical adenopathy.  Skin:    Findings: No erythema or rash.  Neurological:     Mental Status: He is alert.  Psychiatric:        Mood and Affect: Mood normal.        Behavior: Behavior normal.     BP 126/66   Pulse 74   Temp 98.4 F (36.9 C)   Resp 16   Ht 5\' 7"  (1.702 m)   Wt 149 lb 12.8 oz (67.9 kg)   SpO2 98%   BMI 23.46 kg/m  Wt Readings from Last 3 Encounters:  12/26/19 149 lb 12.8 oz (67.9 kg)  08/26/19 148 lb (67.1 kg)  08/22/19 148 lb (67.1 kg)     Lab Results  Component Value Date   WBC 5.0 08/05/2019   HGB 14.3 08/05/2019   HCT 40.7 08/05/2019   PLT 220 08/05/2019   GLUCOSE 85 12/26/2019   CHOL 159 12/26/2019   TRIG 112.0 12/26/2019   HDL 61.40 12/26/2019   LDLCALC 76 12/26/2019   ALT 9 12/26/2019   AST 23 12/26/2019   NA 138 12/26/2019   K 4.6 12/26/2019   CL 104 12/26/2019   CREATININE 0.97  12/26/2019   BUN 17 12/26/2019   CO2 27 12/26/2019   TSH 1.28 12/26/2019   PSA 6.18 (H) 12/26/2019    DG Thoracic Spine 2 View  Result Date: 08/06/2019 CLINICAL DATA:  Back pain, no known injury. EXAM: THORACIC SPINE 2 VIEWS COMPARISON:  None. FINDINGS: Mild  rightward scoliosis in the midthoracic spine and leftward scoliosis in the upper lumbar spine. Diffuse degenerative changes with disc space narrowing and spurring. Mild compression deformity in a midthoracic vertebral body, age indeterminate. There also appears to be slight compression deformity 2 levels above this level in an upper thoracic vertebral body. No subluxation. IMPRESSION: Mild compression deformities in upper and midthoracic vertebral bodies, age indeterminate. Scoliosis and degenerative changes. Electronically Signed   By: Rolm Baptise M.D.   On: 08/06/2019 03:38   DG Abd 1 View  Result Date: 08/06/2019 CLINICAL DATA:  Abdominal pain EXAM: ABDOMEN - 1 VIEW COMPARISON:  None. FINDINGS: Nonobstructive bowel gas pattern. Moderate stool burden. No organomegaly or free air. No suspicious calcification. Degenerative changes in the lumbar spine. IMPRESSION: Moderate stool burden.  No acute findings. Electronically Signed   By: Rolm Baptise M.D.   On: 08/06/2019 03:41       Assessment & Plan:   Problem List Items Addressed This Visit    Aortic stenosis    I again discussed f/u echo today.  He declines.  States he is feeling well.  Stays active.  Denies chest pain, sob, light headedness, dizziness, etc.  Follow.        Relevant Orders   TSH (Completed)   Lipid panel (Completed)   Comprehensive metabolic panel (Completed)   Back pain, thoracic    Improved. Found to have recent compression fracture.  Saw [facility]U.  Since feeling better, elected to monitor.        BPH with elevated PSA - Primary   Elevated PSA    Has previously declined.  Since feeling better, declines any further w/up or evaluation.  Did agree with psa check to  see if stable.        Relevant Orders   PSA, Medicare (Completed)   Nocturia    Has improved.  Continue rapaflo.        Osteoporosis    Has declined further treatment.  Have ordered bone density.        Vitamin D deficiency    Check vitamin D level with next labs.        Relevant Orders   VITAMIN D 25 Hydroxy (Vit-D Deficiency, Fractures) (Completed)       Einar Pheasant, MD

## 2020-01-01 ENCOUNTER — Telehealth: Payer: Self-pay | Admitting: Internal Medicine

## 2020-01-01 NOTE — Telephone Encounter (Signed)
Left detailed message for patient.

## 2020-01-01 NOTE — Telephone Encounter (Signed)
Pt called returning your call 

## 2020-01-01 NOTE — Telephone Encounter (Signed)
Pt returned your call.  

## 2020-01-04 NOTE — Assessment & Plan Note (Signed)
Has improved.  Continue rapaflo.

## 2020-01-04 NOTE — Assessment & Plan Note (Signed)
Has declined further treatment.  Have ordered bone density.

## 2020-01-04 NOTE — Assessment & Plan Note (Signed)
Check vitamin D level with next labs.  ?

## 2020-01-04 NOTE — Assessment & Plan Note (Signed)
Has previously declined.  Since feeling better, declines any further w/up or evaluation.  Did agree with psa check to see if stable.

## 2020-01-04 NOTE — Assessment & Plan Note (Signed)
Improved. Found to have recent compression fracture.  Saw NSU.  Since feeling better, elected to monitor.

## 2020-01-04 NOTE — Assessment & Plan Note (Signed)
I again discussed f/u echo today.  He declines.  States he is feeling well.  Stays active.  Denies chest pain, sob, light headedness, dizziness, etc.  Follow.

## 2020-02-01 ENCOUNTER — Other Ambulatory Visit: Payer: Self-pay | Admitting: Internal Medicine

## 2020-05-18 ENCOUNTER — Other Ambulatory Visit: Payer: Self-pay | Admitting: Internal Medicine

## 2020-06-15 ENCOUNTER — Emergency Department
Admission: EM | Admit: 2020-06-15 | Discharge: 2020-06-15 | Disposition: A | Discharge status: Home / Self Care | Payer: Medicare HMO | Attending: Emergency Medicine | Admitting: Emergency Medicine

## 2020-06-15 ENCOUNTER — Other Ambulatory Visit: Payer: Self-pay

## 2020-06-15 ENCOUNTER — Emergency Department: Payer: Medicare HMO

## 2020-06-15 DIAGNOSIS — S022XXA Fracture of nasal bones, initial encounter for closed fracture: Secondary | ICD-10-CM | POA: Diagnosis not present

## 2020-06-15 DIAGNOSIS — W19XXXA Unspecified fall, initial encounter: Secondary | ICD-10-CM | POA: Diagnosis not present

## 2020-06-15 DIAGNOSIS — S199XXA Unspecified injury of neck, initial encounter: Secondary | ICD-10-CM | POA: Diagnosis not present

## 2020-06-15 DIAGNOSIS — S0181XA Laceration without foreign body of other part of head, initial encounter: Secondary | ICD-10-CM

## 2020-06-15 DIAGNOSIS — Y9301 Activity, walking, marching and hiking: Secondary | ICD-10-CM | POA: Diagnosis not present

## 2020-06-15 DIAGNOSIS — S0990XA Unspecified injury of head, initial encounter: Secondary | ICD-10-CM | POA: Diagnosis not present

## 2020-06-15 DIAGNOSIS — S0101XA Laceration without foreign body of scalp, initial encounter: Secondary | ICD-10-CM | POA: Diagnosis not present

## 2020-06-15 DIAGNOSIS — S0121XA Laceration without foreign body of nose, initial encounter: Secondary | ICD-10-CM | POA: Diagnosis not present

## 2020-06-15 DIAGNOSIS — Y990 Civilian activity done for income or pay: Secondary | ICD-10-CM | POA: Insufficient documentation

## 2020-06-15 DIAGNOSIS — Y9289 Other specified places as the place of occurrence of the external cause: Secondary | ICD-10-CM | POA: Insufficient documentation

## 2020-06-15 DIAGNOSIS — E161 Other hypoglycemia: Secondary | ICD-10-CM | POA: Diagnosis not present

## 2020-06-15 DIAGNOSIS — W010XXA Fall on same level from slipping, tripping and stumbling without subsequent striking against object, initial encounter: Secondary | ICD-10-CM | POA: Insufficient documentation

## 2020-06-15 DIAGNOSIS — R000 Tachycardia, unspecified: Secondary | ICD-10-CM | POA: Diagnosis not present

## 2020-06-15 DIAGNOSIS — E162 Hypoglycemia, unspecified: Secondary | ICD-10-CM | POA: Diagnosis not present

## 2020-06-15 DIAGNOSIS — R42 Dizziness and giddiness: Secondary | ICD-10-CM | POA: Diagnosis not present

## 2020-06-15 MED ORDER — BACITRACIN ZINC 500 UNIT/GM EX OINT: TOPICAL_OINTMENT | Freq: Once | CUTANEOUS | Status: AC

## 2020-06-15 MED ORDER — BACITRACIN-NEOMYCIN-POLYMYXIN 400-5-5000 EX OINT
TOPICAL_OINTMENT | CUTANEOUS | Status: AC
  Filled 2020-06-15: qty 1

## 2020-06-15 MED ORDER — LIDOCAINE-EPINEPHRINE 2 %-1:100000 IJ SOLN
20.0000 mL | Freq: Once | INTRAMUSCULAR | Status: AC
  Administered 2020-06-15: 20 mL
  Filled 2020-06-15: qty 1

## 2020-06-15 NOTE — ED Notes (Signed)
Pt ambulatory to restroom, steady gait.

## 2020-06-15 NOTE — ED Provider Notes (Signed)
[facility] Emergency Department Provider Note   ____________________________________________   First MD Initiated Contact with Patient 06/15/20 1224     (approximate)  I have reviewed the triage vital signs and the nursing notes.   HISTORY  Chief Complaint Fall    HPI Marvin Farley is a 86 y.o. male with possible history of aortic stenosis, BPH, and nephrolithiasis who presents to the ED following fall.  Patient states that he was at work when he tripped on a rug by the door, falling forward and hitting his head.  He denies any lightheadedness or syncope preceding the fall.  He did not lose consciousness after hitting his head, currently complains of a mild headache as well as cuts to his forehead and nose.  He denies any neck pain, facial pain, numbness, or weakness.  He does not take any blood thinners.        Past Medical History:  Diagnosis Date  . BPH (benign prostatic hypertrophy)   . Kidney stones   . Left wrist fracture 1984  . Osteoporosis   . Right wrist fracture 1996    Patient Active Problem List   Diagnosis Date Noted  . Vitamin D deficiency 12/26/2019  . Rash of penis 08/07/2019  . Back pain, thoracic 08/05/2019  . Dysuria 08/05/2019  . Nocturia 08/05/2019  . Cough 05/27/2019  . Stress 10/20/2017  . Elevated PSA 10/11/2015  . Health care maintenance 04/08/2015  . Skin lesion 10/08/2014  . Rotator cuff tear 10/08/2014  . BPH with elevated PSA 12/16/2012  . Aortic stenosis 12/04/2012  . Osteoporosis 12/04/2012  . Kidney stones 12/04/2012    Past Surgical History:  Procedure Laterality Date  . HERNIA REPAIR Bilateral    inguinal  . TONSILLECTOMY      Prior to Admission medications   Medication Sig Start Date End Date Taking? Authorizing Provider  Ascorbic Acid (VITAMIN C) 500 MG CAPS Take by mouth 2 (two) times daily.    [provider]  cholecalciferol (VITAMIN D3) 25 MCG (1000 UT) tablet Take 1,000 Units  by mouth daily.    [provider]  Multiple Vitamin (MULTIVITAMIN) capsule Take 1 capsule by mouth daily.    [provider]  silodosin (RAPAFLO) 4 MG CAPS capsule TAKE 1 CAPSULE BY MOUTH DAILY WITH BREAKFAST 05/18/20   Einar Pheasant, MD    Allergies Codeine sulfate  Family History  Problem Relation Age of Onset  . Cancer Mother        leukemia  . Heart disease Father        heart attack  . Cancer Brother        Lymphoma    Social History Social History   Tobacco Use  . Smoking status: Never Smoker  . Smokeless tobacco: Never Used  Vaping Use  . Vaping Use: Never used  Substance Use Topics  . Alcohol use: No    Alcohol/week: 0.0 standard drinks  . Drug use: No    Review of Systems  Constitutional: No fever/chills Eyes: No visual changes. ENT: No sore throat. Cardiovascular: Denies chest pain. Respiratory: Denies shortness of breath. Gastrointestinal: No abdominal pain.  No nausea, no vomiting.  No diarrhea.  No constipation. Genitourinary: Negative for dysuria. Musculoskeletal: Negative for back pain. Skin: Negative for rash.  Positive for lacerations. Neurological: Positive for headaches, negative for focal weakness or numbness.  ____________________________________________   PHYSICAL EXAM:  VITAL SIGNS: ED Triage Vitals  Enc Vitals Group     BP  06/15/20 1219 (!) 161/84     Pulse Rate 06/15/20 1219 (!) 101     Resp 06/15/20 1219 18     Temp 06/15/20 1219 97.9 F (36.6 C)     Temp Source 06/15/20 1219 Oral     SpO2 06/15/20 1219 96 %     Weight 06/15/20 1220 154 lb (69.9 kg)     Height 06/15/20 1220 5' 5.5" (1.664 m)     Head Circumference --      Peak Flow --      Pain Score 06/15/20 1219 2     Pain Loc --      Pain Edu? --      Excl. in [place]? --     Constitutional: Alert and oriented. Eyes: Conjunctivae are normal. Head: Approximately 3 cm laceration over frontal scalp with minimal active bleeding.  Approximately 1 cm  laceration over the tip of the nose with no active bleeding, no septal hematoma noted.  No facial bony tenderness noted. Nose: No congestion/rhinnorhea. Mouth/Throat: Mucous membranes are moist. Neck: Normal ROM, no midline cervical spine tenderness. Cardiovascular: Normal rate, regular rhythm. Grossly normal heart sounds. Respiratory: Normal respiratory effort.  No retractions. Lungs CTAB. Gastrointestinal: Soft and nontender. No distention. Genitourinary: deferred Musculoskeletal: No lower extremity tenderness nor edema. Neurologic:  Normal speech and language. No gross focal neurologic deficits are appreciated. Skin:  Skin is warm, dry and intact. No rash noted. Psychiatric: Mood and affect are normal. Speech and behavior are normal.  ____________________________________________   LABS (all labs ordered are listed, but only abnormal results are displayed)  Labs Reviewed - No data to display   PROCEDURES  Procedure(s) performed (including Critical Care):  Marland KitchenMarland KitchenLaceration Repair  Date/Time: 06/15/2020 3:29 PM Performed by: Blake Divine, MD Authorized by: Blake Divine, MD   Consent:    Consent obtained:  Verbal   Consent given by:  Patient Anesthesia (see MAR for exact dosages):    Anesthesia method:  Local infiltration   Local anesthetic:  Lidocaine 1% WITH epi Laceration details:    Location:  Scalp   Scalp location:  Frontal   Length (cm):  4 Repair type:    Repair type:  Simple Pre-procedure details:    Preparation:  Patient was prepped and draped in usual sterile fashion and imaging obtained to evaluate for foreign bodies Exploration:    Contaminated: no   Treatment:    Area cleansed with:  Saline   Amount of cleaning:  Standard   Irrigation solution:  Sterile saline   Irrigation method:  Pressure wash   Visualized foreign bodies/material removed: no   Skin repair:    Repair method:  Sutures   Suture size:  4-0   Suture material:  Nylon   Suture  technique:  Simple interrupted   Number of sutures:  4 Approximation:    Approximation:  Close Post-procedure details:    Dressing:  Antibiotic ointment and sterile dressing   Patient tolerance of procedure:  Tolerated well, no immediate complications .Marland KitchenLaceration Repair  Date/Time: 06/15/2020 3:31 PM Performed by: Blake Divine, MD Authorized by: Blake Divine, MD   Consent:    Consent obtained:  Verbal   Consent given by:  Patient Anesthesia (see MAR for exact dosages):    Anesthesia method:  Local infiltration   Local anesthetic:  Lidocaine 1% WITH epi Laceration details:    Location:  Face   Face location:  Nose   Length (cm):  1 Repair type:    Repair type:  Simple  Pre-procedure details:    Preparation:  Patient was prepped and draped in usual sterile fashion Exploration:    Contaminated: no   Treatment:    Area cleansed with:  Saline   Amount of cleaning:  Standard   Irrigation solution:  Sterile saline   Irrigation method:  Pressure wash   Visualized foreign bodies/material removed: no   Skin repair:    Repair method:  Sutures   Suture size:  4-0   Suture material:  Nylon   Number of sutures:  1 Approximation:    Approximation:  Close Post-procedure details:    Dressing:  Antibiotic ointment   Patient tolerance of procedure:  Tolerated well, no immediate complications    ED ECG REPORT I, Blake Divine, the attending physician, personally viewed and interpreted this ECG.   Date: 06/15/2020  EKG Time: 12:17  Rate: 101  Rhythm: sinus tachycardia  Axis: Normal  Intervals:none  ST&T Change: None  ____________________________________________   INITIAL IMPRESSION / ASSESSMENT AND PLAN / ED COURSE       86 year old male with past medical history of aortic stenosis, nephrolithiasis, and BPH who presents to the ED following witnessed mechanical fall where he struck his head.  He has no focal neurologic deficits on exam and no obvious facial  deformities, we will further assess with CT head, C-spine, and maxillofacial.  He does have lacerations to his frontal scalp as well as the tip of his nose, which we will repair.  He declines any pain medication at this time.  CT head and C-spine are negative for acute process, maxillofacial scan shows bilateral nasal bone fractures but no other acute injury.  Lacerations to frontal scalp and tip of nose were repaired without difficulty, bacitracin applied to lacerations.  Patient is appropriate for discharge home with outpatient follow-up with ENT, was counseled to return to the ED for new or worsening symptoms.  Patient agrees with plan.      ____________________________________________   FINAL CLINICAL IMPRESSION(S) / ED DIAGNOSES  Final diagnoses:  Fall, initial encounter  Closed fracture of nasal bone, initial encounter  Facial laceration, initial encounter     ED Discharge Orders    None       Note:  This document was prepared using Dragon voice recognition software and may include unintentional dictation errors.   Blake Divine, MD 06/15/20 [phone]

## 2020-06-15 NOTE — ED Triage Notes (Addendum)
Pt here from work [employer]), after a witnessed fall over a rug. Pt hit head and side of face/ nose. Denies LOC. Pt denies being on blood thinners. Head lac present on R forehead and down middle of nose, dressing is present and bleeding is controlled at this time. Pt also has blood in both nostrils. Pt is A&Ox4. Only pain reported is a mild headache.  EMS VSS.

## 2020-06-23 ENCOUNTER — Ambulatory Visit (INDEPENDENT_AMBULATORY_CARE_PROVIDER_SITE_OTHER): Payer: Medicare HMO | Admitting: Nurse Practitioner

## 2020-06-23 ENCOUNTER — Other Ambulatory Visit: Payer: Self-pay

## 2020-06-23 ENCOUNTER — Encounter: Payer: Self-pay | Admitting: Nurse Practitioner

## 2020-06-23 VITALS — BP 106/60 | HR 71 | Temp 98.0°F | Ht 65.5 in | Wt 148.0 lb

## 2020-06-23 DIAGNOSIS — Z4802 Encounter for removal of sutures: Secondary | ICD-10-CM | POA: Diagnosis not present

## 2020-06-23 NOTE — Progress Notes (Signed)
Established Patient Office Visit  Subjective:  Patient ID: Marvin Farley, male    DOB: 06-May-1932  Age: 86 y.o. MRN: [id number]  CC:  Chief Complaint  Patient presents with   Acute Visit    stitch removal    HPI Marvin Farley is an 86 year old who presents for suture removal . On 06/15/20, he tripped on a welcome mat and fell onto his face on a hardwood floor. No LOC. He had 4 sutures on his forehead and 1 suture at the external nose tip. He has had no trouble with the sites and signs of infection. He sees the ENT- Dr. Kathyrn Sheriff  on Friday.   Past Medical History:  Diagnosis Date   BPH (benign prostatic hypertrophy)    Kidney stones    Left wrist fracture 1984   Osteoporosis    Right wrist fracture 1996    Past Surgical History:  Procedure Laterality Date   HERNIA REPAIR Bilateral    inguinal   TONSILLECTOMY      Family History  Problem Relation Age of Onset   Cancer Mother        leukemia   Heart disease Father        heart attack   Cancer Brother        Lymphoma    Social History   Socioeconomic History   Marital status: Married    Spouse name: Not on file   Number of children: Not on file   Years of education: Not on file   Highest education level: Not on file  Occupational History   Not on file  Tobacco Use   Smoking status: Never Smoker   Smokeless tobacco: Never Used  Vaping Use   Vaping Use: Never used  Substance and Sexual Activity   Alcohol use: No    Alcohol/week: 0.0 standard drinks   Drug use: No   Sexual activity: Not on file  Other Topics Concern   Not on file  Social History Narrative   Not on file   Social Determinants of Health   Financial Resource Strain:    Difficulty of Paying Living Expenses: Not on file  Food Insecurity:    Worried About [place] in the Last Year: Not on file   Ran Out of Food in the Last Year: Not on file  Transportation Needs: No Transportation Needs   Lack  of Transportation (Medical): No   Lack of Transportation (Non-Medical): No  Physical Activity: Insufficiently Active   Days of Exercise per Week: 4 days   Minutes of Exercise per Session: 30 min  Stress:    Feeling of Stress : Not on file  Social Connections:    Frequency of Communication with Friends and Family: Not on file   Frequency of Social Gatherings with Friends and Family: Not on file   Attends Religious Services: Not on file   Active Member of Clubs or Organizations: Not on file   Attends Archivist Meetings: Not on file   Marital Status: Not on file  Intimate Partner Violence: Not At Risk   Fear of Current or Ex-Partner: No   Emotionally Abused: No   Physically Abused: No   Sexually Abused: No    Outpatient Medications Prior to Visit  Medication Sig Dispense Refill   Ascorbic Acid (VITAMIN C) 500 MG CAPS Take by mouth 2 (two) times daily.     cholecalciferol (VITAMIN D3) 25 MCG (1000 UT) tablet Take 1,000 Units by mouth daily.  Multiple Vitamin (MULTIVITAMIN) capsule Take 1 capsule by mouth daily.     silodosin (RAPAFLO) 4 MG CAPS capsule TAKE 1 CAPSULE BY MOUTH DAILY WITH BREAKFAST 30 capsule 1   No facility-administered medications prior to visit.    Allergies  Allergen Reactions   Codeine Sulfate     Review of Systems No fever or chills.  No signs of infection.  No headaches.   Objective:    Physical Exam Constitutional:      Appearance: Normal appearance. He is normal weight.  Skin:    General: Skin is warm and dry.     Comments: Forehead laceration with sutures intact, no signs of infection, healed site without infection.  A well-healed scar at the tip of the nose, suture is visible x1.    Removed 4 sutures without problem from the forehead.  Remove one suture from the nose without problem.  Some bruising under eyes.  No sign of infecton.      BP 106/60 (BP Location: Left Arm, Patient Position: Sitting, Cuff Size:  Normal)    Pulse 71    Temp 98 F (36.7 C) (Oral)    Ht 5' 5.5" (1.664 m)    Wt 148 lb (67.1 kg)    SpO2 98%    BMI 24.25 kg/m  Wt Readings from Last 3 Encounters:  06/23/20 148 lb (67.1 kg)  06/15/20 154 lb (69.9 kg)  12/26/19 149 lb 12.8 oz (67.9 kg)   Pulse Readings from Last 3 Encounters:  06/23/20 71  06/15/20 83  12/26/19 74    BP Readings from Last 3 Encounters:  06/23/20 106/60  06/15/20 (!) 146/101  12/26/19 126/66    Lab Results  Component Value Date   CHOL 159 12/26/2019   HDL 61.40 12/26/2019   LDLCALC 76 12/26/2019   TRIG 112.0 12/26/2019   CHOLHDL 3 12/26/2019      Health Maintenance Due  Topic Date Due   COVID-19 Vaccine (1) Never done   INFLUENZA VACCINE  Never done    There are no preventive care reminders to display for this patient.  Lab Results  Component Value Date   TSH 1.28 12/26/2019   Lab Results  Component Value Date   WBC 5.0 08/05/2019   HGB 14.3 08/05/2019   HCT 40.7 08/05/2019   MCV 84.3 08/05/2019   PLT 220 08/05/2019   Lab Results  Component Value Date   NA 138 12/26/2019   K 4.6 12/26/2019   CO2 27 12/26/2019   GLUCOSE 85 12/26/2019   BUN 17 12/26/2019   CREATININE 0.97 12/26/2019   BILITOT 0.5 12/26/2019   ALKPHOS 63 12/26/2019   AST 23 12/26/2019   ALT 9 12/26/2019   PROT 6.3 12/26/2019   ALBUMIN 4.0 12/26/2019   CALCIUM 8.9 12/26/2019   ANIONGAP 9 08/05/2019   GFR 73.34 12/26/2019   Lab Results  Component Value Date   CHOL 159 12/26/2019   Lab Results  Component Value Date   HDL 61.40 12/26/2019   Lab Results  Component Value Date   LDLCALC 76 12/26/2019   Lab Results  Component Value Date   TRIG 112.0 12/26/2019   Lab Results  Component Value Date   CHOLHDL 3 12/26/2019   No results found for: HGBA1C    Assessment & Plan:   Problem List Items Addressed This Visit      Other   Visit for suture removal - Primary      No orders of the defined types were placed  in this  encounter.  All of the stitches were removed and the sites look well healed.  Follow up if having any problems. Continue to clean gently.  Follow-up: Return if symptoms worsen or fail to improve.    Denice Paradise, NP   This visit occurred during the SARS-CoV-2 public health emergency.  Safety protocols were in place, including screening questions prior to the visit, additional usage of staff PPE, and extensive cleaning of exam room while observing appropriate contact time as indicated for disinfecting solutions.

## 2020-06-23 NOTE — Patient Instructions (Addendum)
All of the stitches were removed and the sites look well healed.  Follow up if having any problems. Continue to clean gently.

## 2020-07-02 ENCOUNTER — Ambulatory Visit (INDEPENDENT_AMBULATORY_CARE_PROVIDER_SITE_OTHER): Payer: Medicare HMO | Admitting: Internal Medicine

## 2020-07-02 ENCOUNTER — Encounter: Payer: Self-pay | Admitting: Internal Medicine

## 2020-07-02 ENCOUNTER — Other Ambulatory Visit: Payer: Self-pay

## 2020-07-02 DIAGNOSIS — H539 Unspecified visual disturbance: Secondary | ICD-10-CM

## 2020-07-02 DIAGNOSIS — R69 Illness, unspecified: Secondary | ICD-10-CM | POA: Diagnosis not present

## 2020-07-02 DIAGNOSIS — I350 Nonrheumatic aortic (valve) stenosis: Secondary | ICD-10-CM | POA: Diagnosis not present

## 2020-07-02 DIAGNOSIS — M546 Pain in thoracic spine: Secondary | ICD-10-CM

## 2020-07-02 DIAGNOSIS — Z0000 Encounter for general adult medical examination without abnormal findings: Secondary | ICD-10-CM

## 2020-07-02 DIAGNOSIS — N400 Enlarged prostate without lower urinary tract symptoms: Secondary | ICD-10-CM | POA: Diagnosis not present

## 2020-07-02 DIAGNOSIS — E559 Vitamin D deficiency, unspecified: Secondary | ICD-10-CM | POA: Diagnosis not present

## 2020-07-02 DIAGNOSIS — F439 Reaction to severe stress, unspecified: Secondary | ICD-10-CM

## 2020-07-02 DIAGNOSIS — R234 Changes in skin texture: Secondary | ICD-10-CM | POA: Diagnosis not present

## 2020-07-02 DIAGNOSIS — R972 Elevated prostate specific antigen [PSA]: Secondary | ICD-10-CM | POA: Diagnosis not present

## 2020-07-02 NOTE — Progress Notes (Signed)
Patient ID: Marvin Farley, male   DOB: [birth year], 86 y.o.   MRN: [id number]   Subjective:    Patient ID: Marvin Farley, male    DOB: Oct 08, 1931, 86 y.o.   MRN: [id number]  HPI This visit occurred during the SARS-CoV-2 public health emergency.  Safety protocols were in place, including screening questions prior to the visit, additional usage of staff PPE, and extensive cleaning of exam room while observing appropriate contact time as indicated for disinfecting solutions.  Patient here for his physical exam.  He is still working.  Was seen in ER 06/15/20 - after falling at work - tripped on a rug.  No LOC.  Sutures placed and subsequently removed.  Laceration healed. No residual headache. No dizziness or light headedness.  CT revealed misplaced nasal bone.  No further w/up warranted.  Saw chiropractor Wednesday - for thoracic back pain.  Taking warm shower.  Feels is stable.  No chest pain or sob reported.  Discussed history of AS and need for f/u.  Has previously declined.  Has noticed occasional colored dots - vision.  Occurs after eating out.  May last 15 minutes to the most 2 hours.  No vision loss.  No eye pain.  No abdominal pain.  Bowels stable.     Past Medical History:  Diagnosis Date  . BPH (benign prostatic hypertrophy)   . Kidney stones   . Left wrist fracture 1984  . Osteoporosis   . Right wrist fracture 1996   Past Surgical History:  Procedure Laterality Date  . HERNIA REPAIR Bilateral    inguinal  . TONSILLECTOMY     Family History  Problem Relation Age of Onset  . Cancer Mother        leukemia  . Heart disease Father        heart attack  . Cancer Brother        Lymphoma   Social History   Socioeconomic History  . Marital status: Married    Spouse name: Not on file  . Number of children: Not on file  . Years of education: Not on file  . Highest education level: Not on file  Occupational History  . Not on file  Tobacco Use  . Smoking status: Never Smoker    . Smokeless tobacco: Never Used  Vaping Use  . Vaping Use: Never used  Substance and Sexual Activity  . Alcohol use: No    Alcohol/week: 0.0 standard drinks  . Drug use: No  . Sexual activity: Not on file  Other Topics Concern  . Not on file  Social History Narrative  . Not on file   Social Determinants of Health   Financial Resource Strain:   . Difficulty of Paying Living Expenses: Not on file  Food Insecurity:   . Worried About Charity fundraiser in the Last Year: Not on file  . Ran Out of Food in the Last Year: Not on file  Transportation Needs: No Transportation Needs  . Lack of Transportation (Medical): No  . Lack of Transportation (Non-Medical): No  Physical Activity: Insufficiently Active  . Days of Exercise per Week: 4 days  . Minutes of Exercise per Session: 30 min  Stress:   . Feeling of Stress : Not on file  Social Connections:   . Frequency of Communication with Friends and Family: Not on file  . Frequency of Social Gatherings with Friends and Family: Not on file  . Attends Religious Services: Not on file  .  Active Member of Clubs or Organizations: Not on file  . Attends Archivist Meetings: Not on file  . Marital Status: Not on file    Outpatient Encounter Medications as of 07/02/2020  Medication Sig  . Ascorbic Acid (VITAMIN C) 500 MG CAPS Take by mouth 2 (two) times daily.  . cholecalciferol (VITAMIN D3) 25 MCG (1000 UT) tablet Take 1,000 Units by mouth daily.  . Multiple Vitamin (MULTIVITAMIN) capsule Take 1 capsule by mouth daily.  . silodosin (RAPAFLO) 4 MG CAPS capsule TAKE 1 CAPSULE BY MOUTH DAILY WITH BREAKFAST   No facility-administered encounter medications on file as of 07/02/2020.    Review of Systems  Constitutional: Negative for appetite change and unexpected weight change.  HENT: Negative for congestion, sinus pressure and sore throat.   Eyes: Negative for pain.       Colored dots (intermittent) as outlined.   Respiratory:  Negative for cough, chest tightness and shortness of breath.   Cardiovascular: Negative for chest pain, palpitations and leg swelling.  Gastrointestinal: Negative for abdominal pain, diarrhea, nausea and vomiting.  Genitourinary: Negative for difficulty urinating and dysuria.  Musculoskeletal: Negative for joint swelling and myalgias.  Skin: Negative for color change and rash.  Neurological: Negative for dizziness, light-headedness and headaches.  Hematological: Negative for adenopathy. Does not bruise/bleed easily.  Psychiatric/Behavioral: Negative for agitation and dysphoric mood.       Objective:    Physical Exam Vitals reviewed.  Constitutional:      General: He is not in acute distress.    Appearance: Normal appearance. He is well-developed.  HENT:     Head: Normocephalic and atraumatic.     Right Ear: External ear normal.     Left Ear: External ear normal.  Eyes:     General: No scleral icterus.       Right eye: No discharge.        Left eye: No discharge.     Conjunctiva/sclera: Conjunctivae normal.  Neck:     Thyroid: No thyromegaly.  Cardiovascular:     Rate and Rhythm: Normal rate and regular rhythm.     Comments: 2/6 harsh systolic murmur Pulmonary:     Effort: No respiratory distress.     Breath sounds: Normal breath sounds. No wheezing.  Abdominal:     General: Bowel sounds are normal.     Palpations: Abdomen is soft.     Tenderness: There is no abdominal tenderness.  Genitourinary:    Comments: Not performed.  Musculoskeletal:        General: No swelling or tenderness.     Cervical back: Neck supple. No tenderness.  Lymphadenopathy:     Cervical: No cervical adenopathy.  Skin:    Findings: No erythema or rash.  Neurological:     Mental Status: He is alert and oriented to person, place, and time.  Psychiatric:        Mood and Affect: Mood normal.        Behavior: Behavior normal.     BP 118/70   Pulse 78   Temp 98.4 F (36.9 C) (Oral)   Resp  16   Ht 5\' 6"  (1.676 m)   Wt 145 lb 12.8 oz (66.1 kg)   SpO2 98%   BMI 23.53 kg/m  Wt Readings from Last 3 Encounters:  07/02/20 145 lb 12.8 oz (66.1 kg)  06/23/20 148 lb (67.1 kg)  06/15/20 154 lb (69.9 kg)     Lab Results  Component Value Date  WBC 5.0 08/05/2019   HGB 14.3 08/05/2019   HCT 40.7 08/05/2019   PLT 220 08/05/2019   GLUCOSE 85 12/26/2019   CHOL 159 12/26/2019   TRIG 112.0 12/26/2019   HDL 61.40 12/26/2019   LDLCALC 76 12/26/2019   ALT 9 12/26/2019   AST 23 12/26/2019   NA 138 12/26/2019   K 4.6 12/26/2019   CL 104 12/26/2019   CREATININE 0.97 12/26/2019   BUN 17 12/26/2019   CO2 27 12/26/2019   TSH 1.28 12/26/2019   PSA 6.18 (H) 12/26/2019    CT Head Wo Contrast  Result Date: 06/15/2020 CLINICAL DATA:  Head trauma, minor. Facial trauma. Neck trauma. Additional history provided: Witnessed fall hitting head and side of face/nose. EXAM: CT HEAD WITHOUT CONTRAST CT MAXILLOFACIAL WITHOUT CONTRAST CT CERVICAL SPINE WITHOUT CONTRAST TECHNIQUE: Multidetector CT imaging of the head, cervical spine, and maxillofacial structures were performed using the standard protocol without intravenous contrast. Multiplanar CT image reconstructions of the cervical spine and maxillofacial structures were also generated. COMPARISON:  Head CT 12/21/2012. Report from brain MRI 07/28/1999 (images unavailable). FINDINGS: CT HEAD FINDINGS Brain: Stable, mild generalized parenchymal atrophy. Stable, mild ill-defined hypoattenuation within the cerebral white matter which is nonspecific, but consistent with chronic small vessel ischemic disease. There is no acute intracranial hemorrhage. No demarcated cortical infarct. No extra-axial fluid collection. No evidence of intracranial mass. No midline shift. Vascular: No hyperdense vessel.  Atherosclerotic calcifications. Skull: Normal. Negative for fracture or focal lesion. Other: No significant mastoid effusion. CT MAXILLOFACIAL FINDINGS Osseous:  There is an acute comminuted and displaced fracture of the left nasal bone. This fracture extends into the superior aspect of the anterior bony nasal septum (for instance as seen on series 9, images 14-17). A mildly displaced acute fracture of the right nasal bone is also present. There is overlying soft tissue swelling which extends to the right periorbital region. Orbits: Right periorbital soft tissue swelling. Otherwise, there is no acute finding. The globes are normal in size and contour. The extraocular muscles and optic nerve sheath complexes are symmetric and unremarkable. Sinuses: Mild ethmoid and left maxillary sinus mucosal thickening. Soft tissues: Soft tissue swelling overlying the nose and extending to the right periorbital region. CT CERVICAL SPINE FINDINGS Alignment: Cervical dextrocurvature. Straightening of the expected cervical lordosis. Trace C4-C5 grade 1 anterolisthesis. Trace C5-C6 grade 1 retrolisthesis. Skull base and vertebrae: The basion-dental and atlanto-dental intervals are maintained.No evidence of acute fracture to the cervical spine. Mild age-indeterminate T1 vertebral body anterior wedge compression deformity. Soft tissues and spinal canal: No prevertebral fluid or swelling. No visible canal hematoma. Disc levels: Cervical spondylosis with multilevel disc space narrowing, posterior disc osteophytes, uncovertebral hypertrophy and facet arthrosis. At C3-C4, there is fusion across the left-sided facet joints and possibly along the posterior aspect of the disc space. Upper chest: No consolidation within the imaged lung apices. No visible pneumothorax. IMPRESSION: CT head: 1. No evidence of acute intracranial abnormality. 2. Mild generalized cerebral atrophy and chronic small vessel ischemic disease, stable as compared to the prior head CT of 02/22/2016. CT maxillofacial: 1. Acute comminuted and displaced fracture of the left nasal bone extending to involve the superior aspect of the  anterior bony nasal septum. 2. Acute, mildly displaced fracture of the right nasal bone. 3. Soft tissue swelling overlying the nasal bones and extending to the right periorbital region. 4. Mild ethmoid and left maxillary sinus mucosal thickening. CT cervical spine: 1. No evidence of acute fracture to the cervical spine. 2.  Mild age-indeterminate T1 vertebral body anterior wedge compression deformity. 3. Cervical dextrocurvature. 4. Mild C4-C5 grade 1 anterolisthesis and C5-C6 grade 1 retrolisthesis. 5. Cervical spondylosis as described. Electronically Signed   By: Kellie Simmering DO   On: 06/15/2020 13:39   CT Cervical Spine Wo Contrast  Result Date: 06/15/2020 CLINICAL DATA:  Head trauma, minor. Facial trauma. Neck trauma. Additional history provided: Witnessed fall hitting head and side of face/nose. EXAM: CT HEAD WITHOUT CONTRAST CT MAXILLOFACIAL WITHOUT CONTRAST CT CERVICAL SPINE WITHOUT CONTRAST TECHNIQUE: Multidetector CT imaging of the head, cervical spine, and maxillofacial structures were performed using the standard protocol without intravenous contrast. Multiplanar CT image reconstructions of the cervical spine and maxillofacial structures were also generated. COMPARISON:  Head CT 12/21/2012. Report from brain MRI 07/28/1999 (images unavailable). FINDINGS: CT HEAD FINDINGS Brain: Stable, mild generalized parenchymal atrophy. Stable, mild ill-defined hypoattenuation within the cerebral white matter which is nonspecific, but consistent with chronic small vessel ischemic disease. There is no acute intracranial hemorrhage. No demarcated cortical infarct. No extra-axial fluid collection. No evidence of intracranial mass. No midline shift. Vascular: No hyperdense vessel.  Atherosclerotic calcifications. Skull: Normal. Negative for fracture or focal lesion. Other: No significant mastoid effusion. CT MAXILLOFACIAL FINDINGS Osseous: There is an acute comminuted and displaced fracture of the left nasal bone. This  fracture extends into the superior aspect of the anterior bony nasal septum (for instance as seen on series 9, images 14-17). A mildly displaced acute fracture of the right nasal bone is also present. There is overlying soft tissue swelling which extends to the right periorbital region. Orbits: Right periorbital soft tissue swelling. Otherwise, there is no acute finding. The globes are normal in size and contour. The extraocular muscles and optic nerve sheath complexes are symmetric and unremarkable. Sinuses: Mild ethmoid and left maxillary sinus mucosal thickening. Soft tissues: Soft tissue swelling overlying the nose and extending to the right periorbital region. CT CERVICAL SPINE FINDINGS Alignment: Cervical dextrocurvature. Straightening of the expected cervical lordosis. Trace C4-C5 grade 1 anterolisthesis. Trace C5-C6 grade 1 retrolisthesis. Skull base and vertebrae: The basion-dental and atlanto-dental intervals are maintained.No evidence of acute fracture to the cervical spine. Mild age-indeterminate T1 vertebral body anterior wedge compression deformity. Soft tissues and spinal canal: No prevertebral fluid or swelling. No visible canal hematoma. Disc levels: Cervical spondylosis with multilevel disc space narrowing, posterior disc osteophytes, uncovertebral hypertrophy and facet arthrosis. At C3-C4, there is fusion across the left-sided facet joints and possibly along the posterior aspect of the disc space. Upper chest: No consolidation within the imaged lung apices. No visible pneumothorax. IMPRESSION: CT head: 1. No evidence of acute intracranial abnormality. 2. Mild generalized cerebral atrophy and chronic small vessel ischemic disease, stable as compared to the prior head CT of 02/22/2016. CT maxillofacial: 1. Acute comminuted and displaced fracture of the left nasal bone extending to involve the superior aspect of the anterior bony nasal septum. 2. Acute, mildly displaced fracture of the right nasal  bone. 3. Soft tissue swelling overlying the nasal bones and extending to the right periorbital region. 4. Mild ethmoid and left maxillary sinus mucosal thickening. CT cervical spine: 1. No evidence of acute fracture to the cervical spine. 2. Mild age-indeterminate T1 vertebral body anterior wedge compression deformity. 3. Cervical dextrocurvature. 4. Mild C4-C5 grade 1 anterolisthesis and C5-C6 grade 1 retrolisthesis. 5. Cervical spondylosis as described. Electronically Signed   By: Kellie Simmering DO   On: 06/15/2020 13:39   CT Maxillofacial WO CM  Result Date: 06/15/2020  CLINICAL DATA:  Head trauma, minor. Facial trauma. Neck trauma. Additional history provided: Witnessed fall hitting head and side of face/nose. EXAM: CT HEAD WITHOUT CONTRAST CT MAXILLOFACIAL WITHOUT CONTRAST CT CERVICAL SPINE WITHOUT CONTRAST TECHNIQUE: Multidetector CT imaging of the head, cervical spine, and maxillofacial structures were performed using the standard protocol without intravenous contrast. Multiplanar CT image reconstructions of the cervical spine and maxillofacial structures were also generated. COMPARISON:  Head CT 12/21/2012. Report from brain MRI 07/28/1999 (images unavailable). FINDINGS: CT HEAD FINDINGS Brain: Stable, mild generalized parenchymal atrophy. Stable, mild ill-defined hypoattenuation within the cerebral white matter which is nonspecific, but consistent with chronic small vessel ischemic disease. There is no acute intracranial hemorrhage. No demarcated cortical infarct. No extra-axial fluid collection. No evidence of intracranial mass. No midline shift. Vascular: No hyperdense vessel.  Atherosclerotic calcifications. Skull: Normal. Negative for fracture or focal lesion. Other: No significant mastoid effusion. CT MAXILLOFACIAL FINDINGS Osseous: There is an acute comminuted and displaced fracture of the left nasal bone. This fracture extends into the superior aspect of the anterior bony nasal septum (for instance  as seen on series 9, images 14-17). A mildly displaced acute fracture of the right nasal bone is also present. There is overlying soft tissue swelling which extends to the right periorbital region. Orbits: Right periorbital soft tissue swelling. Otherwise, there is no acute finding. The globes are normal in size and contour. The extraocular muscles and optic nerve sheath complexes are symmetric and unremarkable. Sinuses: Mild ethmoid and left maxillary sinus mucosal thickening. Soft tissues: Soft tissue swelling overlying the nose and extending to the right periorbital region. CT CERVICAL SPINE FINDINGS Alignment: Cervical dextrocurvature. Straightening of the expected cervical lordosis. Trace C4-C5 grade 1 anterolisthesis. Trace C5-C6 grade 1 retrolisthesis. Skull base and vertebrae: The basion-dental and atlanto-dental intervals are maintained.No evidence of acute fracture to the cervical spine. Mild age-indeterminate T1 vertebral body anterior wedge compression deformity. Soft tissues and spinal canal: No prevertebral fluid or swelling. No visible canal hematoma. Disc levels: Cervical spondylosis with multilevel disc space narrowing, posterior disc osteophytes, uncovertebral hypertrophy and facet arthrosis. At C3-C4, there is fusion across the left-sided facet joints and possibly along the posterior aspect of the disc space. Upper chest: No consolidation within the imaged lung apices. No visible pneumothorax. IMPRESSION: CT head: 1. No evidence of acute intracranial abnormality. 2. Mild generalized cerebral atrophy and chronic small vessel ischemic disease, stable as compared to the prior head CT of 02/22/2016. CT maxillofacial: 1. Acute comminuted and displaced fracture of the left nasal bone extending to involve the superior aspect of the anterior bony nasal septum. 2. Acute, mildly displaced fracture of the right nasal bone. 3. Soft tissue swelling overlying the nasal bones and extending to the right  periorbital region. 4. Mild ethmoid and left maxillary sinus mucosal thickening. CT cervical spine: 1. No evidence of acute fracture to the cervical spine. 2. Mild age-indeterminate T1 vertebral body anterior wedge compression deformity. 3. Cervical dextrocurvature. 4. Mild C4-C5 grade 1 anterolisthesis and C5-C6 grade 1 retrolisthesis. 5. Cervical spondylosis as described. Electronically Signed   By: Kellie Simmering DO   On: 06/15/2020 13:39       Assessment & Plan:   Problem List Items Addressed This Visit    Vitamin D deficiency    Follow vitamin D level.       Vision changes    Describes intermittent episodes of colored spots.  Occurs after eating out.  Discussed further w/up and evaluation.  Discussed scanning and carotid  evaluation.  Discussed eye exam.  Agrees to eye exam, but wants to hold on further w/up at this time.  Follow.        Relevant Orders   Ambulatory referral to Optometry   Stress    Overall feels he is handling things well.  Follow.       Skin thickening    Skin thickening - left groin.  Desires no further w/up or evaluation.       Health care maintenance    Physical today 07/02/20.  Declines psa and further up.        Elevated PSA    Last psa 6.18.  Declines further w/up or evaluation.       BPH with elevated PSA    On rapaflo.  Referred to urology.       Back pain, thoracic    Found to have compression fracture previously.  Saw neurosurgery.  Now seeing chiropractor.  Follow       Aortic stenosis    Noted on echo previously.  Harsh systolic murmur present.  Discussed again regarding f/u echo and f/u with cardiology - to monitor stenotic valve.  Has previously declined.  Agrees to day.  Refer to cardiology.       Relevant Orders   Ambulatory referral to Cardiology       Einar Pheasant, MD

## 2020-07-02 NOTE — Assessment & Plan Note (Addendum)
Physical today 07/02/20.  Declines psa and further up.

## 2020-07-09 ENCOUNTER — Telehealth: Payer: Self-pay | Admitting: *Deleted

## 2020-07-09 ENCOUNTER — Other Ambulatory Visit: Payer: Medicare HMO

## 2020-07-09 DIAGNOSIS — I350 Nonrheumatic aortic (valve) stenosis: Secondary | ICD-10-CM

## 2020-07-09 DIAGNOSIS — Z13220 Encounter for screening for lipoid disorders: Secondary | ICD-10-CM

## 2020-07-09 NOTE — Telephone Encounter (Signed)
Please place future orders for lab appt.  

## 2020-07-09 NOTE — Telephone Encounter (Signed)
Orders placed for labs

## 2020-07-12 ENCOUNTER — Encounter: Payer: Self-pay | Admitting: Internal Medicine

## 2020-07-12 DIAGNOSIS — H539 Unspecified visual disturbance: Secondary | ICD-10-CM | POA: Insufficient documentation

## 2020-07-12 DIAGNOSIS — R234 Changes in skin texture: Secondary | ICD-10-CM | POA: Insufficient documentation

## 2020-07-12 NOTE — Assessment & Plan Note (Signed)
Follow vitamin D level.  

## 2020-07-12 NOTE — Assessment & Plan Note (Signed)
Noted on echo previously.  Harsh systolic murmur present.  Discussed again regarding f/u echo and f/u with cardiology - to monitor stenotic valve.  Has previously declined.  Agrees to day.  Refer to cardiology.

## 2020-07-12 NOTE — Assessment & Plan Note (Signed)
Skin thickening - left groin.  Desires no further w/up or evaluation.

## 2020-07-12 NOTE — Assessment & Plan Note (Signed)
On rapaflo.  Referred to urology.

## 2020-07-12 NOTE — Assessment & Plan Note (Signed)
Describes intermittent episodes of colored spots.  Occurs after eating out.  Discussed further w/up and evaluation.  Discussed scanning and carotid evaluation.  Discussed eye exam.  Agrees to eye exam, but wants to hold on further w/up at this time.  Follow.

## 2020-07-12 NOTE — Assessment & Plan Note (Signed)
Found to have compression fracture previously.  Saw neurosurgery.  Now seeing chiropractor.  Follow

## 2020-07-12 NOTE — Assessment & Plan Note (Signed)
Last psa 6.18.  Declines further w/up or evaluation.

## 2020-07-12 NOTE — Assessment & Plan Note (Signed)
Overall feels he is handling things well.  Follow.

## 2020-07-13 ENCOUNTER — Other Ambulatory Visit (INDEPENDENT_AMBULATORY_CARE_PROVIDER_SITE_OTHER): Payer: Medicare HMO

## 2020-07-13 ENCOUNTER — Other Ambulatory Visit: Payer: Self-pay

## 2020-07-13 DIAGNOSIS — Z13220 Encounter for screening for lipoid disorders: Secondary | ICD-10-CM | POA: Diagnosis not present

## 2020-07-13 DIAGNOSIS — I350 Nonrheumatic aortic (valve) stenosis: Secondary | ICD-10-CM | POA: Diagnosis not present

## 2020-07-13 LAB — CBC WITH DIFFERENTIAL/PLATELET
Basophils Absolute: 0 10*3/uL (ref 0.0–0.1)
Basophils Relative: 1 % (ref 0.0–3.0)
Eosinophils Absolute: 0.1 10*3/uL (ref 0.0–0.7)
Eosinophils Relative: 2.4 % (ref 0.0–5.0)
HCT: 42.8 % (ref 39.0–52.0)
Hemoglobin: 14.2 g/dL (ref 13.0–17.0)
Lymphocytes Relative: 31.9 % (ref 12.0–46.0)
Lymphs Abs: 1.5 10*3/uL (ref 0.7–4.0)
MCHC: 33.3 g/dL (ref 30.0–36.0)
MCV: 89.9 fl (ref 78.0–100.0)
Monocytes Absolute: 0.4 10*3/uL (ref 0.1–1.0)
Monocytes Relative: 9.2 % (ref 3.0–12.0)
Neutro Abs: 2.6 10*3/uL (ref 1.4–7.7)
Neutrophils Relative %: 55.5 % (ref 43.0–77.0)
Platelets: 209 10*3/uL (ref 150.0–400.0)
RBC: 4.76 Mil/uL (ref 4.22–5.81)
RDW: 14.7 % (ref 11.5–15.5)
WBC: 4.8 10*3/uL (ref 4.0–10.5)

## 2020-07-13 LAB — COMPREHENSIVE METABOLIC PANEL
ALT: 7 U/L (ref 0–53)
AST: 23 U/L (ref 0–37)
Albumin: 4.2 g/dL (ref 3.5–5.2)
Alkaline Phosphatase: 79 U/L (ref 39–117)
BUN: 19 mg/dL (ref 6–23)
CO2: 27 mEq/L (ref 19–32)
Calcium: 9 mg/dL (ref 8.4–10.5)
Chloride: 102 mEq/L (ref 96–112)
Creatinine, Ser: 1.04 mg/dL (ref 0.40–1.50)
GFR: 64.89 mL/min (ref >60.00)
Glucose, Bld: 86 mg/dL (ref 70–99)
Potassium: 4.6 mEq/L (ref 3.5–5.1)
Sodium: 136 mEq/L (ref 135–145)
Total Bilirubin: 0.7 mg/dL (ref 0.2–1.2)
Total Protein: 6.5 g/dL (ref 6.0–8.3)

## 2020-07-13 LAB — LIPID PANEL
Cholesterol: 167 mg/dL (ref 0–200)
HDL: 64 mg/dL (ref >39.00)
LDL Cholesterol: 91 mg/dL (ref 0–99)
NonHDL: 102.51
Total CHOL/HDL Ratio: 3
Triglycerides: 56 mg/dL (ref 0.0–149.0)
VLDL: 11.2 mg/dL (ref 0.0–40.0)

## 2020-08-04 DIAGNOSIS — R972 Elevated prostate specific antigen [PSA]: Secondary | ICD-10-CM | POA: Diagnosis not present

## 2020-08-04 DIAGNOSIS — I358 Other nonrheumatic aortic valve disorders: Secondary | ICD-10-CM | POA: Diagnosis not present

## 2020-08-04 DIAGNOSIS — N400 Enlarged prostate without lower urinary tract symptoms: Secondary | ICD-10-CM | POA: Diagnosis not present

## 2020-08-04 DIAGNOSIS — I350 Nonrheumatic aortic (valve) stenosis: Secondary | ICD-10-CM | POA: Diagnosis not present

## 2020-08-24 ENCOUNTER — Encounter: Payer: Self-pay | Admitting: Family

## 2020-08-24 ENCOUNTER — Telehealth (INDEPENDENT_AMBULATORY_CARE_PROVIDER_SITE_OTHER): Payer: Medicare HMO | Admitting: Family

## 2020-08-24 ENCOUNTER — Telehealth: Payer: Self-pay | Admitting: Internal Medicine

## 2020-08-24 VITALS — Ht 66.0 in | Wt 145.0 lb

## 2020-08-24 DIAGNOSIS — J40 Bronchitis, not specified as acute or chronic: Secondary | ICD-10-CM | POA: Insufficient documentation

## 2020-08-24 DIAGNOSIS — R059 Cough, unspecified: Secondary | ICD-10-CM | POA: Diagnosis not present

## 2020-08-24 MED ORDER — BENZONATATE 100 MG PO CAPS: 100.0000 mg | ORAL_CAPSULE | Freq: Two times a day (BID) | ORAL | 1 refills | Status: DC | PRN

## 2020-08-24 NOTE — Telephone Encounter (Signed)
Just received.  Let me know if I need to do anything.

## 2020-08-24 NOTE — Telephone Encounter (Signed)
Patient stated he has nasal congestion, sore throat and cough. No fever, chills, nausea, vomiting, chest px, and SOB.

## 2020-08-24 NOTE — Telephone Encounter (Signed)
Patient's wife called in stated that her husband need a Zpack

## 2020-08-24 NOTE — Telephone Encounter (Signed)
Please triage him. Dr Nicki Reaper is not going to give antibiotics with out seeing him.

## 2020-08-24 NOTE — Assessment & Plan Note (Signed)
Duration 3 days. No acute respiratory distress and he is not labored in his speech. Advised that he is covid tested and have given him information to go to [facility] today to be tested versus coming here as he would like option to know results sooner. Suspect likely viral cough and have given him tessalon perles. Advised to call us with any concerns and to let us know results of covid test so we can refer him for [facility] clinic. He verbalized understanding of all.

## 2020-08-24 NOTE — Telephone Encounter (Signed)
Patient has not been tested for COVID. He stated he has these every year. Patient has not taken any medication to help with SX. Appointment has been scheduled for a telephone visit with Mable Paris, [place].

## 2020-08-24 NOTE — Progress Notes (Signed)
Verbal consent for services obtained from patient prior to services given to TELEPHONE visit:   Location of call:  provider at work patient at home  Names of all persons present for services: Mable Paris, NP and patient Acute visit Chief complaint:  Dry cough x 3 days ago, unchanged.  No nasal congestion, ear pain, sinus pain, sore throat, SOB, wheezing, CP  Endorses frontal HA, mild.   Taking robitussin DM, vitamin D without relief.  No h/o lung disease Never smoker No known covid contacts. Works in a school.    A/P/next steps:  Problem List Items Addressed This Visit      Respiratory   Bronchitis - Primary   Relevant Medications   benzonatate (TESSALON) 100 MG capsule     Other   Cough    Duration 3 days. No acute respiratory distress and he is not labored in his speech. Advised that he is covid tested and have given him information to go to [facility] today to be tested versus coming here as he would like option to know results sooner. Suspect likely viral cough and have given him tessalon perles. Advised to call us with any concerns and to let us know results of covid test so we can refer him for [facility] clinic. He verbalized understanding of all.           I spent 15 min  discussing plan of care over the phone.

## 2020-08-25 ENCOUNTER — Ambulatory Visit
Admission: EM | Admit: 2020-08-25 | Discharge: 2020-08-25 | Disposition: A | Discharge status: Home / Self Care | Payer: Medicare HMO | Attending: Family Medicine | Admitting: Family Medicine

## 2020-08-25 ENCOUNTER — Other Ambulatory Visit: Payer: Self-pay

## 2020-08-25 ENCOUNTER — Encounter: Payer: Self-pay | Admitting: Emergency Medicine

## 2020-08-25 DIAGNOSIS — R519 Headache, unspecified: Secondary | ICD-10-CM | POA: Diagnosis not present

## 2020-08-25 DIAGNOSIS — R0981 Nasal congestion: Secondary | ICD-10-CM | POA: Diagnosis not present

## 2020-08-25 DIAGNOSIS — R059 Cough, unspecified: Secondary | ICD-10-CM | POA: Insufficient documentation

## 2020-08-25 DIAGNOSIS — Z20822 Contact with and (suspected) exposure to covid-19: Secondary | ICD-10-CM | POA: Diagnosis not present

## 2020-08-25 DIAGNOSIS — B9789 Other viral agents as the cause of diseases classified elsewhere: Secondary | ICD-10-CM | POA: Diagnosis not present

## 2020-08-25 DIAGNOSIS — J988 Other specified respiratory disorders: Secondary | ICD-10-CM | POA: Insufficient documentation

## 2020-08-25 LAB — RESP PANEL BY RT-PCR (FLU A&B, COVID) ARPGX2
Influenza A by PCR: NEGATIVE
Influenza B by PCR: NEGATIVE
SARS Coronavirus 2 by RT PCR: NEGATIVE

## 2020-08-25 MED ORDER — HYDROCODONE-HOMATROPINE 5-1.5 MG/5ML PO SYRP
5.0000 mL | ORAL_SOLUTION | Freq: Four times a day (QID) | ORAL | 0 refills | Status: DC | PRN
Start: 2020-08-25 — End: 2020-11-02

## 2020-08-25 MED ORDER — PREDNISONE 50 MG PO TABS
ORAL_TABLET | ORAL | 0 refills | Status: DC
Start: 2020-08-25 — End: 2020-11-02

## 2020-08-25 NOTE — ED Provider Notes (Signed)
[facility]    CSN: [id number] Arrival date & time: 08/25/20  1306      History   Chief Complaint Chief Complaint  Patient presents with  . Cough  . Nasal Congestion  . Headache   HPI   86 year old male presents with the above complaints.  Patient reports that he is experiencing cough, runny nose, and headache.  Started on Saturday.  Cough is interfering with sleep.  This is his most predominant complaint.  No relieving factors.  No medications or interventions tried.  No fever.  No reported sick contacts.  No other complaints.  Past Medical History:  Diagnosis Date  . BPH (benign prostatic hypertrophy)   . Kidney stones   . Left wrist fracture 1984  . Osteoporosis   . Right wrist fracture 1996    Patient Active Problem List   Diagnosis Date Noted  . Bronchitis 08/24/2020  . Vision changes 07/12/2020  . Skin thickening 07/12/2020  . Visit for suture removal 06/23/2020  . Vitamin D deficiency 12/26/2019  . Rash of penis 08/07/2019  . Back pain, thoracic 08/05/2019  . Dysuria 08/05/2019  . Nocturia 08/05/2019  . Cough 05/27/2019  . Stress 10/20/2017  . Elevated PSA 10/11/2015  . Health care maintenance 04/08/2015  . Skin lesion 10/08/2014  . Rotator cuff tear 10/08/2014  . BPH with elevated PSA 12/16/2012  . Aortic stenosis 12/04/2012  . Osteoporosis 12/04/2012  . Kidney stones 12/04/2012    Past Surgical History:  Procedure Laterality Date  . HERNIA REPAIR Bilateral    inguinal  . TONSILLECTOMY         Home Medications    Prior to Admission medications   Medication Sig Start Date End Date Taking? Authorizing Provider  Ascorbic Acid (VITAMIN C) 500 MG CAPS Take by mouth 2 (two) times daily.   Yes [provider]  cholecalciferol (VITAMIN D3) 25 MCG (1000 UT) tablet Take 1,000 Units by mouth daily.   Yes [provider]  Multiple Vitamin (MULTIVITAMIN) capsule Take 1 capsule by mouth daily.   Yes [provider]  silodosin (RAPAFLO) 4 MG CAPS capsule TAKE 1 CAPSULE BY MOUTH DAILY WITH BREAKFAST 05/18/20  Yes Einar Pheasant, MD  HYDROcodone-homatropine [facility]) 5-1.5 MG/5ML syrup Take 5 mLs by mouth every 6 (six) hours as needed. 08/25/20   Coral Spikes, DO  predniSONE (DELTASONE) 50 MG tablet 1 tablet daily x 5 days 08/25/20   Coral Spikes, DO    Family History Family History  Problem Relation Age of Onset  . Cancer Mother        leukemia  . Heart disease Father        heart attack  . Cancer Brother        Lymphoma    Social History Social History   Tobacco Use  . Smoking status: Never Smoker  . Smokeless tobacco: Never Used  Vaping Use  . Vaping Use: Never used  Substance Use Topics  . Alcohol use: No    Alcohol/week: 0.0 standard drinks  . Drug use: No     Allergies   Codeine sulfate   Review of Systems Review of Systems  HENT: Positive for rhinorrhea.   Respiratory: Positive for cough.   Neurological: Positive for headaches.   Physical Exam Triage Vital Signs ED Triage Vitals  Enc Vitals Group     BP 08/25/20 1343 128/67     Pulse Rate 08/25/20 1343 80     Resp 08/25/20 1343 18  Temp 08/25/20 1343 98.5 F (36.9 C)     Temp Source 08/25/20 1343 Oral     SpO2 08/25/20 1343 97 %     Weight 08/25/20 1346 145 lb 1 oz (65.8 kg)     Height 08/25/20 1346 5\' 6"  (1.676 m)     Head Circumference --      Peak Flow --      Pain Score 08/25/20 1346 0     Pain Loc --      Pain Edu? --      Excl. in GC? --    Updated Vital Signs BP 128/67 (BP Location: Right Arm)   Pulse 80   Temp 98.5 F (36.9 C) (Oral)   Resp 18   Ht 5\' 6"  (1.676 m)   Wt 65.8 kg   SpO2 97%   BMI 23.41 kg/m   Visual Acuity Right Eye Distance:   Left Eye Distance:   Bilateral Distance:    Right Eye Near:   Left Eye Near:    Bilateral Near:     Physical Exam Vitals and nursing note reviewed.  Constitutional:      General: He is not in acute distress.    Appearance: Normal  appearance. He is not ill-appearing.  HENT:     Head: Normocephalic and atraumatic.     Mouth/Throat:     Mouth: Mucous membranes are moist.     Pharynx: Oropharynx is clear.  Eyes:     General:        Right eye: No discharge.        Left eye: No discharge.     Conjunctiva/sclera: Conjunctivae normal.  Cardiovascular:     Rate and Rhythm: Normal rate and regular rhythm.     Comments: Systolic murmur noted.  Consistent with aortic stenosis. Pulmonary:     Effort: Pulmonary effort is normal.     Breath sounds: Normal breath sounds. No wheezing, rhonchi or rales.  Neurological:     Mental Status: He is alert.  Psychiatric:        Mood and Affect: Mood normal.        Behavior: Behavior normal.    UC Treatments / Results  Labs (all labs ordered are listed, but only abnormal results are displayed) Labs Reviewed  RESP PANEL BY RT-PCR (FLU A&B, COVID) [id number]    EKG   Radiology No results found.  Procedures Procedures (including critical care time)  Medications Ordered in UC Medications - No data to display  Initial Impression / Assessment and Plan / UC Course  I have reviewed the triage vital signs and the nursing notes.  Pertinent labs & imaging results that were available during my care of the patient were reviewed by me and considered in my medical decision making (see chart for details).    86 year old male presents with a viral URI with cough.  Treating with prednisone and Hycodan.  Final Clinical Impressions(s) / UC Diagnoses   Final diagnoses:  Viral respiratory infection   Discharge Instructions   None    ED Prescriptions    Medication Sig Dispense Auth. Provider   predniSONE (DELTASONE) 50 MG tablet 1 tablet daily x 5 days 5 tablet Jauan Wohl G, DO   HYDROcodone-homatropine (HYCODAN) 5-1.5 MG/5ML syrup Take 5 mLs by mouth every 6 (six) hours as needed. 60 mL 10-10-2003, DO     PDMP not reviewed this encounter.   11-04-1983, DO 08/25/20  1520

## 2020-08-25 NOTE — ED Triage Notes (Signed)
Patient c/o cough, nasal congestion and headache x 4 days ago. Denies fever.

## 2020-08-26 ENCOUNTER — Ambulatory Visit (INDEPENDENT_AMBULATORY_CARE_PROVIDER_SITE_OTHER): Payer: Medicare HMO

## 2020-08-26 VITALS — Ht 66.0 in | Wt 145.0 lb

## 2020-08-26 DIAGNOSIS — Z0000 Encounter for general adult medical examination without abnormal findings: Secondary | ICD-10-CM

## 2020-08-26 NOTE — Patient Instructions (Addendum)
[name] , Thank you for taking time to come for your Medicare Wellness Visit. I appreciate your ongoing commitment to your health goals. Please review the following plan we discussed and let me know if I can assist you in the future.   These are the goals we discussed: Goals      Patient Stated   .  Maintain weight (pt-stated)      Keep weight 150lb-155lb       This is a list of the screening recommended for you and due dates:  Health Maintenance  Topic Date Due  . COVID-19 Vaccine (3 - Booster for Moderna series) 10/06/2020*  . Flu Shot  12/03/2020*  . Tetanus Vaccine  11/16/2020  . Pneumonia vaccines  Completed  *Topic was postponed. The date shown is not the original due date.    Immunizations Immunization History  Administered Date(s) Administered  . Moderna Sars-Covid-2 Vaccination 10/30/2019, 11/27/2019  . Pneumococcal Conjugate-13 04/03/2014  . Pneumococcal Polysaccharide-23 10/14/2016  . Tdap 11/17/2010   Keep all routine maintenance appointments.   Follow up 11/02/20 @ 3:00  Advanced directives:   Conditions/risks identified: none new.  Follow up in one year for your annual wellness visit.   Preventive Care 84 Years and Older, Male Preventive care refers to lifestyle choices and visits with your health care provider that can promote health and wellness. What does preventive care include?  A yearly physical exam. This is also called an annual well check.  Dental exams once or twice a year.  Routine eye exams. Ask your health care provider how often you should have your eyes checked.  Personal lifestyle choices, including:  Daily care of your teeth and gums.  Regular physical activity.  Eating a healthy diet.  Avoiding tobacco and drug use.  Limiting alcohol use.  Practicing safe sex.  Taking low doses of aspirin every day.  Taking vitamin and mineral supplements as recommended by your health care provider. What happens during an annual  well check? The services and screenings done by your health care provider during your annual well check will depend on your age, overall health, lifestyle risk factors, and family history of disease. Counseling  Your health care provider may ask you questions about your:  Alcohol use.  Tobacco use.  Drug use.  Emotional well-being.  Home and relationship well-being.  Sexual activity.  Eating habits.  History of falls.  Memory and ability to understand (cognition).  Work and work Statistician. Screening  You may have the following tests or measurements:  Height, weight, and BMI.  Blood pressure.  Lipid and cholesterol levels. These may be checked every 5 years, or more frequently if you are over 6 years old.  Skin check.  Lung cancer screening. You may have this screening every year starting at age 84 if you have a 30-pack-year history of smoking and currently smoke or have quit within the past 15 years.  Fecal occult blood test (FOBT) of the stool. You may have this test every year starting at age 84.  Flexible sigmoidoscopy or colonoscopy. You may have a sigmoidoscopy every 5 years or a colonoscopy every 10 years starting at age 84.  Prostate cancer screening. Recommendations will vary depending on your family history and other risks.  Hepatitis C blood test.  Hepatitis B blood test.  Sexually transmitted disease (STD) testing.  Diabetes screening. This is done by checking your blood sugar (glucose) after you have not eaten for a while (fasting). You may have this  done every 1-3 years.  Abdominal aortic aneurysm (AAA) screening. You may need this if you are a current or former smoker.  Osteoporosis. You may be screened starting at age 84 if you are at high risk. Talk with your health care provider about your test results, treatment options, and if necessary, the need for more tests. Vaccines  Your health care provider may recommend certain vaccines, such  as:  Influenza vaccine. This is recommended every year.  Tetanus, diphtheria, and acellular pertussis (Tdap, Td) vaccine. You may need a Td booster every 10 years.  Zoster vaccine. You may need this after age 84.  Pneumococcal 13-valent conjugate (PCV13) vaccine. One dose is recommended after age 84.  Pneumococcal polysaccharide (PPSV23) vaccine. One dose is recommended after age 84. Talk to your health care provider about which screenings and vaccines you need and how often you need them. This information is not intended to replace advice given to you by your health care provider. Make sure you discuss any questions you have with your health care provider. Document Released: 09/18/2015 Document Revised: 05/11/2016 Document Reviewed: 06/23/2015 Elsevier Interactive Patient Education  2017 [place] Prevention in the Home Falls can cause injuries. They can happen to people of all ages. There are many things you can do to make your home safe and to help prevent falls. What can I do on the outside of my home?  Regularly fix the edges of walkways and driveways and fix any cracks.  Remove anything that might make you trip as you walk through a door, such as a raised step or threshold.  Trim any bushes or trees on the path to your home.  Use bright outdoor lighting.  Clear any walking paths of anything that might make someone trip, such as rocks or tools.  Regularly check to see if handrails are loose or broken. Make sure that both sides of any steps have handrails.  Any raised decks and porches should have guardrails on the edges.  Have any leaves, snow, or ice cleared regularly.  Use sand or salt on walking paths during winter.  Clean up any spills in your garage right away. This includes oil or grease spills. What can I do in the bathroom?  Use night lights.  Install grab bars by the toilet and in the tub and shower. Do not use towel bars as grab bars.  Use  non-skid mats or decals in the tub or shower.  If you need to sit down in the shower, use a plastic, non-slip stool.  Keep the floor dry. Clean up any water that spills on the floor as soon as it happens.  Remove soap buildup in the tub or shower regularly.  Attach bath mats securely with double-sided non-slip rug tape.  Do not have throw rugs and other things on the floor that can make you trip. What can I do in the bedroom?  Use night lights.  Make sure that you have a light by your bed that is easy to reach.  Do not use any sheets or blankets that are too big for your bed. They should not hang down onto the floor.  Have a firm chair that has side arms. You can use this for support while you get dressed.  Do not have throw rugs and other things on the floor that can make you trip. What can I do in the kitchen?  Clean up any spills right away.  Avoid walking on wet floors.  Keep items that you use a lot in easy-to-reach places.  If you need to reach something above you, use a strong step stool that has a grab bar.  Keep electrical cords out of the way.  Do not use floor polish or wax that makes floors slippery. If you must use wax, use non-skid floor wax.  Do not have throw rugs and other things on the floor that can make you trip. What can I do with my stairs?  Do not leave any items on the stairs.  Make sure that there are handrails on both sides of the stairs and use them. Fix handrails that are broken or loose. Make sure that handrails are as long as the stairways.  Check any carpeting to make sure that it is firmly attached to the stairs. Fix any carpet that is loose or worn.  Avoid having throw rugs at the top or bottom of the stairs. If you do have throw rugs, attach them to the floor with carpet tape.  Make sure that you have a light switch at the top of the stairs and the bottom of the stairs. If you do not have them, ask someone to add them for you. What  else can I do to help prevent falls?  Wear shoes that:  Do not have high heels.  Have rubber bottoms.  Are comfortable and fit you well.  Are closed at the toe. Do not wear sandals.  If you use a stepladder:  Make sure that it is fully opened. Do not climb a closed stepladder.  Make sure that both sides of the stepladder are locked into place.  Ask someone to hold it for you, if possible.  Clearly mark and make sure that you can see:  Any grab bars or handrails.  First and last steps.  Where the edge of each step is.  Use tools that help you move around (mobility aids) if they are needed. These include:  Canes.  Walkers.  Scooters.  Crutches.  Turn on the lights when you go into a dark area. Replace any light bulbs as soon as they burn out.  Set up your furniture so you have a clear path. Avoid moving your furniture around.  If any of your floors are uneven, fix them.  If there are any pets around you, be aware of where they are.  Review your medicines with your doctor. Some medicines can make you feel dizzy. This can increase your chance of falling. Ask your doctor what other things that you can do to help prevent falls. This information is not intended to replace advice given to you by your health care provider. Make sure you discuss any questions you have with your health care provider. Document Released: 06/18/2009 Document Revised: 01/28/2016 Document Reviewed: 09/26/2014 Elsevier Interactive Patient Education  2017 [employer].

## 2020-08-26 NOTE — Progress Notes (Signed)
Subjective:   Marvin Farley is a 86 y.o. male who presents for Medicare Annual/Subsequent preventive examination.  Review of Systems    No ROS.  Medicare Wellness Virtual Visit.     Cardiac Risk Factors include: advanced age (>7555men, 69>65 women);male gender     Objective:    Today's Vitals   08/26/20 1033  Weight: 145 lb (65.8 kg)  Height: 5\' 6"  (1.676 m)   Body mass index is 23.4 kg/m.  Advanced Directives 08/26/2020 06/15/2020 08/26/2019 08/21/2018  Does Patient Have a Medical Advance Directive? No No No No  Does patient want to make changes to medical advance directive? - - - No - Patient declined  Would patient like information on creating a medical advance directive? No - Patient declined - No - Patient declined -    Current Medications (verified) Outpatient Encounter Medications as of 08/26/2020  Medication Sig  . Ascorbic Acid (VITAMIN C) 500 MG CAPS Take by mouth 2 (two) times daily.  . cholecalciferol (VITAMIN D3) 25 MCG (1000 UT) tablet Take 1,000 Units by mouth daily.  Marland Kitchen. HYDROcodone-homatropine (HYCODAN) 5-1.5 MG/5ML syrup Take 5 mLs by mouth every 6 (six) hours as needed.  . Multiple Vitamin (MULTIVITAMIN) capsule Take 1 capsule by mouth daily.  . predniSONE (DELTASONE) 50 MG tablet 1 tablet daily x 5 days  . silodosin (RAPAFLO) 4 MG CAPS capsule TAKE 1 CAPSULE BY MOUTH DAILY WITH BREAKFAST   No facility-administered encounter medications on file as of 08/26/2020.    Allergies (verified) Codeine sulfate   History: Past Medical History:  Diagnosis Date  . BPH (benign prostatic hypertrophy)   . Kidney stones   . Left wrist fracture 1984  . Osteoporosis   . Right wrist fracture 1996   Past Surgical History:  Procedure Laterality Date  . HERNIA REPAIR Bilateral    inguinal  . TONSILLECTOMY     Family History  Problem Relation Age of Onset  . Cancer Mother        leukemia  . Heart disease Father        heart attack  . Cancer Brother         Lymphoma   Social History   Socioeconomic History  . Marital status: Married    Spouse name: Not on file  . Number of children: Not on file  . Years of education: Not on file  . Highest education level: Not on file  Occupational History  . Not on file  Tobacco Use  . Smoking status: Never Smoker  . Smokeless tobacco: Never Used  Vaping Use  . Vaping Use: Never used  Substance and Sexual Activity  . Alcohol use: No    Alcohol/week: 0.0 standard drinks  . Drug use: No  . Sexual activity: Not on file  Other Topics Concern  . Not on file  Social History Narrative  . Not on file   Social Determinants of Health   Financial Resource Strain: Low Risk   . Difficulty of Paying Living Expenses: Not hard at all  Food Insecurity: No Food Insecurity  . Worried About Programme researcher, broadcasting/film/videounning Out of Food in the Last Year: Never true  . Ran Out of Food in the Last Year: Never true  Transportation Needs: No Transportation Needs  . Lack of Transportation (Medical): No  . Lack of Transportation (Non-Medical): No  Physical Activity: Not on file  Stress: No Stress Concern Present  . Feeling of Stress : Not at all  Social Connections: Unknown  .  Frequency of Communication with Friends and Family: Not on file  . Frequency of Social Gatherings with Friends and Family: Not on file  . Attends Religious Services: Not on file  . Active Member of Clubs or Organizations: Not on file  . Attends Archivist Meetings: Not on file  . Marital Status: Married    Tobacco Counseling Counseling given: Not Answered   Clinical Intake:  Pre-visit preparation completed: Yes        Diabetes: No  How often do you need to have someone help you when you read instructions, pamphlets, or other written materials from your doctor or pharmacy?: 1 - Never   Interpreter Needed?: No      Activities of Daily Living In your present state of health, do you have any difficulty performing the following  activities: 08/26/2020  Hearing? N  Vision? N  Difficulty concentrating or making decisions? N  Walking or climbing stairs? N  Dressing or bathing? N  Doing errands, shopping? N  Preparing Food and eating ? N  Using the Toilet? N  In the past six months, have you accidently leaked urine? N  Do you have problems with loss of bowel control? N  Managing your Medications? N  Managing your Finances? N  Housekeeping or managing your Housekeeping? N  Some recent data might be hidden    Patient Care Team: Einar Pheasant, MD as PCP - General (Internal Medicine)  Indicate any recent Medical Services you may have received from other than [facility] providers in the past year (date may be approximate).     Assessment:   This is a routine wellness examination for Marvin Farley.  I connected with [name] today by telephone and verified that I am speaking with the correct person using two identifiers. Location patient: home Location provider: work Persons participating in the virtual visit: patient, Marine scientist.    I discussed the limitations, risks, security and privacy concerns of performing an evaluation and management service by telephone and the availability of in person appointments. The patient expressed understanding and verbally consented to this telephonic visit.    Interactive audio and video telecommunications were attempted between this provider and patient, however failed, due to patient having technical difficulties OR patient did not have access to video capability.  We continued and completed visit with audio only.  Some vital signs may be absent or patient reported.   Hearing/Vision screen  Hearing Screening   125Hz  250Hz  500Hz  1000Hz  2000Hz  3000Hz  4000Hz  6000Hz  8000Hz   Right ear:           Left ear:           Comments: Patient is able to hear conversational tones without difficulty.  No issues reported.  Vision Screening Comments: Wears corrective lenses  Virtual visit He plans to  schedule an eye exam with his opthalmologist  Dietary issues and exercise activities discussed: Current Exercise Habits: Home exercise routine, Type of exercise: walking, Intensity: Mild  Regular diet Good water intake  Goals      Patient Stated   .  Maintain weight (pt-stated)      Keep weight 150lb-155lb      Depression Screen PHQ 2/9 Scores 08/26/2020 08/26/2019 08/05/2019 08/21/2018 10/13/2016 04/07/2016 04/06/2015  PHQ - 2 Score 0 0 0 0 0 0 0    Fall Risk Fall Risk  08/26/2019 08/22/2019 08/05/2019 08/21/2018 10/13/2016  Falls in the past year? 0 0 0 0 Yes  Number falls in past yr: - - 0 - -  Injury with Fall? - - - - Yes  Comment - - - - hit head and bent glasses   Risk for fall due to : - - - - Impaired balance/gait  Follow up Falls prevention discussed Falls evaluation completed Falls evaluation completed - -    FALL RISK PREVENTION PERTAINING TO THE HOME: Handrails in use when climbing stairs? Yes Home free of loose throw rugs in walkways, pet beds, electrical cords, etc? Yes  Adequate lighting in your home to reduce risk of falls? Yes   ASSISTIVE DEVICES UTILIZED TO PREVENT FALLS: Use of a cane, walker or w/c? Yes  Grab bars in the bathroom? No  Shower chair or bench in shower? No  Elevated toilet seat or a handicapped toilet? No   TIMED UP AND GO: Was the test performed? No. Virtual visit   Cognitive Function:     6CIT Screen 08/26/2020 08/26/2019 08/21/2018  What Year? 0 points 0 points 0 points  What month? 0 points 0 points 0 points  What time? 0 points 0 points 0 points  Count back from 20 0 points 0 points 0 points  Months in reverse 0 points 0 points 0 points  Repeat phrase 0 points 0 points 0 points  Total Score 0 0 0    Immunizations Immunization History  Administered Date(s) Administered  . Moderna Sars-Covid-2 Vaccination 10/30/2019, 11/27/2019  . Pneumococcal Conjugate-13 04/03/2014  . Pneumococcal Polysaccharide-23 10/14/2016  . Tdap  11/17/2010     Health Maintenance There are no preventive care reminders to display for this patient. Health Maintenance  Topic Date Due  . COVID-19 Vaccine (3 - Booster for Moderna series) 10/06/2020 (Originally 05/29/2020)  . INFLUENZA VACCINE  12/03/2020 (Originally 04/05/2020)  . TETANUS/TDAP  11/16/2020  . PNA vac Low Risk Adult  Completed   Colorectal cancer screening: No longer required.   Lung Cancer Screening: (Low Dose CT Chest recommended if Age 56-80 years, 30 pack-year currently smoking OR have quit w/in 15years.) does not qualify.   Hepatitis C Screening: does not qualify.  Vision Screening: Recommended annual ophthalmology exams for early detection of glaucoma and other disorders of the eye. Is the patient up to date with their annual eye exam?  Yes  Who is the provider or what is the name of the office in which the patient attends annual eye exams? Dr. Ellin Mayhew.  Dental Screening: Recommended annual dental exams for proper oral hygiene.  Community Resource Referral / Chronic Care Management: CRR required this visit?  No   CCM required this visit?  No      Plan:   Keep all routine maintenance appointments.   Follow up 11/02/20 @ 3:00  I have personally reviewed and noted the following in the patient's chart:   . Medical and social history . Use of alcohol, tobacco or illicit drugs  . Current medications and supplements . Functional ability and status . Nutritional status . Physical activity . Advanced directives . List of other physicians . Hospitalizations, surgeries, and ER visits in previous 12 months . Vitals . Screenings to include cognitive, depression, and falls . Referrals and appointments  In addition, I have reviewed and discussed with patient certain preventive protocols, quality metrics, and best practice recommendations. A written personalized care plan for preventive services as well as general preventive health recommendations were provided  to patient via mail.     Varney Biles, LPN   [id number]

## 2020-08-27 ENCOUNTER — Telehealth: Payer: Self-pay | Admitting: Family Medicine

## 2020-08-27 MED ORDER — HYDROCOD POLST-CPM POLST ER 10-8 MG/5ML PO SUER: 5.0000 mL | Freq: Two times a day (BID) | ORAL | 0 refills | Status: DC | PRN

## 2020-08-27 NOTE — Telephone Encounter (Signed)
Hycodan on back order. Rx for tussionex sent.  [facility]

## 2020-09-07 ENCOUNTER — Other Ambulatory Visit: Payer: Self-pay | Admitting: Internal Medicine

## 2020-11-02 ENCOUNTER — Ambulatory Visit (INDEPENDENT_AMBULATORY_CARE_PROVIDER_SITE_OTHER): Payer: Medicare HMO | Admitting: Internal Medicine

## 2020-11-02 ENCOUNTER — Other Ambulatory Visit: Payer: Self-pay

## 2020-11-02 ENCOUNTER — Encounter: Payer: Self-pay | Admitting: Internal Medicine

## 2020-11-02 VITALS — BP 120/70 | HR 90 | Temp 98.2°F | Resp 16 | Ht 66.0 in | Wt 143.4 lb

## 2020-11-02 DIAGNOSIS — H539 Unspecified visual disturbance: Secondary | ICD-10-CM | POA: Diagnosis not present

## 2020-11-02 DIAGNOSIS — R519 Headache, unspecified: Secondary | ICD-10-CM

## 2020-11-02 DIAGNOSIS — R059 Cough, unspecified: Secondary | ICD-10-CM

## 2020-11-02 DIAGNOSIS — R972 Elevated prostate specific antigen [PSA]: Secondary | ICD-10-CM

## 2020-11-02 DIAGNOSIS — R0981 Nasal congestion: Secondary | ICD-10-CM | POA: Diagnosis not present

## 2020-11-02 DIAGNOSIS — I350 Nonrheumatic aortic (valve) stenosis: Secondary | ICD-10-CM | POA: Diagnosis not present

## 2020-11-02 MED ORDER — AZITHROMYCIN 250 MG PO TABS: ORAL_TABLET | ORAL | 0 refills | Status: DC

## 2020-11-02 NOTE — Assessment & Plan Note (Signed)
Has declined further w/up or evaluation.

## 2020-11-02 NOTE — Assessment & Plan Note (Signed)
Increased sinus congestion and cough.  No chest pain or sob.  States had covid beginning of January - home test positive.  Symptoms completely resolved.  These symptoms started Saturday 10/31/20.  States feels like his typical sinus/allergy/seasonal symptoms.  Will obtain nasal swab.  Discussed covid.  Quarantine.  nasacort nasal spray as directed.  zpak as directed.  Follow closely.  Call with update.

## 2020-11-02 NOTE — Assessment & Plan Note (Signed)
Describes intermittent blurred vision as outlined.  Has been intermittent for one year or more.  Sometimes will have headaches associated.  Feels is bilateral.  No actual vision loss.  Discussed possible etiologies.  Denies any chest pain, increased heart rate or palpitations.  Blood pressure under good control.  He was questioning migraine headaches, but does not have a history of migraines.  Discussed possible TIA, etc.  Discussed further w/up including MRI, carotid ultrasound.  He is agreeable.  Discussed aspirin daily.

## 2020-11-02 NOTE — Assessment & Plan Note (Signed)
Noted on echo previously.  Systolic murmur - persistent.  Saw cardiology.  Had recommended echo and stress myoview.  He has not scheduled.  Discussed today.  Discussed the need to get these scheduled.  Notify me if agreeable.

## 2020-11-02 NOTE — Patient Instructions (Signed)
Saline nasal spray - flush nose at least 2x/day  nasacort nasal spray (or flonase) nasal spray 2 sprays each nostril one time per day.  Do this in the evening.    We are testing you for covid.  I want you to quarantine until we get your test results back.

## 2020-11-02 NOTE — Progress Notes (Addendum)
Patient ID: Marvin Farley, male   DOB: 30-Jul-1932, 87 y.o.   MRN: [id number]   Subjective:    Patient ID: Marvin Farley, male    DOB: 1932-04-17, 87 y.o.   MRN: [id number]  HPI This visit occurred during the SARS-CoV-2 public health emergency.  Safety protocols were in place, including screening questions prior to the visit, additional usage of staff PPE, and extensive cleaning of exam room while observing appropriate contact time as indicated for disinfecting solutions.  Patient here for a scheduled follow up.  Here to follow up regarding - history of aortic stenosis.  Saw cardiology 08/04/20.  They had recommended a f/u echo and stress myoview.  He has not scheduled this yet.  Stays active.  Denies any chest pain or sob.  Does report that he will have occasional episodes of blurred vision.  Last episode was yesterday.  May last 15-20 minutes.  No loss of vision.  States was sitting watching TV with this last episode.  On questioning him, he reports this has occurred intermittently over the last year.  May have 2-3 episodes a week on some occasions.  Has noticed some headache occasionally associated with the episodes.  No dizziness or light headedness.  No chest pain, increased heart rate or palpitations.  No nausea or vomiting.  He states he had covid in 09/2020.  Home test positive.  Was at the beginning of January.  Reports that starting this past weekend, he started having what he describes as his typical allergy/seasonal problems.  Reports increased nasal congestion.  Some chest congestion and cough.  No sob.  No wheezing.  No nausea or vomiting.  Bowels moving.     Past Medical History:  Diagnosis Date  . BPH (benign prostatic hypertrophy)   . Kidney stones   . Left wrist fracture 1984  . Osteoporosis   . Right wrist fracture 1996   Past Surgical History:  Procedure Laterality Date  . HERNIA REPAIR Bilateral    inguinal  . TONSILLECTOMY     Family History  Problem Relation Age of  Onset  . Cancer Mother        leukemia  . Heart disease Father        heart attack  . Cancer Brother        Lymphoma   Social History   Socioeconomic History  . Marital status: Married    Spouse name: Not on file  . Number of children: Not on file  . Years of education: Not on file  . Highest education level: Not on file  Occupational History  . Not on file  Tobacco Use  . Smoking status: Never Smoker  . Smokeless tobacco: Never Used  Vaping Use  . Vaping Use: Never used  Substance and Sexual Activity  . Alcohol use: No    Alcohol/week: 0.0 standard drinks  . Drug use: No  . Sexual activity: Not on file  Other Topics Concern  . Not on file  Social History Narrative  . Not on file   Social Determinants of Health   Financial Resource Strain: Low Risk   . Difficulty of Paying Living Expenses: Not hard at all  Food Insecurity: No Food Insecurity  . Worried About Charity fundraiser in the Last Year: Never true  . Ran Out of Food in the Last Year: Never true  Transportation Needs: No Transportation Needs  . Lack of Transportation (Medical): No  . Lack of Transportation (Non-Medical): No  Physical  Activity: Not on file  Stress: No Stress Concern Present  . Feeling of Stress : Not at all  Social Connections: Unknown  . Frequency of Communication with Friends and Family: Not on file  . Frequency of Social Gatherings with Friends and Family: Not on file  . Attends Religious Services: Not on file  . Active Member of Clubs or Organizations: Not on file  . Attends Archivist Meetings: Not on file  . Marital Status: Married    Outpatient Encounter Medications as of 11/02/2020  Medication Sig  . Ascorbic Acid (VITAMIN C) 500 MG CAPS Take by mouth 2 (two) times daily.  Marland Kitchen azithromycin (ZITHROMAX) 250 MG tablet Take 2 tablets x 1 day and then one tablet per day for four more days.  . cholecalciferol (VITAMIN D3) 25 MCG (1000 UT) tablet Take 1,000 Units by mouth  daily.  . Multiple Vitamin (MULTIVITAMIN) capsule Take 1 capsule by mouth daily.  . silodosin (RAPAFLO) 4 MG CAPS capsule TAKE 1 CAPSULE BY MOUTH DAILY WITH BREAKFAST  . [DISCONTINUED] chlorpheniramine-HYDROcodone (TUSSIONEX PENNKINETIC ER) 10-8 MG/5ML SUER Take 5 mLs by mouth every 12 (twelve) hours as needed.  . [DISCONTINUED] HYDROcodone-homatropine (HYCODAN) 5-1.5 MG/5ML syrup Take 5 mLs by mouth every 6 (six) hours as needed.  . [DISCONTINUED] predniSONE (DELTASONE) 50 MG tablet 1 tablet daily x 5 days   No facility-administered encounter medications on file as of 11/02/2020.    Review of Systems  Constitutional: Negative for appetite change and fever.  HENT: Positive for congestion, postnasal drip and sinus pressure.   Respiratory: Positive for cough. Negative for chest tightness and shortness of breath.   Cardiovascular: Negative for chest pain, palpitations and leg swelling.  Gastrointestinal: Negative for abdominal pain, diarrhea, nausea and vomiting.  Genitourinary: Negative for difficulty urinating and dysuria.  Musculoskeletal: Negative for joint swelling and myalgias.  Skin: Negative for color change and rash.  Neurological: Negative for dizziness, light-headedness and headaches.  Psychiatric/Behavioral: Negative for agitation and dysphoric mood.       Objective:    Physical Exam Vitals reviewed.  Constitutional:      General: He is not in acute distress.    Appearance: Normal appearance. He is well-developed and well-nourished.  HENT:     Head: Normocephalic and atraumatic.     Right Ear: External ear normal.     Left Ear: External ear normal.     Nose:     Comments: Slightly erythematous turbinates.  Eyes:     General: No scleral icterus.       Right eye: No discharge.        Left eye: No discharge.     Conjunctiva/sclera: Conjunctivae normal.  Cardiovascular:     Rate and Rhythm: Normal rate and regular rhythm.  Pulmonary:     Effort: Pulmonary effort is  normal. No respiratory distress.     Breath sounds: Normal breath sounds.  Abdominal:     General: Bowel sounds are normal.     Palpations: Abdomen is soft.     Tenderness: There is no abdominal tenderness.  Musculoskeletal:        General: No swelling, tenderness or edema.     Cervical back: Neck supple. No tenderness.  Lymphadenopathy:     Cervical: No cervical adenopathy.  Skin:    Findings: No erythema or rash.  Neurological:     Mental Status: He is alert.  Psychiatric:        Mood and Affect: Mood and affect and mood  normal.        Behavior: Behavior normal.     BP 120/70   Pulse 90   Temp 98.2 F (36.8 C) (Oral)   Resp 16   Ht 5\' 6"  (1.676 m)   Wt 143 lb 6.4 oz (65 kg)   SpO2 98%   BMI 23.15 kg/m  Wt Readings from Last 3 Encounters:  11/02/20 143 lb 6.4 oz (65 kg)  08/26/20 145 lb (65.8 kg)  08/25/20 145 lb 1 oz (65.8 kg)     Lab Results  Component Value Date   WBC 4.8 07/13/2020   HGB 14.2 07/13/2020   HCT 42.8 07/13/2020   PLT 209.0 07/13/2020   GLUCOSE 86 07/13/2020   CHOL 167 07/13/2020   TRIG 56.0 07/13/2020   HDL 64.00 07/13/2020   LDLCALC 91 07/13/2020   ALT 7 07/13/2020   AST 23 07/13/2020   NA 136 07/13/2020   K 4.6 07/13/2020   CL 102 07/13/2020   CREATININE 1.04 07/13/2020   BUN 19 07/13/2020   CO2 27 07/13/2020   TSH 1.28 12/26/2019   PSA 6.18 (H) 12/26/2019       Assessment & Plan:   Problem List Items Addressed This Visit    Aortic stenosis    Noted on echo previously.  Systolic murmur - persistent.  Saw cardiology.  Had recommended echo and stress myoview.  He has not scheduled.  Discussed today.  Discussed the need to get these scheduled.  Notify me if agreeable.        Relevant Orders   MR Brain Wo Contrast   Cough - Primary    Increased sinus congestion and cough.  No chest pain or sob.  States had covid beginning of January - home test positive.  Symptoms completely resolved.  These symptoms started Saturday 10/31/20.   States feels like his typical sinus/allergy/seasonal symptoms.  Will obtain nasal swab.  Discussed covid.  Quarantine.  nasacort nasal spray as directed.  zpak as directed.  Follow closely.  Call with update.        Relevant Orders   COVID-19, Flu A+B and RSV (Completed)   Elevated PSA    Has declined further w/up or evaluation.       Vision changes    Describes intermittent blurred vision as outlined.  Has been intermittent for one year or more.  Sometimes will have headaches associated.  Feels is bilateral.  No actual vision loss.  Discussed possible etiologies.  Denies any chest pain, increased heart rate or palpitations.  Blood pressure under good control.  He was questioning migraine headaches, but does not have a history of migraines.  Discussed possible TIA, etc.  Discussed further w/up including MRI, carotid ultrasound.  He is agreeable.  Discussed aspirin daily.        Relevant Orders   MR Brain Wo Contrast    Other Visit Diagnoses    Head congestion       Relevant Orders   COVID-19, Flu A+B and RSV (Completed)   Nonintractable headache, unspecified chronicity pattern, unspecified headache type       Relevant Orders   MR Brain Wo Contrast       Einar Pheasant, MD

## 2020-11-03 LAB — COVID-19, FLU A+B AND RSV
Influenza A, NAA: NOT DETECTED
Influenza B, NAA: NOT DETECTED
RSV, NAA: NOT DETECTED
SARS-CoV-2, NAA: NOT DETECTED

## 2020-11-04 NOTE — Addendum Note (Signed)
Addended by: Alisa Graff on: 11/04/2020 01:33 PM   Modules accepted: Orders

## 2020-11-14 ENCOUNTER — Ambulatory Visit: Payer: Medicare HMO

## 2020-11-24 ENCOUNTER — Ambulatory Visit
Admission: RE | Admit: 2020-11-24 | Discharge: 2020-11-24 | Disposition: A | Discharge status: Home / Self Care | Payer: Medicare HMO | Source: Ambulatory Visit | Attending: Internal Medicine | Admitting: Internal Medicine

## 2020-11-24 ENCOUNTER — Other Ambulatory Visit: Payer: Self-pay

## 2020-11-24 DIAGNOSIS — H539 Unspecified visual disturbance: Secondary | ICD-10-CM

## 2020-11-24 DIAGNOSIS — R519 Headache, unspecified: Secondary | ICD-10-CM | POA: Insufficient documentation

## 2020-11-24 DIAGNOSIS — I350 Nonrheumatic aortic (valve) stenosis: Secondary | ICD-10-CM | POA: Insufficient documentation

## 2020-12-24 DIAGNOSIS — H2513 Age-related nuclear cataract, bilateral: Secondary | ICD-10-CM | POA: Diagnosis not present

## 2020-12-24 DIAGNOSIS — H25013 Cortical age-related cataract, bilateral: Secondary | ICD-10-CM | POA: Diagnosis not present

## 2020-12-24 DIAGNOSIS — H40013 Open angle with borderline findings, low risk, bilateral: Secondary | ICD-10-CM | POA: Diagnosis not present

## 2021-02-16 ENCOUNTER — Telehealth: Payer: Self-pay

## 2021-02-16 DIAGNOSIS — R110 Nausea: Secondary | ICD-10-CM | POA: Diagnosis not present

## 2021-02-16 DIAGNOSIS — R109 Unspecified abdominal pain: Secondary | ICD-10-CM | POA: Diagnosis not present

## 2021-02-16 DIAGNOSIS — R399 Unspecified symptoms and signs involving the genitourinary system: Secondary | ICD-10-CM | POA: Diagnosis not present

## 2021-02-16 DIAGNOSIS — R1030 Lower abdominal pain, unspecified: Secondary | ICD-10-CM | POA: Diagnosis not present

## 2021-02-16 NOTE — Telephone Encounter (Signed)
Received a call from Marvin Farley's wife. She states that he has kidney stones and that he has been in a lot of pain. She was not on his DPR so I requested to speak with Marvin Farley. Marvin Farley states that his lower abdomen, right above his privates have been hurting for 2 days. He rates it a 10 on the pain scale. He declines any blood in his urine and states that it hurts when he is urinating. He requested being seen today for the pain. I informed him that Dr. Nicki Reaper did not have any availabilities for today and advised going to [facility]. Marvin Farley agreed and states he will go for a walk in appointment.

## 2021-02-16 NOTE — Telephone Encounter (Signed)
LM on both numbers for either wife or patient to call back. We wanted to make sure that he was seen.

## 2021-02-16 NOTE — Telephone Encounter (Signed)
Agree with evaluation today if 10/10 pain.  Please confirm pt being seen.

## 2021-02-16 NOTE — Telephone Encounter (Signed)
Patient called in c/o Lower Abdominal Pain.

## 2021-02-17 NOTE — Telephone Encounter (Signed)
Per chart, patient was evaluated at [facility] and treated with Levaquin for 14 days for UTI symptoms. Notes are in [facility].

## 2021-02-18 ENCOUNTER — Telehealth: Payer: Self-pay

## 2021-02-18 LAB — BASIC METABOLIC PANEL
BUN: 19 (ref 4–21)
CO2: 24 — AB (ref 13–22)
Chloride: 101 (ref 99–108)
Creatinine: 1 (ref 0.6–1.3)
Glucose: 114
Potassium: 4.3 (ref 3.4–5.3)
Sodium: 134 — AB (ref 137–147)

## 2021-02-18 LAB — CBC AND DIFFERENTIAL
HCT: 42 (ref 41–53)
Hemoglobin: 14.1 (ref 13.5–17.5)
Platelets: 208 (ref 150–399)
WBC: 12.1

## 2021-02-18 LAB — CBC: RBC: 4.72 (ref 3.87–5.11)

## 2021-02-18 LAB — COMPREHENSIVE METABOLIC PANEL
Calcium: 8.8 (ref 8.7–10.7)
GFR calc non Af Amer: 71

## 2021-02-18 NOTE — Telephone Encounter (Signed)
Please hold until receive information.  Also need culture result if available.

## 2021-02-18 NOTE — Telephone Encounter (Signed)
Spoke with patient and advised of below. He will finish abx on 6/28. Wants to f/u after. Scheduled him for 7/5 per his request. Will let us know if he needs something sooner or be re-evaluated if symptoms change or worsen

## 2021-02-18 NOTE — Telephone Encounter (Signed)
I cannot see the office note from acute care.  Can they send this over as well.  Also, do need to xray reports asap (you have already requested).  Let him know that we are trying to get the records.  Since he is getting better with abx, I do recommend continuing.  Take a probiotic daily while on abx and for two weeks after completing abx.  Does need a f/u appt with me.  Any change or worsening symptoms, he is to be reevaluated.

## 2021-02-18 NOTE — Telephone Encounter (Signed)
Symptoms started Sunday evening- Severe lower abdominal pain on the left side almost in pelvic area and says it was very painful to urinate. Called Monday and went to urgent care due to 10/10 pain and was treated for UTI x14 days. Urine is clear for infection but positive for blood. Patient has not noticed any blood in his urine and says his urine is not dark. After 2 days of abx he is feeling a lot better but he is still having a dull pain in his lower back right below his kidney on the left side. He is urinating a lot better than he was before starting the levaquin. He is also drinking cranberry juice. Painful urination has resolved. Patient denies any fever, chills, bloating, nausea, vomiting, etc. Last BM (normal for him) Monday evening and he normally has atleast one and sometimes 2 per day (seemed a little concerned that this may be related to his intestines but pain has moved to his back). He says they did xrays on abdomen and back at [place] and they did not see anything abnormal. He says that when he went to walk in he was convinced that he had a kidney stone because he has had them in the past. He is wanting to know should he continue the antibiotics since he is feeling significantly better and then follow up with you or see you this week? UC recommended him follow up with you. I have requested xrays be sent over to [place].

## 2021-02-18 NOTE — Telephone Encounter (Signed)
See note

## 2021-02-18 NOTE — Telephone Encounter (Signed)
Pt called in at [place] asking for an appointment.

## 2021-02-18 NOTE — Telephone Encounter (Signed)
Called and requested note as well

## 2021-02-18 NOTE — Telephone Encounter (Signed)
Pt called and wants to talk to you about his care at [facility] and needs clarification. No appts available at the time of call to speak with Dr Nicki Reaper.

## 2021-02-19 NOTE — Telephone Encounter (Signed)
Records received from [place]. Placed out for review

## 2021-02-19 NOTE — Telephone Encounter (Signed)
Reviewed records. Please call and confirm he is continuing to improve.  Take probiotic daily as discussed.  Hold records for upcoming appt.

## 2021-02-19 NOTE — Telephone Encounter (Signed)
LMTCB

## 2021-02-22 ENCOUNTER — Other Ambulatory Visit: Payer: Self-pay | Admitting: Internal Medicine

## 2021-02-22 NOTE — Telephone Encounter (Signed)
Patient would like a 90 day supply  of his silodosin (RAPAFLO) 4 MG CAPS capsule

## 2021-02-22 NOTE — Addendum Note (Signed)
Addended by: Ezequiel Ganser on: 02/22/2021 04:24 PM   Modules accepted: Orders

## 2021-02-22 NOTE — Telephone Encounter (Signed)
Pt requesting a 90 day supply of Silodosin. Pt last seen on 11/02/20 Medication last refilled on 09/07/2020  Ok to send a 90 day supply

## 2021-02-23 MED ORDER — SILODOSIN 4 MG PO CAPS: 4.0000 mg | ORAL_CAPSULE | Freq: Every day | ORAL | 0 refills | Status: DC

## 2021-02-23 NOTE — Telephone Encounter (Signed)
Rx sent in for 90 day supply.  Accidentally sent in for 30 day supply as well.  Please notify pharmacy to refill for 90 day.  Thanks

## 2021-02-23 NOTE — Addendum Note (Signed)
Addended by: Alisa Graff on: 02/23/2021 04:09 AM   Modules accepted: Orders

## 2021-02-24 DIAGNOSIS — H2589 Other age-related cataract: Secondary | ICD-10-CM | POA: Diagnosis not present

## 2021-02-24 DIAGNOSIS — H524 Presbyopia: Secondary | ICD-10-CM | POA: Diagnosis not present

## 2021-02-24 DIAGNOSIS — H40013 Open angle with borderline findings, low risk, bilateral: Secondary | ICD-10-CM | POA: Diagnosis not present

## 2021-02-24 DIAGNOSIS — H2511 Age-related nuclear cataract, right eye: Secondary | ICD-10-CM | POA: Diagnosis not present

## 2021-02-24 DIAGNOSIS — H25013 Cortical age-related cataract, bilateral: Secondary | ICD-10-CM | POA: Diagnosis not present

## 2021-02-24 DIAGNOSIS — H2513 Age-related nuclear cataract, bilateral: Secondary | ICD-10-CM | POA: Diagnosis not present

## 2021-02-24 NOTE — Telephone Encounter (Signed)
I wa finally able to reach patient & he was at his eye doctor appointment. He stated that he was doing much better & improving. He was rushed, so did not give much detail. I asked that he call back if needed or if he felt he needed to be seen by someone sooner than 7/5.

## 2021-03-09 ENCOUNTER — Ambulatory Visit (INDEPENDENT_AMBULATORY_CARE_PROVIDER_SITE_OTHER): Payer: Medicare HMO | Admitting: Internal Medicine

## 2021-03-09 ENCOUNTER — Other Ambulatory Visit: Payer: Self-pay

## 2021-03-09 VITALS — BP 114/70 | HR 83 | Temp 97.8°F | Resp 16 | Ht 66.0 in | Wt 142.8 lb

## 2021-03-09 DIAGNOSIS — D72829 Elevated white blood cell count, unspecified: Secondary | ICD-10-CM | POA: Diagnosis not present

## 2021-03-09 DIAGNOSIS — R972 Elevated prostate specific antigen [PSA]: Secondary | ICD-10-CM

## 2021-03-09 DIAGNOSIS — I350 Nonrheumatic aortic (valve) stenosis: Secondary | ICD-10-CM | POA: Diagnosis not present

## 2021-03-09 DIAGNOSIS — E871 Hypo-osmolality and hyponatremia: Secondary | ICD-10-CM

## 2021-03-09 DIAGNOSIS — N400 Enlarged prostate without lower urinary tract symptoms: Secondary | ICD-10-CM

## 2021-03-09 LAB — LIPID PANEL
Cholesterol: 168 mg/dL (ref 0–200)
HDL: 61 mg/dL (ref >39.00)
LDL Cholesterol: 92 mg/dL (ref 0–99)
NonHDL: 106.77
Total CHOL/HDL Ratio: 3
Triglycerides: 72 mg/dL (ref 0.0–149.0)
VLDL: 14.4 mg/dL (ref 0.0–40.0)

## 2021-03-09 LAB — CBC WITH DIFFERENTIAL/PLATELET
Basophils Absolute: 0 10*3/uL (ref 0.0–0.1)
Basophils Relative: 0.9 % (ref 0.0–3.0)
Eosinophils Absolute: 0.2 10*3/uL (ref 0.0–0.7)
Eosinophils Relative: 3.3 % (ref 0.0–5.0)
HCT: 41.7 % (ref 39.0–52.0)
Hemoglobin: 14 g/dL (ref 13.0–17.0)
Lymphocytes Relative: 35.6 % (ref 12.0–46.0)
Lymphs Abs: 1.7 10*3/uL (ref 0.7–4.0)
MCHC: 33.6 g/dL (ref 30.0–36.0)
MCV: 89 fl (ref 78.0–100.0)
Monocytes Absolute: 0.5 10*3/uL (ref 0.1–1.0)
Monocytes Relative: 10.4 % (ref 3.0–12.0)
Neutro Abs: 2.3 10*3/uL (ref 1.4–7.7)
Neutrophils Relative %: 49.8 % (ref 43.0–77.0)
Platelets: 211 10*3/uL (ref 150.0–400.0)
RBC: 4.69 Mil/uL (ref 4.22–5.81)
RDW: 15 % (ref 11.5–15.5)
WBC: 4.7 10*3/uL (ref 4.0–10.5)

## 2021-03-09 LAB — BASIC METABOLIC PANEL
BUN: 20 mg/dL (ref 6–23)
CO2: 27 mEq/L (ref 19–32)
Calcium: 9.4 mg/dL (ref 8.4–10.5)
Chloride: 103 mEq/L (ref 96–112)
Creatinine, Ser: 1.01 mg/dL (ref 0.40–1.50)
GFR: 66.91 mL/min (ref >60.00)
Glucose, Bld: 79 mg/dL (ref 70–99)
Potassium: 4.4 mEq/L (ref 3.5–5.1)
Sodium: 138 mEq/L (ref 135–145)

## 2021-03-09 LAB — HEPATIC FUNCTION PANEL
ALT: 7 U/L (ref 0–53)
AST: 18 U/L (ref 0–37)
Albumin: 4.2 g/dL (ref 3.5–5.2)
Alkaline Phosphatase: 72 U/L (ref 39–117)
Bilirubin, Direct: 0.1 mg/dL (ref 0.0–0.3)
Total Bilirubin: 0.6 mg/dL (ref 0.2–1.2)
Total Protein: 6.8 g/dL (ref 6.0–8.3)

## 2021-03-09 NOTE — Progress Notes (Signed)
Patient ID: Marvin Farley, male   DOB: 01/27/1932, 87 y.o.   MRN: [id number]   Subjective:    Patient ID: Marvin Farley, male    DOB: [birth year], 87 y.o.   MRN: [id number]  HPI This visit occurred during the SARS-CoV-2 public health emergency.  Safety protocols were in place, including screening questions prior to the visit, additional usage of staff PPE, and extensive cleaning of exam room while observing appropriate contact time as indicated for disinfecting solutions.   Patient here for work in appt.  Work in for urgent care follow up. Was evaluated 02/16/21 for suprapubic pain, increased urinary frequency and uncomfortable urination.  Urine checked and placed on levaquin.  Took the abx for approximately four days.  Continues on rapaflo.  No urinary symptoms now.  No abdominal pain or groin pain now.  May get up approximately 2x/night.  This is back to baseline.  Stays active.  No chest pain or sob reported.  No abdominal pain or bowel change.  Overall feels good.   Past Medical History:  Diagnosis Date   BPH (benign prostatic hypertrophy)    Kidney stones    Left wrist fracture 1984   Osteoporosis    Right wrist fracture 1996   Past Surgical History:  Procedure Laterality Date   HERNIA REPAIR Bilateral    inguinal   TONSILLECTOMY     Family History  Problem Relation Age of Onset   Cancer Mother        leukemia   Heart disease Father        heart attack   Cancer Brother        Lymphoma   Social History   Socioeconomic History   Marital status: Married    Spouse name: Not on file   Number of children: Not on file   Years of education: Not on file   Highest education level: Not on file  Occupational History   Not on file  Tobacco Use   Smoking status: Never   Smokeless tobacco: Never  Vaping Use   Vaping Use: Never used  Substance and Sexual Activity   Alcohol use: No    Alcohol/week: 0.0 standard drinks   Drug use: No   Sexual activity: Not on file  Other  Topics Concern   Not on file  Social History Narrative   Not on file   Social Determinants of Health   Financial Resource Strain: Low Risk    Difficulty of Paying Living Expenses: Not hard at all  Food Insecurity: No Food Insecurity   Worried About Charity fundraiser in the Last Year: Never true   [place] in the Last Year: Never true  Transportation Needs: No Transportation Needs   Lack of Transportation (Medical): No   Lack of Transportation (Non-Medical): No  Physical Activity: Not on file  Stress: No Stress Concern Present   Feeling of Stress : Not at all  Social Connections: Unknown   Frequency of Communication with Friends and Family: Not on file   Frequency of Social Gatherings with Friends and Family: Not on file   Attends Religious Services: Not on file   Active Member of Clubs or Organizations: Not on file   Attends Archivist Meetings: Not on file   Marital Status: Married    Review of Systems  Constitutional:  Negative for appetite change and unexpected weight change.  HENT:  Negative for congestion and sinus pressure.   Respiratory:  Negative for  cough, chest tightness and shortness of breath.   Cardiovascular:  Negative for chest pain, palpitations and leg swelling.  Gastrointestinal:  Negative for abdominal pain, diarrhea, nausea and vomiting.  Genitourinary:  Negative for difficulty urinating and dysuria.  Musculoskeletal:  Negative for joint swelling and myalgias.  Skin:  Negative for color change and rash.  Neurological:  Negative for dizziness, light-headedness and headaches.  Psychiatric/Behavioral:  Negative for agitation and dysphoric mood.       Objective:    Physical Exam Vitals reviewed.  Constitutional:      General: He is not in acute distress.    Appearance: Normal appearance. He is well-developed.  HENT:     Head: Normocephalic and atraumatic.     Right Ear: External ear normal.     Left Ear: External ear normal.  Eyes:      General: No scleral icterus.       Right eye: No discharge.        Left eye: No discharge.     Conjunctiva/sclera: Conjunctivae normal.  Cardiovascular:     Rate and Rhythm: Normal rate and regular rhythm.     Comments: 2/6 systolic murmur.  Pulmonary:     Effort: Pulmonary effort is normal. No respiratory distress.     Breath sounds: Normal breath sounds.  Abdominal:     General: Bowel sounds are normal.     Palpations: Abdomen is soft.     Tenderness: There is no abdominal tenderness.  Musculoskeletal:        General: No swelling or tenderness.     Cervical back: Neck supple. No tenderness.  Lymphadenopathy:     Cervical: No cervical adenopathy.  Skin:    Findings: No erythema or rash.  Neurological:     Mental Status: He is alert.  Psychiatric:        Mood and Affect: Mood normal.        Behavior: Behavior normal.    BP 114/70   Pulse 83   Temp 97.8 F (36.6 C)   Resp 16   Ht 5\' 6"  (1.676 m)   Wt 142 lb 12.8 oz (64.8 kg)   SpO2 99%   BMI 23.05 kg/m  Wt Readings from Last 3 Encounters:  03/09/21 142 lb 12.8 oz (64.8 kg)  11/02/20 143 lb 6.4 oz (65 kg)  08/26/20 145 lb (65.8 kg)    Outpatient Encounter Medications as of 03/09/2021  Medication Sig   Ascorbic Acid (VITAMIN C) 500 MG CAPS Take by mouth 2 (two) times daily.   cholecalciferol (VITAMIN D3) 25 MCG (1000 UT) tablet Take 1,000 Units by mouth daily.   Multiple Vitamin (MULTIVITAMIN) capsule Take 1 capsule by mouth daily.   silodosin (RAPAFLO) 4 MG CAPS capsule Take 1 capsule (4 mg total) by mouth daily with breakfast.   [DISCONTINUED] azithromycin (ZITHROMAX) 250 MG tablet Take 2 tablets x 1 day and then one tablet per day for four more days. (Patient not taking: Reported on 03/09/2021)   No facility-administered encounter medications on file as of 03/09/2021.     Lab Results  Component Value Date   WBC 4.7 03/09/2021   HGB 14.0 03/09/2021   HCT 41.7 03/09/2021   PLT 211.0 03/09/2021   GLUCOSE 79  03/09/2021   CHOL 168 03/09/2021   TRIG 72.0 03/09/2021   HDL 61.00 03/09/2021   LDLCALC 92 03/09/2021   ALT 7 03/09/2021   AST 18 03/09/2021   NA 138 03/09/2021   K 4.4 03/09/2021  CL 103 03/09/2021   CREATININE 1.01 03/09/2021   BUN 20 03/09/2021   CO2 27 03/09/2021   TSH 1.28 12/26/2019   PSA 6.18 (H) 12/26/2019    MR Brain Wo Contrast  Result Date: 11/24/2020 CLINICAL DATA:  Aortic valve stenosis. Vision changes. Non intractable headache. EXAM: MRI HEAD WITHOUT CONTRAST TECHNIQUE: Multiplanar, multiecho pulse sequences of the brain and surrounding structures were obtained without intravenous contrast. COMPARISON:  Head CT June 15, 2020 FINDINGS: Brain: No acute infarction, hemorrhage, hydrocephalus, extra-axial collection or mass lesion. A few scattered foci of T2 hyperintensity within the white matter of the cerebral hemispheres, nonspecific, most likely related to mild chronic microangiopathic changes. Moderate parenchymal volume loss, predominantly central. Vascular: Normal flow voids. Skull and upper cervical spine: Normal marrow signal. Sinuses/Orbits: Mild mucosal thickening throughout the paranasal sinuses. The orbits are maintained. Other: Mild left mastoid effusion IMPRESSION: 1. No acute intracranial abnormality. 2. Mild chronic microangiopathic changes and moderate parenchymal volume loss. 3. Mild left mastoid effusion. Electronically Signed   By: Pedro Earls M.D.   On: 11/24/2020 14:55       Assessment & Plan:   Problem List Items Addressed This Visit     Aortic stenosis - Primary    Noted on previous echo.  Persistent systolic murmur.  Saw cardiology.  Had recommended echo and stress myoview.  Discussed f/u with cardiology.  He declines.  Denies any chest pain, sob or chest tightness.  Declines any f/u or further testing at this time.         Relevant Orders   Lipid panel (Completed)   BPH with elevated PSA    On rapaflo.  Symptoms improved.   No pain.  Follow.        Elevated PSA    Has declined further work-up and evaluation.       Hyponatremia    Sodium decreased on recent lab at urgent care.  Recheck today to confirm wnl.        Relevant Orders   Hepatic function panel (Completed)   Basic metabolic panel (Completed)   Leukocytosis    White count slightly elevated on recent labs at urgent care.  Recheck CBC today to confirm within normal limits.       Relevant Orders   CBC with Differential/Platelet (Completed)     Einar Pheasant, MD

## 2021-03-10 ENCOUNTER — Telehealth: Payer: Self-pay

## 2021-03-10 NOTE — Telephone Encounter (Signed)
Called home phone and was sent to voicemail. Voicemail was full and could not accept any messages.

## 2021-03-13 ENCOUNTER — Encounter: Payer: Self-pay | Admitting: Internal Medicine

## 2021-03-13 NOTE — Assessment & Plan Note (Signed)
White count slightly elevated on recent labs at urgent care.  Recheck CBC today to confirm within normal limits.

## 2021-03-13 NOTE — Assessment & Plan Note (Signed)
Sodium decreased on recent lab at urgent care.  Recheck today to confirm wnl.

## 2021-03-13 NOTE — Assessment & Plan Note (Signed)
Noted on previous echo.  Persistent systolic murmur.  Saw cardiology.  Had recommended echo and stress myoview.  Discussed f/u with cardiology.  He declines.  Denies any chest pain, sob or chest tightness.  Declines any f/u or further testing at this time.

## 2021-03-13 NOTE — Assessment & Plan Note (Signed)
Has declined further work-up and evaluation.

## 2021-03-13 NOTE — Assessment & Plan Note (Signed)
On rapaflo.  Symptoms improved.  No pain.  Follow.

## 2021-03-16 DIAGNOSIS — H25811 Combined forms of age-related cataract, right eye: Secondary | ICD-10-CM | POA: Diagnosis not present

## 2021-03-16 DIAGNOSIS — H2589 Other age-related cataract: Secondary | ICD-10-CM | POA: Diagnosis not present

## 2021-03-16 DIAGNOSIS — H2511 Age-related nuclear cataract, right eye: Secondary | ICD-10-CM | POA: Diagnosis not present

## 2021-03-24 DIAGNOSIS — H2511 Age-related nuclear cataract, right eye: Secondary | ICD-10-CM | POA: Diagnosis not present

## 2021-03-29 DIAGNOSIS — H2589 Other age-related cataract: Secondary | ICD-10-CM | POA: Diagnosis not present

## 2021-03-29 DIAGNOSIS — H40013 Open angle with borderline findings, low risk, bilateral: Secondary | ICD-10-CM | POA: Diagnosis not present

## 2021-03-29 DIAGNOSIS — H2512 Age-related nuclear cataract, left eye: Secondary | ICD-10-CM | POA: Diagnosis not present

## 2021-03-29 DIAGNOSIS — H25012 Cortical age-related cataract, left eye: Secondary | ICD-10-CM | POA: Diagnosis not present

## 2021-04-29 ENCOUNTER — Encounter (INDEPENDENT_AMBULATORY_CARE_PROVIDER_SITE_OTHER): Payer: Medicare HMO | Admitting: Ophthalmology

## 2021-04-29 ENCOUNTER — Other Ambulatory Visit: Payer: Self-pay

## 2021-04-29 DIAGNOSIS — H43813 Vitreous degeneration, bilateral: Secondary | ICD-10-CM | POA: Diagnosis not present

## 2021-04-29 DIAGNOSIS — H31002 Unspecified chorioretinal scars, left eye: Secondary | ICD-10-CM | POA: Diagnosis not present

## 2021-04-29 DIAGNOSIS — H59031 Cystoid macular edema following cataract surgery, right eye: Secondary | ICD-10-CM

## 2021-05-11 DIAGNOSIS — H25812 Combined forms of age-related cataract, left eye: Secondary | ICD-10-CM | POA: Diagnosis not present

## 2021-05-11 DIAGNOSIS — H5703 Miosis: Secondary | ICD-10-CM | POA: Diagnosis not present

## 2021-05-11 DIAGNOSIS — H21562 Pupillary abnormality, left eye: Secondary | ICD-10-CM | POA: Diagnosis not present

## 2021-05-11 DIAGNOSIS — H2512 Age-related nuclear cataract, left eye: Secondary | ICD-10-CM | POA: Diagnosis not present

## 2021-05-19 DIAGNOSIS — H2512 Age-related nuclear cataract, left eye: Secondary | ICD-10-CM | POA: Diagnosis not present

## 2021-06-09 ENCOUNTER — Other Ambulatory Visit: Payer: Self-pay | Admitting: Internal Medicine

## 2021-07-12 ENCOUNTER — Other Ambulatory Visit: Payer: Self-pay

## 2021-07-12 ENCOUNTER — Ambulatory Visit (INDEPENDENT_AMBULATORY_CARE_PROVIDER_SITE_OTHER): Payer: Medicare HMO | Admitting: Internal Medicine

## 2021-07-12 VITALS — BP 112/68 | HR 79 | Temp 97.9°F | Resp 16 | Ht 66.0 in | Wt 145.4 lb

## 2021-07-12 DIAGNOSIS — R972 Elevated prostate specific antigen [PSA]: Secondary | ICD-10-CM

## 2021-07-12 DIAGNOSIS — R42 Dizziness and giddiness: Secondary | ICD-10-CM

## 2021-07-12 DIAGNOSIS — N400 Enlarged prostate without lower urinary tract symptoms: Secondary | ICD-10-CM

## 2021-07-12 DIAGNOSIS — I350 Nonrheumatic aortic (valve) stenosis: Secondary | ICD-10-CM

## 2021-07-12 NOTE — Progress Notes (Signed)
Patient ID: Marvin Farley, male   DOB: 05-03-1932, 87 y.o.   MRN: [id number]   Subjective:    Patient ID: Marvin Farley, male    DOB: [birth year], 87 y.o.   MRN: [id number]  This visit occurred during the SARS-CoV-2 public health emergency.  Safety protocols were in place, including screening questions prior to the visit, additional usage of staff PPE, and extensive cleaning of exam room while observing appropriate contact time as indicated for disinfecting solutions.   Patient here for a scheduled follow up.     HPI Here to follow up regarding his aortic stenosis.  Reports has noticed over the last week - dizziness/light headedness.  Only notices after he has been bending over for a while and then stands up.  May last 30 seconds and then resolves.  No chest pain, syncope or near syncope.  No sob.  Stays active.  Eating.  No nausea or vomiting.  Bowels moving.  No significant dizziness today.     Past Medical History:  Diagnosis Date   BPH (benign prostatic hypertrophy)    Kidney stones    Left wrist fracture 1984   Osteoporosis    Right wrist fracture 1996   Past Surgical History:  Procedure Laterality Date   HERNIA REPAIR Bilateral    inguinal   TONSILLECTOMY     Family History  Problem Relation Age of Onset   Cancer Mother        leukemia   Heart disease Father        heart attack   Cancer Brother        Lymphoma   Social History   Socioeconomic History   Marital status: Married    Spouse name: Not on file   Number of children: Not on file   Years of education: Not on file   Highest education level: Not on file  Occupational History   Not on file  Tobacco Use   Smoking status: Never   Smokeless tobacco: Never  Vaping Use   Vaping Use: Never used  Substance and Sexual Activity   Alcohol use: No    Alcohol/week: 0.0 standard drinks   Drug use: No   Sexual activity: Not on file  Other Topics Concern   Not on file  Social History Narrative   Not on file    Social Determinants of Health   Financial Resource Strain: Low Risk    Difficulty of Paying Living Expenses: Not hard at all  Food Insecurity: No Food Insecurity   Worried About Charity fundraiser in the Last Year: Never true   [place] in the Last Year: Never true  Transportation Needs: No Transportation Needs   Lack of Transportation (Medical): No   Lack of Transportation (Non-Medical): No  Physical Activity: Not on file  Stress: No Stress Concern Present   Feeling of Stress : Not at all  Social Connections: Unknown   Frequency of Communication with Friends and Family: Not on file   Frequency of Social Gatherings with Friends and Family: Not on file   Attends Religious Services: Not on file   Active Member of Clubs or Organizations: Not on file   Attends Archivist Meetings: Not on file   Marital Status: Married     Review of Systems  Constitutional:  Negative for appetite change and unexpected weight change.  HENT:  Negative for congestion and sinus pressure.   Respiratory:  Negative for cough, chest tightness and shortness  of breath.   Cardiovascular:  Negative for chest pain and palpitations.  Gastrointestinal:  Negative for abdominal pain, diarrhea, nausea and vomiting.  Genitourinary:  Negative for difficulty urinating and dysuria.  Musculoskeletal:  Negative for joint swelling and myalgias.  Skin:  Negative for color change and rash.  Neurological:  Positive for dizziness and light-headedness. Negative for headaches.  Psychiatric/Behavioral:  Negative for agitation and dysphoric mood.       Objective:     BP 112/68   Pulse 79   Temp 97.9 F (36.6 C)   Resp 16   Ht 5\' 6"  (1.676 m)   Wt 145 lb 6.4 oz (66 kg)   SpO2 98%   BMI 23.47 kg/m  Wt Readings from Last 3 Encounters:  07/12/21 145 lb 6.4 oz (66 kg)  03/09/21 142 lb 12.8 oz (64.8 kg)  11/02/20 143 lb 6.4 oz (65 kg)    Physical Exam Constitutional:      General: He is not in  acute distress.    Appearance: Normal appearance. He is well-developed.  HENT:     Head: Normocephalic and atraumatic.     Right Ear: External ear normal.     Left Ear: External ear normal.  Eyes:     General: No scleral icterus.       Right eye: No discharge.        Left eye: No discharge.  Cardiovascular:     Rate and Rhythm: Normal rate and regular rhythm.  Pulmonary:     Effort: Pulmonary effort is normal. No respiratory distress.     Breath sounds: Normal breath sounds.  Abdominal:     General: Bowel sounds are normal.     Palpations: Abdomen is soft.     Tenderness: There is no abdominal tenderness.  Musculoskeletal:        General: No swelling or tenderness.     Cervical back: Neck supple. No tenderness.  Lymphadenopathy:     Cervical: No cervical adenopathy.  Skin:    Findings: No erythema or rash.  Neurological:     Mental Status: He is alert.  Psychiatric:        Mood and Affect: Mood normal.        Behavior: Behavior normal.     Outpatient Encounter Medications as of 07/12/2021  Medication Sig   Ascorbic Acid (VITAMIN C) 500 MG CAPS Take by mouth 2 (two) times daily.   cholecalciferol (VITAMIN D3) 25 MCG (1000 UT) tablet Take 1,000 Units by mouth daily.   Multiple Vitamin (MULTIVITAMIN) capsule Take 1 capsule by mouth daily.   silodosin (RAPAFLO) 4 MG CAPS capsule TAKE 1 CAPSULE BY MOUTH ONCE DAILY WITH BREAKFAST   No facility-administered encounter medications on file as of 07/12/2021.     Lab Results  Component Value Date   WBC 4.7 07/12/2021   HGB 13.6 07/12/2021   HCT 40.6 07/12/2021   PLT 198.0 07/12/2021   GLUCOSE 90 07/12/2021   CHOL 168 03/09/2021   TRIG 72.0 03/09/2021   HDL 61.00 03/09/2021   LDLCALC 92 03/09/2021   ALT 6 07/12/2021   AST 21 07/12/2021   NA 137 07/12/2021   K 3.9 07/12/2021   CL 103 07/12/2021   CREATININE 1.02 07/12/2021   BUN 23 07/12/2021   CO2 24 07/12/2021   TSH 1.25 07/12/2021   PSA 6.18 (H) 12/26/2019    MR  Brain Wo Contrast  Result Date: 11/24/2020 CLINICAL DATA:  Aortic valve stenosis. Vision changes. Non intractable headache.  EXAM: MRI HEAD WITHOUT CONTRAST TECHNIQUE: Multiplanar, multiecho pulse sequences of the brain and surrounding structures were obtained without intravenous contrast. COMPARISON:  Head CT June 15, 2020 FINDINGS: Brain: No acute infarction, hemorrhage, hydrocephalus, extra-axial collection or mass lesion. A few scattered foci of T2 hyperintensity within the white matter of the cerebral hemispheres, nonspecific, most likely related to mild chronic microangiopathic changes. Moderate parenchymal volume loss, predominantly central. Vascular: Normal flow voids. Skull and upper cervical spine: Normal marrow signal. Sinuses/Orbits: Mild mucosal thickening throughout the paranasal sinuses. The orbits are maintained. Other: Mild left mastoid effusion IMPRESSION: 1. No acute intracranial abnormality. 2. Mild chronic microangiopathic changes and moderate parenchymal volume loss. 3. Mild left mastoid effusion. Electronically Signed   By: Pedro Earls M.D.   On: 11/24/2020 14:55       Assessment & Plan:   Problem List Items Addressed This Visit     Aortic stenosis - Primary    Noted on echo previously.  Systolic murmur - persistent.  Saw cardiology.  Had recommended echo and stress myoview.  He has not scheduled.  Discussed today.  Discussed the need to get these scheduled.  He is agreeable now.  Arrange with cardiology further w/up and evalaution.        BPH with elevated PSA    On rapaflo.  Symptoms improved.  No pain.  Follow.       Dizziness    Dizziness/light headedness as outlined.  Notices after bending over - coming up.  Not orthostatic.  Discussed slow position changes and movements.  Discussed importance of eating and staying hydrated.  Has known AS.  Needs further evaluation of aortic valve.  Agreeable to referral back as outlined.  Will need f/u echo and  lexiscan as previously recommended.  Also see if can arrange carotid ultrasound.       Relevant Orders   CBC with Differential/Platelet (Completed)   TSH (Completed)   Hepatic function panel (Completed)   Basic metabolic panel (Completed)     Einar Pheasant, MD

## 2021-07-13 LAB — HEPATIC FUNCTION PANEL
ALT: 6 U/L (ref 0–53)
AST: 21 U/L (ref 0–37)
Albumin: 4 g/dL (ref 3.5–5.2)
Alkaline Phosphatase: 62 U/L (ref 39–117)
Bilirubin, Direct: 0.1 mg/dL (ref 0.0–0.3)
Total Bilirubin: 0.4 mg/dL (ref 0.2–1.2)
Total Protein: 6.4 g/dL (ref 6.0–8.3)

## 2021-07-13 LAB — TSH: TSH: 1.25 u[IU]/mL (ref 0.35–5.50)

## 2021-07-13 LAB — BASIC METABOLIC PANEL
BUN: 23 mg/dL (ref 6–23)
CO2: 24 mEq/L (ref 19–32)
Calcium: 8.9 mg/dL (ref 8.4–10.5)
Chloride: 103 mEq/L (ref 96–112)
Creatinine, Ser: 1.02 mg/dL (ref 0.40–1.50)
GFR: 65.96 mL/min (ref >60.00)
Glucose, Bld: 90 mg/dL (ref 70–99)
Potassium: 3.9 mEq/L (ref 3.5–5.1)
Sodium: 137 mEq/L (ref 135–145)

## 2021-07-13 LAB — CBC WITH DIFFERENTIAL/PLATELET
Basophils Absolute: 0.1 10*3/uL (ref 0.0–0.1)
Basophils Relative: 1.1 % (ref 0.0–3.0)
Eosinophils Absolute: 0.1 10*3/uL (ref 0.0–0.7)
Eosinophils Relative: 3.1 % (ref 0.0–5.0)
HCT: 40.6 % (ref 39.0–52.0)
Hemoglobin: 13.6 g/dL (ref 13.0–17.0)
Lymphocytes Relative: 32.8 % (ref 12.0–46.0)
Lymphs Abs: 1.5 10*3/uL (ref 0.7–4.0)
MCHC: 33.4 g/dL (ref 30.0–36.0)
MCV: 90 fl (ref 78.0–100.0)
Monocytes Absolute: 0.4 10*3/uL (ref 0.1–1.0)
Monocytes Relative: 8.2 % (ref 3.0–12.0)
Neutro Abs: 2.6 10*3/uL (ref 1.4–7.7)
Neutrophils Relative %: 54.8 % (ref 43.0–77.0)
Platelets: 198 10*3/uL (ref 150.0–400.0)
RBC: 4.51 Mil/uL (ref 4.22–5.81)
RDW: 14.7 % (ref 11.5–15.5)
WBC: 4.7 10*3/uL (ref 4.0–10.5)

## 2021-07-18 ENCOUNTER — Encounter: Payer: Self-pay | Admitting: Internal Medicine

## 2021-07-18 NOTE — Assessment & Plan Note (Signed)
On rapaflo.  Symptoms improved.  No pain.  Follow.

## 2021-07-18 NOTE — Assessment & Plan Note (Signed)
Noted on echo previously.  Systolic murmur - persistent.  Saw cardiology.  Had recommended echo and stress myoview.  He has not scheduled.  Discussed today.  Discussed the need to get these scheduled.  He is agreeable now.  Arrange with cardiology further w/up and evalaution.

## 2021-07-18 NOTE — Assessment & Plan Note (Signed)
Dizziness/light headedness as outlined.  Notices after bending over - coming up.  Not orthostatic.  Discussed slow position changes and movements.  Discussed importance of eating and staying hydrated.  Has known AS.  Needs further evaluation of aortic valve.  Agreeable to referral back as outlined.  Will need f/u echo and lexiscan as previously recommended.  Also see if can arrange carotid ultrasound.

## 2021-08-05 ENCOUNTER — Telehealth: Payer: Self-pay | Admitting: Internal Medicine

## 2021-08-05 NOTE — Telephone Encounter (Signed)
Notify Marvin Farley that when I saw him, we had talked about a f/u appt with cardiology for further testing (especially given his intermittent episodes of dizziness, etc).  Cardiology can see him Monday 08/09/21 at 8:00.  [facility] cardiology - where he went previously).  If he cannot make this appt, please have him call and change.  Any questions, let me know.

## 2021-08-05 NOTE — Telephone Encounter (Signed)
Left message to call office

## 2021-08-06 NOTE — Telephone Encounter (Signed)
Pt called in regards to previous message. Pt was advised of message and confirmed he can make the appt to [facility] cardiology Dec 5 at [facility]

## 2021-08-06 NOTE — Telephone Encounter (Signed)
Pts wife called in regards to previous message. Pt was advised of message. Pls call pts wife because pt is at work. [phone]

## 2021-08-09 DIAGNOSIS — H52203 Unspecified astigmatism, bilateral: Secondary | ICD-10-CM | POA: Diagnosis not present

## 2021-08-09 DIAGNOSIS — R42 Dizziness and giddiness: Secondary | ICD-10-CM | POA: Diagnosis not present

## 2021-08-09 DIAGNOSIS — I350 Nonrheumatic aortic (valve) stenosis: Secondary | ICD-10-CM | POA: Diagnosis not present

## 2021-08-13 DIAGNOSIS — I350 Nonrheumatic aortic (valve) stenosis: Secondary | ICD-10-CM | POA: Diagnosis not present

## 2021-08-23 ENCOUNTER — Other Ambulatory Visit: Payer: Self-pay

## 2021-08-23 ENCOUNTER — Encounter (INDEPENDENT_AMBULATORY_CARE_PROVIDER_SITE_OTHER): Payer: Medicare HMO | Admitting: Ophthalmology

## 2021-08-23 DIAGNOSIS — H43813 Vitreous degeneration, bilateral: Secondary | ICD-10-CM | POA: Diagnosis not present

## 2021-08-23 DIAGNOSIS — H31002 Unspecified chorioretinal scars, left eye: Secondary | ICD-10-CM | POA: Diagnosis not present

## 2021-08-23 DIAGNOSIS — H59031 Cystoid macular edema following cataract surgery, right eye: Secondary | ICD-10-CM | POA: Diagnosis not present

## 2021-08-24 ENCOUNTER — Telehealth: Payer: Self-pay | Admitting: Internal Medicine

## 2021-08-24 DIAGNOSIS — I350 Nonrheumatic aortic (valve) stenosis: Secondary | ICD-10-CM | POA: Diagnosis not present

## 2021-08-24 DIAGNOSIS — R42 Dizziness and giddiness: Secondary | ICD-10-CM | POA: Diagnosis not present

## 2021-08-24 DIAGNOSIS — I499 Cardiac arrhythmia, unspecified: Secondary | ICD-10-CM | POA: Diagnosis not present

## 2021-08-24 DIAGNOSIS — H52203 Unspecified astigmatism, bilateral: Secondary | ICD-10-CM | POA: Diagnosis not present

## 2021-08-24 NOTE — Telephone Encounter (Signed)
Pt wife called in stating that she had provide insurance form to the office. Pt wife was wondering if insurance form was completed. Pt stated that she needs form before the end of the year. Pt stated that the insurance people are harassing her about the form. Pt state is requesting callback.

## 2021-08-25 NOTE — Telephone Encounter (Signed)
Form placed in box at your desk.  I have completed my part.  He will need cardiology and ophthalmology to complete.  See note on form.

## 2021-08-25 NOTE — Telephone Encounter (Signed)
Spoke with patient regarding his form. We were holding form because he was undergoing cardiac work up. He had f/u with them yesterday. Patient is not having any acute symptoms at this time. Do we need to send form to them so they can clear him or just have them give [place] the ok to complete form?

## 2021-08-26 NOTE — Telephone Encounter (Signed)
Spoke with wife. Advised that form is completed for the most part but cardiology and ophthalmology will have to finish completing. She is going to call patient and see if he can pick up today. If not, she will pick up tomorrow. Form is at my desk.

## 2021-08-27 ENCOUNTER — Ambulatory Visit (INDEPENDENT_AMBULATORY_CARE_PROVIDER_SITE_OTHER): Payer: Medicare HMO

## 2021-08-27 VITALS — Ht 66.0 in | Wt 145.0 lb

## 2021-08-27 DIAGNOSIS — Z0000 Encounter for general adult medical examination without abnormal findings: Secondary | ICD-10-CM | POA: Diagnosis not present

## 2021-08-27 NOTE — Patient Instructions (Addendum)
[name] , Thank you for taking time to come for your Medicare Wellness Visit. I appreciate your ongoing commitment to your health goals. Please review the following plan we discussed and let me know if I can assist you in the future.   These are the goals we discussed:  Goals       Patient Stated     Maintain weight (pt-stated)      Other     DIET - INCREASE WATER INTAKE      Stay hydrated        This is a list of the screening recommended for you and due dates:  Health Maintenance  Topic Date Due   COVID-19 Vaccine (3 - Booster for Moderna series) 09/12/2021*   Zoster (Shingles) Vaccine (1 of 2) 10/12/2021*   Flu Shot  12/03/2021*   Tetanus Vaccine  08/27/2022*   Pneumonia Vaccine  Completed   HPV Vaccine  Aged Out  *Topic was postponed. The date shown is not the original due date.   Advanced directives: not yet completed  Conditions/risks identified: none new  Follow up in one year for your annual wellness visit.   Preventive Care 1 Years and Older, Male Preventive care refers to lifestyle choices and visits with your health care provider that can promote health and wellness. What does preventive care include? A yearly physical exam. This is also called an annual well check. Dental exams once or twice a year. Routine eye exams. Ask your health care provider how often you should have your eyes checked. Personal lifestyle choices, including: Daily care of your teeth and gums. Regular physical activity. Eating a healthy diet. Avoiding tobacco and drug use. Limiting alcohol use. Practicing safe sex. Taking low doses of aspirin every day. Taking vitamin and mineral supplements as recommended by your health care provider. What happens during an annual well check? The services and screenings done by your health care provider during your annual well check will depend on your age, overall health, lifestyle risk factors, and family history of disease. Counseling  Your  health care provider may ask you questions about your: Alcohol use. Tobacco use. Drug use. Emotional well-being. Home and relationship well-being. Sexual activity. Eating habits. History of falls. Memory and ability to understand (cognition). Work and work Statistician. Screening  You may have the following tests or measurements: Height, weight, and BMI. Blood pressure. Lipid and cholesterol levels. These may be checked every 5 years, or more frequently if you are over 63 years old. Skin check. Lung cancer screening. You may have this screening every year starting at age 61 if you have a 30-pack-year history of smoking and currently smoke or have quit within the past 15 years. Fecal occult blood test (FOBT) of the stool. You may have this test every year starting at age 57. Flexible sigmoidoscopy or colonoscopy. You may have a sigmoidoscopy every 5 years or a colonoscopy every 10 years starting at age 33. Prostate cancer screening. Recommendations will vary depending on your family history and other risks. Hepatitis C blood test. Hepatitis B blood test. Sexually transmitted disease (STD) testing. Diabetes screening. This is done by checking your blood sugar (glucose) after you have not eaten for a while (fasting). You may have this done every 1-3 years. Abdominal aortic aneurysm (AAA) screening. You may need this if you are a current or former smoker. Osteoporosis. You may be screened starting at age 21 if you are at high risk. Talk with your health care provider about  your test results, treatment options, and if necessary, the need for more tests. Vaccines  Your health care provider may recommend certain vaccines, such as: Influenza vaccine. This is recommended every year. Tetanus, diphtheria, and acellular pertussis (Tdap, Td) vaccine. You may need a Td booster every 10 years. Zoster vaccine. You may need this after age 15. Pneumococcal 13-valent conjugate (PCV13) vaccine. One dose  is recommended after age 54. Pneumococcal polysaccharide (PPSV23) vaccine. One dose is recommended after age 57. Talk to your health care provider about which screenings and vaccines you need and how often you need them. This information is not intended to replace advice given to you by your health care provider. Make sure you discuss any questions you have with your health care provider. Document Released: 09/18/2015 Document Revised: 05/11/2016 Document Reviewed: 06/23/2015 Elsevier Interactive Patient Education  2017 [place] Prevention in the Home Falls can cause injuries. They can happen to people of all ages. There are many things you can do to make your home safe and to help prevent falls. What can I do on the outside of my home? Regularly fix the edges of walkways and driveways and fix any cracks. Remove anything that might make you trip as you walk through a door, such as a raised step or threshold. Trim any bushes or trees on the path to your home. Use bright outdoor lighting. Clear any walking paths of anything that might make someone trip, such as rocks or tools. Regularly check to see if handrails are loose or broken. Make sure that both sides of any steps have handrails. Any raised decks and porches should have guardrails on the edges. Have any leaves, snow, or ice cleared regularly. Use sand or salt on walking paths during winter. Clean up any spills in your garage right away. This includes oil or grease spills. What can I do in the bathroom? Use night lights. Install grab bars by the toilet and in the tub and shower. Do not use towel bars as grab bars. Use non-skid mats or decals in the tub or shower. If you need to sit down in the shower, use a plastic, non-slip stool. Keep the floor dry. Clean up any water that spills on the floor as soon as it happens. Remove soap buildup in the tub or shower regularly. Attach bath mats securely with double-sided non-slip rug  tape. Do not have throw rugs and other things on the floor that can make you trip. What can I do in the bedroom? Use night lights. Make sure that you have a light by your bed that is easy to reach. Do not use any sheets or blankets that are too big for your bed. They should not hang down onto the floor. Have a firm chair that has side arms. You can use this for support while you get dressed. Do not have throw rugs and other things on the floor that can make you trip. What can I do in the kitchen? Clean up any spills right away. Avoid walking on wet floors. Keep items that you use a lot in easy-to-reach places. If you need to reach something above you, use a strong step stool that has a grab bar. Keep electrical cords out of the way. Do not use floor polish or wax that makes floors slippery. If you must use wax, use non-skid floor wax. Do not have throw rugs and other things on the floor that can make you trip. What can I do with  my stairs? Do not leave any items on the stairs. Make sure that there are handrails on both sides of the stairs and use them. Fix handrails that are broken or loose. Make sure that handrails are as long as the stairways. Check any carpeting to make sure that it is firmly attached to the stairs. Fix any carpet that is loose or worn. Avoid having throw rugs at the top or bottom of the stairs. If you do have throw rugs, attach them to the floor with carpet tape. Make sure that you have a light switch at the top of the stairs and the bottom of the stairs. If you do not have them, ask someone to add them for you. What else can I do to help prevent falls? Wear shoes that: Do not have high heels. Have rubber bottoms. Are comfortable and fit you well. Are closed at the toe. Do not wear sandals. If you use a stepladder: Make sure that it is fully opened. Do not climb a closed stepladder. Make sure that both sides of the stepladder are locked into place. Ask someone to  hold it for you, if possible. Clearly mark and make sure that you can see: Any grab bars or handrails. First and last steps. Where the edge of each step is. Use tools that help you move around (mobility aids) if they are needed. These include: Canes. Walkers. Scooters. Crutches. Turn on the lights when you go into a dark area. Replace any light bulbs as soon as they burn out. Set up your furniture so you have a clear path. Avoid moving your furniture around. If any of your floors are uneven, fix them. If there are any pets around you, be aware of where they are. Review your medicines with your doctor. Some medicines can make you feel dizzy. This can increase your chance of falling. Ask your doctor what other things that you can do to help prevent falls. This information is not intended to replace advice given to you by your health care provider. Make sure you discuss any questions you have with your health care provider. Document Released: 06/18/2009 Document Revised: 01/28/2016 Document Reviewed: 09/26/2014 Elsevier Interactive Patient Education  2017 [employer].

## 2021-08-27 NOTE — Progress Notes (Signed)
Subjective:   Marvin Farley is a 87 y.o. male who presents for Medicare Annual/Subsequent preventive examination.  Review of Systems    No ROS.  Medicare Wellness Virtual Visit.  Visual/audio telehealth visit, UTA vital signs.   See social history for additional risk factors.   Cardiac Risk Factors include: advanced age (>49men, >64 women);male gender     Objective:    Today's Vitals   08/27/21 0941  Weight: 145 lb (65.8 kg)  Height: 5\' 6"  (1.676 m)   Body mass index is 23.4 kg/m.  Advanced Directives 08/27/2021 08/26/2020 06/15/2020 08/26/2019 08/21/2018  Does Patient Have a Medical Advance Directive? No No No No No  Does patient want to make changes to medical advance directive? - - - - No - Patient declined  Would patient like information on creating a medical advance directive? No - Patient declined No - Patient declined - No - Patient declined -    Current Medications (verified) Outpatient Encounter Medications as of 08/27/2021  Medication Sig   Ascorbic Acid (VITAMIN C) 500 MG CAPS Take by mouth 2 (two) times daily.   cholecalciferol (VITAMIN D3) 25 MCG (1000 UT) tablet Take 1,000 Units by mouth daily.   Multiple Vitamin (MULTIVITAMIN) capsule Take 1 capsule by mouth daily.   silodosin (RAPAFLO) 4 MG CAPS capsule TAKE 1 CAPSULE BY MOUTH ONCE DAILY WITH BREAKFAST   No facility-administered encounter medications on file as of 08/27/2021.    Allergies (verified) Codeine sulfate   History: Past Medical History:  Diagnosis Date   BPH (benign prostatic hypertrophy)    Kidney stones    Left wrist fracture 1984   Osteoporosis    Right wrist fracture 1996   Past Surgical History:  Procedure Laterality Date   HERNIA REPAIR Bilateral    inguinal   TONSILLECTOMY     Family History  Problem Relation Age of Onset   Cancer Mother        leukemia   Heart disease Father        heart attack   Cancer Brother        Lymphoma   Social History   Socioeconomic  History   Marital status: Married    Spouse name: Not on file   Number of children: Not on file   Years of education: Not on file   Highest education level: Not on file  Occupational History   Not on file  Tobacco Use   Smoking status: Never   Smokeless tobacco: Never  Vaping Use   Vaping Use: Never used  Substance and Sexual Activity   Alcohol use: No    Alcohol/week: 0.0 standard drinks   Drug use: No   Sexual activity: Not on file  Other Topics Concern   Not on file  Social History Narrative   Not on file   Social Determinants of Health   Financial Resource Strain: Low Risk    Difficulty of Paying Living Expenses: Not hard at all  Food Insecurity: No Food Insecurity   Worried About Charity fundraiser in the Last Year: Never true   [place] in the Last Year: Never true  Transportation Needs: No Transportation Needs   Lack of Transportation (Medical): No   Lack of Transportation (Non-Medical): No  Physical Activity: Insufficiently Active   Days of Exercise per Week: 4 days   Minutes of Exercise per Session: 30 min  Stress: No Stress Concern Present   Feeling of Stress : Not at all  Social Connections: Unknown   Frequency of Communication with Friends and Family: Not on file   Frequency of Social Gatherings with Friends and Family: Not on file   Attends Religious Services: Not on Electrical engineer or Organizations: Not on file   Attends Archivist Meetings: Not on file   Marital Status: Married    Tobacco Counseling Counseling given: Not Answered   Clinical Intake:  Pre-visit preparation completed: Yes        Diabetes: No  How often do you need to have someone help you when you read instructions, pamphlets, or other written materials from your doctor or pharmacy?: 1 - Never    Interpreter Needed?: No      Activities of Daily Living In your present state of health, do you have any difficulty performing the following  activities: 08/27/2021  Hearing? N  Vision? N  Difficulty concentrating or making decisions? N  Walking or climbing stairs? N  Dressing or bathing? N  Doing errands, shopping? N  Preparing Food and eating ? N  Using the Toilet? N  In the past six months, have you accidently leaked urine? N  Do you have problems with loss of bowel control? N  Managing your Medications? N  Managing your Finances? Y  Comment Wife manages  Housekeeping or managing your Housekeeping? N  Some recent data might be hidden    Patient Care Team: Einar Pheasant, MD as PCP - General (Internal Medicine)  Indicate any recent Medical Services you may have received from other than [facility] providers in the past year (date may be approximate).     Assessment:   This is a routine wellness examination for Marvin Farley.  Virtual Visit via Telephone Note  I connected with  [name] on 08/27/21 at 10:30 AM EST by telephone and verified that I am speaking with the correct person using two identifiers.  Persons participating in the virtual visit: patient/Nurse Health Advisor   I discussed the limitations, risks, security and privacy concerns of performing an evaluation and management service by telephone and the availability of in person appointments. The patient expressed understanding and agreed to proceed.  Interactive audio and video telecommunications were attempted between this nurse and patient, however failed, due to patient having technical difficulties OR patient did not have access to video capability.  We continued and completed visit with audio only.  Some vital signs may be absent or patient reported.   Hearing/Vision screen Hearing Screening - Comments:: Patient is able to hear conversational tones without difficulty. No issues reported. Vision Screening - Comments:: Followed by [facility]  Wears corrective lenses    Dietary issues and exercise activities discussed: Current Exercise  Habits: Home exercise routine, Type of exercise: walking, Intensity: Mild Regular diet Good water intake   Goals Addressed               This Visit's Progress     Patient Stated     Maintain weight (pt-stated)        Other     DIET - INCREASE WATER INTAKE        Stay hydrated       Depression Screen PHQ 2/9 Scores 08/27/2021 08/26/2020 08/26/2019 08/05/2019 08/21/2018 10/13/2016 04/07/2016  PHQ - 2 Score 0 0 0 0 0 0 0    Fall Risk Fall Risk  08/27/2021 11/02/2020 08/26/2019 08/22/2019 08/05/2019  Falls in the past year? 0 0 0 0 0  Number falls  in past yr: 0 - - - 0  Injury with Fall? 0 - - - -  Comment - - - - -  Risk for fall due to : - - - - -  Follow up - - Falls prevention discussed Falls evaluation completed Falls evaluation completed    [place]: Home free of loose throw rugs in walkways, pet beds, electrical cords, etc? Yes  Adequate lighting in your home to reduce risk of falls? Yes   ASSISTIVE DEVICES UTILIZED TO PREVENT FALLS: Life alert? No  Use of a cane, walker or w/c? No   TIMED UP AND GO: Was the test performed? No .   Cognitive Function:  Patient is alert and oriented x3.    6CIT Screen 08/26/2020 08/26/2019 08/21/2018  What Year? 0 points 0 points 0 points  What month? 0 points 0 points 0 points  What time? 0 points 0 points 0 points  Count back from 20 0 points 0 points 0 points  Months in reverse 0 points 0 points 0 points  Repeat phrase 0 points 0 points 0 points  Total Score 0 0 0    Immunizations Immunization History  Administered Date(s) Administered   Moderna Sars-Covid-2 Vaccination 10/30/2019, 11/27/2019   Pneumococcal Conjugate-13 04/03/2014   Pneumococcal Polysaccharide-23 10/14/2016   Tdap 11/17/2010   TDAP status: Due, Education has been provided regarding the importance of this vaccine. Advised may receive this vaccine at local pharmacy or Health Dept. Aware to provide a copy of the  vaccination record if obtained from local pharmacy or Health Dept. Verbalized acceptance and understanding. Deferred.   Screening Tests Health Maintenance  Topic Date Due   COVID-19 Vaccine (3 - Booster for Moderna series) 09/12/2021 (Originally 01/22/2020)   Zoster Vaccines- Shingrix (1 of 2) 10/12/2021 (Originally 10/04/1983)   INFLUENZA VACCINE  12/03/2021 (Originally 04/05/2021)   TETANUS/TDAP  08/27/2022 (Originally 11/16/2020)   Pneumonia Vaccine 8+ Years old  Completed   HPV VACCINES  Aged Out   Health Maintenance There are no preventive care reminders to display for this patient.  Lung Cancer Screening: (Low Dose CT Chest recommended if Age 65-80 years, 30 pack-year currently smoking OR have quit w/in 15years.) does not qualify.   Hepatitis C Screening: does not qualify  Vision Screening: Recommended annual ophthalmology exams for early detection of glaucoma and other disorders of the eye.  Dental Screening: Recommended annual dental exams for proper oral hygiene.  Community Resource Referral / Chronic Care Management: CRR required this visit?  No   CCM required this visit?  No      Plan:   Keep all routine maintenance appointments.   I have personally reviewed and noted the following in the patients chart:   Medical and social history Use of alcohol, tobacco or illicit drugs  Current medications and supplements including opioid prescriptions. Patient is not currently taking opioid prescriptions. Functional ability and status Nutritional status Physical activity Advanced directives List of other physicians Hospitalizations, surgeries, and ER visits in previous 12 months Vitals Screenings to include cognitive, depression, and falls Referrals and appointments  In addition, I have reviewed and discussed with patient certain preventive protocols, quality metrics, and best practice recommendations. A written personalized care plan for preventive services as well as  general preventive health recommendations were provided to patient.     Varney Biles, LPN   [id number]/09/[id number]

## 2021-09-09 DIAGNOSIS — I499 Cardiac arrhythmia, unspecified: Secondary | ICD-10-CM | POA: Diagnosis not present

## 2021-09-27 ENCOUNTER — Other Ambulatory Visit: Payer: Self-pay

## 2021-09-27 ENCOUNTER — Telehealth: Payer: Self-pay | Admitting: Internal Medicine

## 2021-09-27 ENCOUNTER — Other Ambulatory Visit: Payer: Self-pay | Admitting: Internal Medicine

## 2021-09-27 MED ORDER — SILODOSIN 4 MG PO CAPS: 4.0000 mg | ORAL_CAPSULE | Freq: Every day | ORAL | 1 refills | Status: DC

## 2021-09-27 NOTE — Telephone Encounter (Signed)
Patient is requesting a refill on his silodosin (RAPAFLO) 4 MG CAPS capsule.

## 2021-09-27 NOTE — Telephone Encounter (Signed)
Medication refilled. Patient is aware

## 2021-10-15 ENCOUNTER — Other Ambulatory Visit: Payer: Self-pay

## 2021-10-15 ENCOUNTER — Ambulatory Visit (INDEPENDENT_AMBULATORY_CARE_PROVIDER_SITE_OTHER): Payer: Medicare HMO | Admitting: Internal Medicine

## 2021-10-15 DIAGNOSIS — N400 Enlarged prostate without lower urinary tract symptoms: Secondary | ICD-10-CM

## 2021-10-15 DIAGNOSIS — I350 Nonrheumatic aortic (valve) stenosis: Secondary | ICD-10-CM

## 2021-10-15 DIAGNOSIS — R972 Elevated prostate specific antigen [PSA]: Secondary | ICD-10-CM | POA: Diagnosis not present

## 2021-10-15 DIAGNOSIS — H539 Unspecified visual disturbance: Secondary | ICD-10-CM | POA: Diagnosis not present

## 2021-10-15 DIAGNOSIS — R1310 Dysphagia, unspecified: Secondary | ICD-10-CM

## 2021-10-15 DIAGNOSIS — Z13220 Encounter for screening for lipoid disorders: Secondary | ICD-10-CM | POA: Diagnosis not present

## 2021-10-15 MED ORDER — PANTOPRAZOLE SODIUM 40 MG PO TBEC: 40.0000 mg | DELAYED_RELEASE_TABLET | Freq: Every day | ORAL | 2 refills | Status: DC

## 2021-10-15 NOTE — Progress Notes (Signed)
Patient ID: Marvin Farley, male   DOB: 02-11-1932, 88 y.o.   MRN: [id number]   Subjective:    Patient ID: Marvin Farley, male    DOB: 1932/07/12, 88 y.o.   MRN: [id number]  This visit occurred during the SARS-CoV-2 public health emergency.  Safety protocols were in place, including screening questions prior to the visit, additional usage of staff PPE, and extensive cleaning of exam room while observing appropriate contact time as indicated for disinfecting solutions.   Patient here for a scheduled follow up .   HPI Here to follow up regarding dizziness and aortic stenosis.  Saw cardiology.  ECHO - moderate AS, mild to moderate MR.  Noticed irregular heart rhythm during echo.  Worse holter.  Waiting for results.  Denies dizziness now.  No headache.  No chest pain.  No sob reported.  Vision has improved - astigmatism. Seeing ophthalmology.  Is having problems with dysphagia.  Feels as if food is getting stuck when swallows.  Increased acid reflux.  Taking aloe.  No vomiting.  No abdominal pain.  Bowels moving.  Still working.     Past Medical History:  Diagnosis Date   BPH (benign prostatic hypertrophy)    Kidney stones    Left wrist fracture 1984   Osteoporosis    Right wrist fracture 1996   Past Surgical History:  Procedure Laterality Date   HERNIA REPAIR Bilateral    inguinal   TONSILLECTOMY     Family History  Problem Relation Age of Onset   Cancer Mother        leukemia   Heart disease Father        heart attack   Cancer Brother        Lymphoma   Social History   Socioeconomic History   Marital status: Married    Spouse name: Not on file   Number of children: Not on file   Years of education: Not on file   Highest education level: Not on file  Occupational History   Not on file  Tobacco Use   Smoking status: Never   Smokeless tobacco: Never  Vaping Use   Vaping Use: Never used  Substance and Sexual Activity   Alcohol use: No    Alcohol/week: 0.0 standard  drinks   Drug use: No   Sexual activity: Not on file  Other Topics Concern   Not on file  Social History Narrative   Not on file   Social Determinants of Health   Financial Resource Strain: Low Risk    Difficulty of Paying Living Expenses: Not hard at all  Food Insecurity: No Food Insecurity   Worried About Charity fundraiser in the Last Year: Never true   [place] in the Last Year: Never true  Transportation Needs: No Transportation Needs   Lack of Transportation (Medical): No   Lack of Transportation (Non-Medical): No  Physical Activity: Insufficiently Active   Days of Exercise per Week: 4 days   Minutes of Exercise per Session: 30 min  Stress: No Stress Concern Present   Feeling of Stress : Not at all  Social Connections: Unknown   Frequency of Communication with Friends and Family: Not on file   Frequency of Social Gatherings with Friends and Family: Not on file   Attends Religious Services: Not on file   Active Member of Clubs or Organizations: Not on file   Attends Archivist Meetings: Not on file   Marital Status: Married  Review of Systems  Constitutional:  Negative for appetite change and unexpected weight change.  HENT:  Negative for congestion and sinus pressure.   Respiratory:  Negative for cough, chest tightness and shortness of breath.   Cardiovascular:  Negative for chest pain, palpitations and leg swelling.  Gastrointestinal:  Negative for abdominal pain, diarrhea, nausea and vomiting.       Dysphagia.   Genitourinary:  Negative for difficulty urinating and dysuria.  Musculoskeletal:  Negative for joint swelling and myalgias.  Skin:  Negative for color change and rash.  Neurological:  Negative for dizziness, light-headedness and headaches.  Psychiatric/Behavioral:  Negative for agitation and dysphoric mood.       Objective:     BP 110/68    Pulse 80    Temp 97.9 F (36.6 C)    Resp 16    Ht 5\' 6"  (1.676 m)    Wt 148 lb 9.6 oz  (67.4 kg)    SpO2 98%    BMI 23.98 kg/m  Wt Readings from Last 3 Encounters:  10/15/21 148 lb 9.6 oz (67.4 kg)  08/27/21 145 lb (65.8 kg)  07/12/21 145 lb 6.4 oz (66 kg)    Physical Exam Constitutional:      General: He is not in acute distress.    Appearance: Normal appearance. He is well-developed.  HENT:     Head: Normocephalic and atraumatic.     Right Ear: External ear normal.     Left Ear: External ear normal.  Eyes:     General: No scleral icterus.       Right eye: No discharge.        Left eye: No discharge.  Cardiovascular:     Rate and Rhythm: Normal rate and regular rhythm.     Comments: 2/6 systolic murmur.  Pulmonary:     Effort: Pulmonary effort is normal. No respiratory distress.     Breath sounds: Normal breath sounds.  Abdominal:     General: Bowel sounds are normal.     Palpations: Abdomen is soft.     Tenderness: There is no abdominal tenderness.  Musculoskeletal:        General: No swelling or tenderness.     Cervical back: Neck supple. No tenderness.  Lymphadenopathy:     Cervical: No cervical adenopathy.  Skin:    Findings: No erythema or rash.  Neurological:     Mental Status: He is alert.  Psychiatric:        Mood and Affect: Mood normal.        Behavior: Behavior normal.     Outpatient Encounter Medications as of 10/15/2021  Medication Sig   pantoprazole (PROTONIX) 40 MG tablet Take 1 tablet (40 mg total) by mouth daily. Take 30 minutes before your evening meal.   Ascorbic Acid (VITAMIN C) 500 MG CAPS Take by mouth 2 (two) times daily.   cholecalciferol (VITAMIN D3) 25 MCG (1000 UT) tablet Take 1,000 Units by mouth daily.   Multiple Vitamin (MULTIVITAMIN) capsule Take 1 capsule by mouth daily.   silodosin (RAPAFLO) 4 MG CAPS capsule Take 1 capsule (4 mg total) by mouth daily with breakfast.   No facility-administered encounter medications on file as of 10/15/2021.     Lab Results  Component Value Date   WBC 4.7 07/12/2021   HGB 13.6  07/12/2021   HCT 40.6 07/12/2021   PLT 198.0 07/12/2021   GLUCOSE 90 07/12/2021   CHOL 168 03/09/2021   TRIG 72.0 03/09/2021   HDL  61.00 03/09/2021   LDLCALC 92 03/09/2021   ALT 6 07/12/2021   AST 21 07/12/2021   NA 137 07/12/2021   K 3.9 07/12/2021   CL 103 07/12/2021   CREATININE 1.02 07/12/2021   BUN 23 07/12/2021   CO2 24 07/12/2021   TSH 1.25 07/12/2021   PSA 6.18 (H) 12/26/2019    MR Brain Wo Contrast  Result Date: 11/24/2020 CLINICAL DATA:  Aortic valve stenosis. Vision changes. Non intractable headache. EXAM: MRI HEAD WITHOUT CONTRAST TECHNIQUE: Multiplanar, multiecho pulse sequences of the brain and surrounding structures were obtained without intravenous contrast. COMPARISON:  Head CT June 15, 2020 FINDINGS: Brain: No acute infarction, hemorrhage, hydrocephalus, extra-axial collection or mass lesion. A few scattered foci of T2 hyperintensity within the white matter of the cerebral hemispheres, nonspecific, most likely related to mild chronic microangiopathic changes. Moderate parenchymal volume loss, predominantly central. Vascular: Normal flow voids. Skull and upper cervical spine: Normal marrow signal. Sinuses/Orbits: Mild mucosal thickening throughout the paranasal sinuses. The orbits are maintained. Other: Mild left mastoid effusion IMPRESSION: 1. No acute intracranial abnormality. 2. Mild chronic microangiopathic changes and moderate parenchymal volume loss. 3. Mild left mastoid effusion. Electronically Signed   By: Pedro Earls M.D.   On: 11/24/2020 14:55       Assessment & Plan:   Problem List Items Addressed This Visit     Aortic stenosis    Just evaluated by cardiology.  ECHO as outlined.  No dizziness or light headedness now.  Follow.       Relevant Orders   Basic metabolic panel   BPH with elevated PSA    Continue rapaflo.       Dysphagia    Has noticed increased acid reflux and dysphagia.  Treat with protonix as directed.  Refer to GI  for evaluation and question of need for EGD.  Discussed importance of eating slowly, taking small bites and chewing food well.        Relevant Orders   Ambulatory referral to Gastroenterology   Vision changes    Seeing ophthalmology.  Astigmatism.  No vision change now.        Other Visit Diagnoses     Screening cholesterol level       Relevant Orders   Lipid panel   Hepatic function panel        Einar Pheasant, MD

## 2021-10-16 ENCOUNTER — Encounter: Payer: Self-pay | Admitting: Internal Medicine

## 2021-10-16 DIAGNOSIS — R1310 Dysphagia, unspecified: Secondary | ICD-10-CM | POA: Insufficient documentation

## 2021-10-16 NOTE — Assessment & Plan Note (Signed)
Just evaluated by cardiology.  ECHO as outlined.  No dizziness or light headedness now.  Follow.

## 2021-10-16 NOTE — Assessment & Plan Note (Signed)
-

## 2021-10-16 NOTE — Assessment & Plan Note (Signed)
Seeing ophthalmology.  Astigmatism.  No vision change now.

## 2021-10-16 NOTE — Assessment & Plan Note (Signed)
Has noticed increased acid reflux and dysphagia.  Treat with protonix as directed.  Refer to GI for evaluation and question of need for EGD.  Discussed importance of eating slowly, taking small bites and chewing food well.

## 2021-11-03 ENCOUNTER — Telehealth: Payer: Self-pay | Admitting: Internal Medicine

## 2021-11-03 NOTE — Telephone Encounter (Signed)
Please call Marvin Farley and see if he wore the heart monitor that cardiology had recommended.  If so, need results from cardiology (Dr Clayborn Bigness).  If he did not, inform him that it looks like cardiology was previously trying to get in touch with him to place the monitor.  He can call [facility] and f/u regarding getting this placed.   ?

## 2021-11-04 NOTE — Telephone Encounter (Signed)
Tried calling patient but mailbox was full so I was unable to leave a message. ?

## 2021-11-05 NOTE — Telephone Encounter (Signed)
Mailbox full cannot reach patient. ?

## 2021-11-10 NOTE — Telephone Encounter (Signed)
Mailbox full unable to reach

## 2021-11-11 ENCOUNTER — Emergency Department
Admission: EM | Admit: 2021-11-11 | Discharge: 2021-11-11 | Disposition: A | Discharge status: Home / Self Care | Payer: Medicare HMO | Attending: Emergency Medicine | Admitting: Emergency Medicine

## 2021-11-11 ENCOUNTER — Emergency Department: Payer: Medicare HMO

## 2021-11-11 ENCOUNTER — Other Ambulatory Visit: Payer: Self-pay

## 2021-11-11 ENCOUNTER — Encounter: Payer: Self-pay | Admitting: Emergency Medicine

## 2021-11-11 DIAGNOSIS — S0990XA Unspecified injury of head, initial encounter: Secondary | ICD-10-CM | POA: Diagnosis not present

## 2021-11-11 DIAGNOSIS — S060X9A Concussion with loss of consciousness of unspecified duration, initial encounter: Secondary | ICD-10-CM | POA: Diagnosis not present

## 2021-11-11 DIAGNOSIS — W01198A Fall on same level from slipping, tripping and stumbling with subsequent striking against other object, initial encounter: Secondary | ICD-10-CM | POA: Diagnosis not present

## 2021-11-11 DIAGNOSIS — W19XXXA Unspecified fall, initial encounter: Secondary | ICD-10-CM

## 2021-11-11 DIAGNOSIS — S060X1A Concussion with loss of consciousness of 30 minutes or less, initial encounter: Secondary | ICD-10-CM | POA: Diagnosis not present

## 2021-11-11 NOTE — ED Notes (Signed)
RN first encounter with pt prior to discharge. Pt verbalized understanding of discharge instructions and follow-up care instructions. Pt was ready to go so vital signs and e-signature ws not obtained before discharge. Pt advised if symptoms worsen to return to ED.  ?

## 2021-11-11 NOTE — ED Provider Notes (Cosign Needed)
[facility] Provider Note    Event Date/Time   First MD Initiated Contact with Patient 11/11/21 1738     (approximate)   History   Chief Complaint Fall   HPI ISSAM CARLYON is a 88 y.o. male, history of aortic stenosis, nephrolithiasis, osteoporosis, BPH, presents to the emergency department for evaluation of injury sustained from fall.  Patient states that he was on a swing playing with his grandchildren when he accidentally fell backwards and hit his head on the ground.  Patient states that he may have passed out for a split second, but otherwise did not have any prolonged downtime.  Wife states that patient was mildly confused for a couple minutes and could not tell her what it was, but this confusion subsided after a few minutes.  Wife reportedly witnessed the fall and stated that it did not seem very severe.  However, they figured that he should be checked out the emergency department just to be sure.  Patient states that he had a mild headache after the fall, but this is since resolved.  Currently asymptomatic.  Denies nausea/vomiting, fever/chills, chest pain, shortness of breath, abdominal pain, back pain, or neck pain.  History Limitations: No limitations.      Physical Exam  Triage Vital Signs: ED Triage Vitals  Enc Vitals Group     BP 11/11/21 1719 (!) 113/97     Pulse Rate 11/11/21 1719 88     Resp 11/11/21 1719 18     Temp 11/11/21 1719 98 F (36.7 C)     Temp Source 11/11/21 1719 Oral     SpO2 11/11/21 1719 95 %     Weight 11/11/21 1719 140 lb (63.5 kg)     Height 11/11/21 1719 '5\' 5"'$  (1.651 m)     Head Circumference --      Peak Flow --      Pain Score 11/11/21 1719 0     Pain Loc --      Pain Edu? --      Excl. in [place]? --     Most recent vital signs: Vitals:   11/11/21 1719  BP: (!) 113/97  Pulse: 88  Resp: 18  Temp: 98 F (36.7 C)  SpO2: 95%    General: Awake, NAD.  Alert and oriented x4.  Answering all questions  appropriately. CV: Good peripheral perfusion.  Resp: Normal effort.  Abd: Soft, non-tender. No distention.  Neuro: At baseline. No gross neurological deficits.  Cranial nerves II through XII intact.  Pulse, motor, sensation intact distally in upper and lower extremities.  5/5 strength in all extremities. Other: No gross deformities.  Patient is able to ambulate on his own in a straight line without any difficulty.  Normal range of motion of the neck.  PERRL.  EOMI.  Physical Exam    ED Results / Procedures / Treatments  Labs (all labs ordered are listed, but only abnormal results are displayed) Labs Reviewed - No data to display   EKG Not applicable.   RADIOLOGY  ED Provider Interpretation: I personally reviewed these images, CT head shows no evidence of acute intracranial abnormality.  CT cervical spine shows no evidence of fracture.  CT HEAD WO CONTRAST (5MM)  Result Date: 11/11/2021 CLINICAL DATA:  Head trauma, moderate-severe EXAM: CT HEAD WITHOUT CONTRAST CT CERVICAL SPINE WITHOUT CONTRAST TECHNIQUE: Multidetector CT imaging of the head and cervical spine was performed following the standard protocol without intravenous contrast. Multiplanar CT image reconstructions of  the cervical spine were also generated. RADIATION DOSE REDUCTION: This exam was performed according to the departmental dose-optimization program which includes automated exposure control, adjustment of the mA and/or kV according to patient size and/or use of iterative reconstruction technique. COMPARISON:  October 11 21. FINDINGS: CT HEAD FINDINGS Brain: No evidence of acute infarction, hemorrhage, hydrocephalus, extra-axial collection or mass lesion/mass effect. Similar cerebral atrophy with ex vacuo ventricular dilation. Vascular: Calcific atherosclerosis. Skull: No acute fracture. Sinuses/Orbits: Largely clear sinuses.  No acute orbital findings. CT CERVICAL SPINE FINDINGS Alignment: Similar alignment. Similar slight  anterolisthesis of C4 on C5. Skull base and vertebrae: No evidence of acute fracture. Osteopenia. Soft tissues and spinal canal: No prevertebral fluid or swelling. No visible canal hematoma. Disc levels: Similar multilevel degenerative disc disease with disc height loss endplate sclerosis and posterior disc/osteophyte complexes. Similar multilevel facet arthropathy and fusion across the left-sided C3-C4 facet joint. Resulting varying degrees of neural foraminal stenosis. Upper chest: Biapical pleuroparenchymal scarring. Visualized lung apices are otherwise clear. IMPRESSION: 1. No evidence of acute intracranial abnormality. 2. No evidence of acute fracture or traumatic malalignment the cervical spine. 3. Similar chronic findings, detailed above. Electronically Signed   By: Margaretha Sheffield M.D.   On: 11/11/2021 17:55   CT CERVICAL SPINE WO CONTRAST  Result Date: 11/11/2021 CLINICAL DATA:  Head trauma, moderate-severe EXAM: CT HEAD WITHOUT CONTRAST CT CERVICAL SPINE WITHOUT CONTRAST TECHNIQUE: Multidetector CT imaging of the head and cervical spine was performed following the standard protocol without intravenous contrast. Multiplanar CT image reconstructions of the cervical spine were also generated. RADIATION DOSE REDUCTION: This exam was performed according to the departmental dose-optimization program which includes automated exposure control, adjustment of the mA and/or kV according to patient size and/or use of iterative reconstruction technique. COMPARISON:  October 11 21. FINDINGS: CT HEAD FINDINGS Brain: No evidence of acute infarction, hemorrhage, hydrocephalus, extra-axial collection or mass lesion/mass effect. Similar cerebral atrophy with ex vacuo ventricular dilation. Vascular: Calcific atherosclerosis. Skull: No acute fracture. Sinuses/Orbits: Largely clear sinuses.  No acute orbital findings. CT CERVICAL SPINE FINDINGS Alignment: Similar alignment. Similar slight anterolisthesis of C4 on C5. Skull  base and vertebrae: No evidence of acute fracture. Osteopenia. Soft tissues and spinal canal: No prevertebral fluid or swelling. No visible canal hematoma. Disc levels: Similar multilevel degenerative disc disease with disc height loss endplate sclerosis and posterior disc/osteophyte complexes. Similar multilevel facet arthropathy and fusion across the left-sided C3-C4 facet joint. Resulting varying degrees of neural foraminal stenosis. Upper chest: Biapical pleuroparenchymal scarring. Visualized lung apices are otherwise clear. IMPRESSION: 1. No evidence of acute intracranial abnormality. 2. No evidence of acute fracture or traumatic malalignment the cervical spine. 3. Similar chronic findings, detailed above. Electronically Signed   By: Margaretha Sheffield M.D.   On: 11/11/2021 17:55    PROCEDURES:  Critical Care performed: None.  Procedures    MEDICATIONS ORDERED IN ED: Medications - No data to display   IMPRESSION / MDM / [place] / ED COURSE  I reviewed the triage vital signs and the nursing notes.                               Differential diagnosis includes, but is not limited to, epidural/subdural hematoma, concussion, cervical fracture, cervical strain.  ED Course Patient appears well.  Vital signs within normal limits.  NAD.  Head CT shows no evidence of acute intracranial abnormality.  See above for details.  Cervical  spine CT shows no evidence of cervical fracture.  See above for details.  Assessment/Plan Given the patient's history, physical exam, and work-up, I do not suspect any serious or life-threatening injuries.  CT reassuring for no evidence of acute intracranial abnormalities or cervical spine fracture.  Given the patient's brief loss of consciousness and mild temporary confusion, I suspect patient may have suffered from a concussion.  We will provide him with educational material.  Encouraged him to look out for postconcussive signs and follow-up with his  primary care provider as needed.  We will discharge this patient.  Patient was provided with anticipatory guidance, return precautions, and educational material. Encouraged the patient to return to the emergency department at any time if they begin to experience any new or worsening symptoms.       FINAL CLINICAL IMPRESSION(S) / ED DIAGNOSES   Final diagnoses:  Fall, initial encounter  Concussion with loss of consciousness, initial encounter     Rx / DC Orders   ED Discharge Orders     None        Note:  This document was prepared using Dragon voice recognition software and may include unintentional dictation errors.   Teodoro Spray, [place] 11/11/21 [phone]

## 2021-11-11 NOTE — ED Triage Notes (Addendum)
Pt via POV from home. Pt fell backwards out of swing onto grass. Pt states he had a headache but it subsided. Incident happened 3:30pm. Denies any blood thinners.  Pt is A&OX4 and NAD. Denies any pain any where else.  ?

## 2021-11-11 NOTE — Discharge Instructions (Signed)
-  Treat pain with Tylenol/ibuprofen as needed. ?-Follow-up with your primary care provider as needed. ?-Return to the emergency department anytime if you begin to experience any new or worsening symptoms. ?

## 2021-12-28 ENCOUNTER — Other Ambulatory Visit (INDEPENDENT_AMBULATORY_CARE_PROVIDER_SITE_OTHER): Payer: Medicare HMO

## 2021-12-28 DIAGNOSIS — Z13220 Encounter for screening for lipoid disorders: Secondary | ICD-10-CM

## 2021-12-28 DIAGNOSIS — I350 Nonrheumatic aortic (valve) stenosis: Secondary | ICD-10-CM | POA: Diagnosis not present

## 2021-12-28 LAB — BASIC METABOLIC PANEL
BUN: 16 mg/dL (ref 6–23)
CO2: 26 mEq/L (ref 19–32)
Calcium: 9 mg/dL (ref 8.4–10.5)
Chloride: 102 mEq/L (ref 96–112)
Creatinine, Ser: 1.03 mg/dL (ref 0.40–1.50)
GFR: 64.98 mL/min (ref >60.00)
Glucose, Bld: 89 mg/dL (ref 70–99)
Potassium: 4.8 mEq/L (ref 3.5–5.1)
Sodium: 136 mEq/L (ref 135–145)

## 2021-12-28 LAB — HEPATIC FUNCTION PANEL
ALT: 6 U/L (ref 0–53)
AST: 20 U/L (ref 0–37)
Albumin: 4.2 g/dL (ref 3.5–5.2)
Alkaline Phosphatase: 69 U/L (ref 39–117)
Bilirubin, Direct: 0.1 mg/dL (ref 0.0–0.3)
Total Bilirubin: 0.6 mg/dL (ref 0.2–1.2)
Total Protein: 6.7 g/dL (ref 6.0–8.3)

## 2021-12-28 LAB — LIPID PANEL
Cholesterol: 162 mg/dL (ref 0–200)
HDL: 53.2 mg/dL (ref >39.00)
LDL Cholesterol: 91 mg/dL (ref 0–99)
NonHDL: 108.53
Total CHOL/HDL Ratio: 3
Triglycerides: 87 mg/dL (ref 0.0–149.0)
VLDL: 17.4 mg/dL (ref 0.0–40.0)

## 2021-12-30 ENCOUNTER — Ambulatory Visit (INDEPENDENT_AMBULATORY_CARE_PROVIDER_SITE_OTHER): Payer: Medicare HMO | Admitting: Internal Medicine

## 2021-12-30 ENCOUNTER — Encounter: Payer: Self-pay | Admitting: Internal Medicine

## 2021-12-30 VITALS — BP 124/74 | HR 70 | Temp 98.1°F | Resp 14 | Ht 65.0 in | Wt 143.2 lb

## 2021-12-30 DIAGNOSIS — N400 Enlarged prostate without lower urinary tract symptoms: Secondary | ICD-10-CM | POA: Diagnosis not present

## 2021-12-30 DIAGNOSIS — Z13220 Encounter for screening for lipoid disorders: Secondary | ICD-10-CM

## 2021-12-30 DIAGNOSIS — R1310 Dysphagia, unspecified: Secondary | ICD-10-CM

## 2021-12-30 DIAGNOSIS — E559 Vitamin D deficiency, unspecified: Secondary | ICD-10-CM

## 2021-12-30 DIAGNOSIS — I350 Nonrheumatic aortic (valve) stenosis: Secondary | ICD-10-CM | POA: Diagnosis not present

## 2021-12-30 DIAGNOSIS — E871 Hypo-osmolality and hyponatremia: Secondary | ICD-10-CM

## 2021-12-30 DIAGNOSIS — R972 Elevated prostate specific antigen [PSA]: Secondary | ICD-10-CM | POA: Diagnosis not present

## 2021-12-30 DIAGNOSIS — W19XXXD Unspecified fall, subsequent encounter: Secondary | ICD-10-CM

## 2021-12-30 NOTE — Progress Notes (Signed)
Patient ID: Marvin Farley, male   DOB: [birth year], 88 y.o.   MRN: [id number] ? ? ?Subjective:  ? ? Patient ID: Marvin Farley, male    DOB: 07/21/1932, 88 y.o.   MRN: [id number] ? ?This visit occurred during the SARS-CoV-2 public health emergency.  Safety protocols were in place, including screening questions prior to the visit, additional usage of staff PPE, and extensive cleaning of exam room while observing appropriate contact time as indicated for disinfecting solutions.  ? ?Patient here for a scheduled follow up.  ? ?Chief Complaint  ?Patient presents with  ? Follow-up  ?  Follow up for HLD, and GERD  ? Gastroesophageal Reflux  ? Hyperlipidemia  ? .  ? ?HPI ?Recently - fell off a swing.  ER evaluation.  CT neck and head - no acute abnormality.  No residual headache, dizziness or light headedness.  Stays active.  No chest pain or sob. No acid reflux or dysphagia now.  Off protonix.  These problems have resolved.  Is eating slower and chewing food better.  No nausea or vomiting.  No abdominal pain.  Bowels moving.   ? ? ?Past Medical History:  ?Diagnosis Date  ? BPH (benign prostatic hypertrophy)   ? Kidney stones   ? Left wrist fracture 1984  ? Osteoporosis   ? Right wrist fracture 1996  ? ?Past Surgical History:  ?Procedure Laterality Date  ? HERNIA REPAIR Bilateral   ? inguinal  ? TONSILLECTOMY    ? ?Family History  ?Problem Relation Age of Onset  ? Cancer Mother   ?     leukemia  ? Heart disease Father   ?     heart attack  ? Cancer Brother   ?     Lymphoma  ? ?Social History  ? ?Socioeconomic History  ? Marital status: Married  ?  Spouse name: Not on file  ? Number of children: Not on file  ? Years of education: Not on file  ? Highest education level: Not on file  ?Occupational History  ? Not on file  ?Tobacco Use  ? Smoking status: Never  ? Smokeless tobacco: Never  ?Vaping Use  ? Vaping Use: Never used  ?Substance and Sexual Activity  ? Alcohol use: No  ?  Alcohol/week: 0.0 standard drinks  ? Drug use: No   ? Sexual activity: Not on file  ?Other Topics Concern  ? Not on file  ?Social History Narrative  ? Not on file  ? ?Social Determinants of Health  ? ?Financial Resource Strain: Low Risk   ? Difficulty of Paying Living Expenses: Not hard at all  ?Food Insecurity: No Food Insecurity  ? Worried About Charity fundraiser in the Last Year: Never true  ? Ran Out of Food in the Last Year: Never true  ?Transportation Needs: No Transportation Needs  ? Lack of Transportation (Medical): No  ? Lack of Transportation (Non-Medical): No  ?Physical Activity: Insufficiently Active  ? Days of Exercise per Week: 4 days  ? Minutes of Exercise per Session: 30 min  ?Stress: No Stress Concern Present  ? Feeling of Stress : Not at all  ?Social Connections: Unknown  ? Frequency of Communication with Friends and Family: Not on file  ? Frequency of Social Gatherings with Friends and Family: Not on file  ? Attends Religious Services: Not on file  ? Active Member of Clubs or Organizations: Not on file  ? Attends Archivist Meetings: Not on file  ?  Marital Status: Married  ? ? ? ?Review of Systems  ?Constitutional:  Negative for appetite change and unexpected weight change.  ?HENT:  Negative for congestion and sinus pressure.   ?Respiratory:  Negative for cough, chest tightness and shortness of breath.   ?Cardiovascular:  Negative for chest pain, palpitations and leg swelling.  ?Gastrointestinal:  Negative for abdominal pain, diarrhea, nausea and vomiting.  ?Genitourinary:  Negative for difficulty urinating and dysuria.  ?Musculoskeletal:  Negative for joint swelling and myalgias.  ?Skin:  Negative for color change and rash.  ?Neurological:  Negative for dizziness, light-headedness and headaches.  ?Psychiatric/Behavioral:  Negative for agitation and dysphoric mood.   ? ?   ?Objective:  ?  ? ?BP 124/74 (BP Location: Left Arm, Patient Position: Sitting, Cuff Size: Small)   Pulse 70   Temp 98.1 ?F (36.7 ?C) (Temporal)   Resp 14   Ht  '5\' 5"'$  (1.651 m)   Wt 143 lb 3.2 oz (65 kg)   SpO2 99%   BMI 23.83 kg/m?  ?Wt Readings from Last 3 Encounters:  ?12/30/21 143 lb 3.2 oz (65 kg)  ?11/11/21 140 lb (63.5 kg)  ?10/15/21 148 lb 9.6 oz (67.4 kg)  ? ? ?Physical Exam ?Constitutional:   ?   General: He is not in acute distress. ?   Appearance: Normal appearance. He is well-developed.  ?HENT:  ?   Head: Normocephalic and atraumatic.  ?   Right Ear: External ear normal.  ?   Left Ear: External ear normal.  ?Eyes:  ?   General: No scleral icterus.    ?   Right eye: No discharge.     ?   Left eye: No discharge.  ?Cardiovascular:  ?   Rate and Rhythm: Normal rate and regular rhythm.  ?Pulmonary:  ?   Effort: Pulmonary effort is normal. No respiratory distress.  ?   Breath sounds: Normal breath sounds.  ?Abdominal:  ?   General: Bowel sounds are normal.  ?   Palpations: Abdomen is soft.  ?   Tenderness: There is no abdominal tenderness.  ?Musculoskeletal:     ?   General: No swelling or tenderness.  ?   Cervical back: Neck supple. No tenderness.  ?Lymphadenopathy:  ?   Cervical: No cervical adenopathy.  ?Skin: ?   Findings: No erythema or rash.  ?Neurological:  ?   Mental Status: He is alert.  ?Psychiatric:     ?   Mood and Affect: Mood normal.     ?   Behavior: Behavior normal.  ? ? ? ?Outpatient Encounter Medications as of 12/30/2021  ?Medication Sig  ? Ascorbic Acid (VITAMIN C) 500 MG CAPS Take by mouth 2 (two) times daily.  ? brimonidine (ALPHAGAN) 0.2 % ophthalmic solution Place into the right eye.  ? cholecalciferol (VITAMIN D3) 25 MCG (1000 UT) tablet Take 1,000 Units by mouth daily.  ? Multiple Vitamin (MULTIVITAMIN) capsule Take 1 capsule by mouth daily.  ? silodosin (RAPAFLO) 4 MG CAPS capsule Take 1 capsule (4 mg total) by mouth daily with breakfast.  ? [DISCONTINUED] pantoprazole (PROTONIX) 40 MG tablet Take 1 tablet (40 mg total) by mouth daily. Take 30 minutes before your evening meal. (Patient not taking: Reported on 12/30/2021)  ? ?No  facility-administered encounter medications on file as of 12/30/2021.  ?  ? ?Lab Results  ?Component Value Date  ? WBC 4.7 07/12/2021  ? HGB 13.6 07/12/2021  ? HCT 40.6 07/12/2021  ? PLT 198.0 07/12/2021  ? GLUCOSE  89 12/28/2021  ? CHOL 162 12/28/2021  ? TRIG 87.0 12/28/2021  ? HDL 53.20 12/28/2021  ? [place] 91 12/28/2021  ? ALT 6 12/28/2021  ? AST 20 12/28/2021  ? NA 136 12/28/2021  ? K 4.8 12/28/2021  ? CL 102 12/28/2021  ? CREATININE 1.03 12/28/2021  ? BUN 16 12/28/2021  ? CO2 26 12/28/2021  ? TSH 1.25 07/12/2021  ? PSA 6.18 (H) 12/26/2019  ? ? ?CT HEAD WO CONTRAST (5MM) ? ?Result Date: 11/11/2021 ?CLINICAL DATA:  Head trauma, moderate-severe EXAM: CT HEAD WITHOUT CONTRAST CT CERVICAL SPINE WITHOUT CONTRAST TECHNIQUE: Multidetector CT imaging of the head and cervical spine was performed following the standard protocol without intravenous contrast. Multiplanar CT image reconstructions of the cervical spine were also generated. RADIATION DOSE REDUCTION: This exam was performed according to the departmental dose-optimization program which includes automated exposure control, adjustment of the mA and/or kV according to patient size and/or use of iterative reconstruction technique. COMPARISON:  October 11 21. FINDINGS: CT HEAD FINDINGS Brain: No evidence of acute infarction, hemorrhage, hydrocephalus, extra-axial collection or mass lesion/mass effect. Similar cerebral atrophy with ex vacuo ventricular dilation. Vascular: Calcific atherosclerosis. Skull: No acute fracture. Sinuses/Orbits: Largely clear sinuses.  No acute orbital findings. CT CERVICAL SPINE FINDINGS Alignment: Similar alignment. Similar slight anterolisthesis of C4 on C5. Skull base and vertebrae: No evidence of acute fracture. Osteopenia. Soft tissues and spinal canal: No prevertebral fluid or swelling. No visible canal hematoma. Disc levels: Similar multilevel degenerative disc disease with disc height loss endplate sclerosis and posterior  disc/osteophyte complexes. Similar multilevel facet arthropathy and fusion across the left-sided C3-C4 facet joint. Resulting varying degrees of neural foraminal stenosis. Upper chest: Biapical pleuroparenchymal scarring. Visualized

## 2022-01-09 ENCOUNTER — Encounter: Payer: Self-pay | Admitting: Internal Medicine

## 2022-01-09 DIAGNOSIS — W19XXXA Unspecified fall, initial encounter: Secondary | ICD-10-CM | POA: Insufficient documentation

## 2022-01-09 NOTE — Assessment & Plan Note (Signed)
-

## 2022-01-09 NOTE — Assessment & Plan Note (Addendum)
Evaluated by cardiology.  No dizziness or light headedness now.  Follow.  ?

## 2022-01-09 NOTE — Assessment & Plan Note (Signed)
Follow vitamin D level.  

## 2022-01-09 NOTE — Assessment & Plan Note (Signed)
Not an issue now.  Off protonix.  Chewing food well.  Eating slowly.  Doing better.  Follow.  ?

## 2022-01-09 NOTE — Assessment & Plan Note (Signed)
Evaluated in ER as outlined.  [place] out of a swing.  No residual problems.  Follow.  ?

## 2022-01-21 ENCOUNTER — Emergency Department
Admission: EM | Admit: 2022-01-21 | Discharge: 2022-01-21 | Disposition: A | Discharge status: Home / Self Care | Payer: Medicare HMO | Attending: Emergency Medicine | Admitting: Emergency Medicine

## 2022-01-21 ENCOUNTER — Encounter: Payer: Self-pay | Admitting: Intensive Care

## 2022-01-21 ENCOUNTER — Other Ambulatory Visit: Payer: Self-pay

## 2022-01-21 DIAGNOSIS — Z20822 Contact with and (suspected) exposure to covid-19: Secondary | ICD-10-CM | POA: Insufficient documentation

## 2022-01-21 DIAGNOSIS — R112 Nausea with vomiting, unspecified: Secondary | ICD-10-CM | POA: Diagnosis present

## 2022-01-21 DIAGNOSIS — K529 Noninfective gastroenteritis and colitis, unspecified: Secondary | ICD-10-CM

## 2022-01-21 LAB — COMPREHENSIVE METABOLIC PANEL
ALT: 9 U/L (ref 0–44)
AST: 21 U/L (ref 15–41)
Albumin: 4.2 g/dL (ref 3.5–5.0)
Alkaline Phosphatase: 70 U/L (ref 38–126)
Anion gap: 11 (ref 5–15)
BUN: 23 mg/dL (ref 8–23)
CO2: 23 mmol/L (ref 22–32)
Calcium: 8.9 mg/dL (ref 8.9–10.3)
Chloride: 104 mmol/L (ref 98–111)
Creatinine, Ser: 1.09 mg/dL (ref 0.61–1.24)
GFR, Estimated: >60 mL/min (ref >60)
Glucose, Bld: 125 mg/dL — ABNORMAL HIGH (ref 70–99)
Potassium: 4.4 mmol/L (ref 3.5–5.1)
Sodium: 138 mmol/L (ref 135–145)
Total Bilirubin: 1 mg/dL (ref 0.3–1.2)
Total Protein: 7.5 g/dL (ref 6.5–8.1)

## 2022-01-21 LAB — RESP PANEL BY RT-PCR (FLU A&B, COVID) ARPGX2
Influenza A by PCR: NEGATIVE
Influenza B by PCR: NEGATIVE
SARS Coronavirus 2 by RT PCR: NEGATIVE

## 2022-01-21 LAB — CBC
HCT: 49.5 % (ref 39.0–52.0)
Hemoglobin: 15.9 g/dL (ref 13.0–17.0)
MCH: 28.6 pg (ref 26.0–34.0)
MCHC: 32.1 g/dL (ref 30.0–36.0)
MCV: 89 fL (ref 80.0–100.0)
Platelets: 229 10*3/uL (ref 150–400)
RBC: 5.56 MIL/uL (ref 4.22–5.81)
RDW: 14.6 % (ref 11.5–15.5)
WBC: 8.7 10*3/uL (ref 4.0–10.5)
nRBC: 0 % (ref 0.0–0.2)

## 2022-01-21 LAB — URINALYSIS, ROUTINE W REFLEX MICROSCOPIC
Bacteria, UA: NONE SEEN
Bilirubin Urine: NEGATIVE
Glucose, UA: NEGATIVE mg/dL
Ketones, ur: 5 mg/dL — AB
Leukocytes,Ua: NEGATIVE
Nitrite: NEGATIVE
Protein, ur: 30 mg/dL — AB
Specific Gravity, Urine: 1.023 (ref 1.005–1.030)
Squamous Epithelial / HPF: NONE SEEN (ref 0–5)
pH: 5 (ref 5.0–8.0)

## 2022-01-21 LAB — LIPASE, BLOOD: Lipase: 32 U/L (ref 11–51)

## 2022-01-21 MED ORDER — SODIUM CHLORIDE 0.9 % IV BOLUS
500.0000 mL | Freq: Once | INTRAVENOUS | Status: AC
  Administered 2022-01-21: 500 mL via INTRAVENOUS

## 2022-01-21 MED ORDER — ONDANSETRON 4 MG PO TBDP
4.0000 mg | ORAL_TABLET | Freq: Once | ORAL | Status: AC | PRN
  Administered 2022-01-21: 4 mg via ORAL
  Filled 2022-01-21: qty 1

## 2022-01-21 MED ORDER — ONDANSETRON HCL 4 MG/2ML IJ SOLN
4.0000 mg | Freq: Once | INTRAMUSCULAR | Status: AC
  Administered 2022-01-21: 4 mg via INTRAVENOUS
  Filled 2022-01-21: qty 2

## 2022-01-21 MED ORDER — ONDANSETRON 4 MG PO TBDP: 4.0000 mg | ORAL_TABLET | Freq: Three times a day (TID) | ORAL | 0 refills | Status: AC | PRN

## 2022-01-21 NOTE — Discharge Instructions (Addendum)
Your work-up was reassuring with reassuring blood work.  There was a little bit of microscopic blood in your urine that you can follow-up with your primary care doctor but otherwise everything was very reassuring.  I suspect that this is a viral illness and you can take Zofran to help and return to the ER if you develop worsening symptoms or any other concerns.  If you develop abdominal pain you may need to return for repeat evaluation to decide if CT is needed.

## 2022-01-21 NOTE — ED Provider Notes (Signed)
[facility] Provider Note    Event Date/Time   First MD Initiated Contact with Patient 01/21/22 1501     (approximate)   History   Emesis   HPI  Marvin Farley is a 88 y.o. male with aortic valve stenosis, BPH who comes in with concerns for emesis.  Family report that they ate out at a restaurant they dont typically eat out at.  He woke up this morning having nausea, vomiting and diarrhea.  He reports 8 episodes of nonbloody nonbilious vomiting as well as 3 episodes of diarrhea.  He denies any pain at this time.  No chest pain, no shortness of breath.   Physical Exam   Triage Vital Signs: ED Triage Vitals  Enc Vitals Group     BP 01/21/22 1300 106/67     Pulse Rate 01/21/22 1300 (!) 112     Resp 01/21/22 1300 14     Temp 01/21/22 1300 99 F (37.2 C)     Temp Source 01/21/22 1300 Oral     SpO2 01/21/22 1300 97 %     Weight 01/21/22 1301 148 lb (67.1 kg)     Height 01/21/22 1301 5' 5.5" (1.664 m)     Head Circumference --      Peak Flow --      Pain Score 01/21/22 1301 0     Pain Loc --      Pain Edu? --      Excl. in [place]? --     Most recent vital signs: Vitals:   01/21/22 1300  BP: 106/67  Pulse: (!) 112  Resp: 14  Temp: 99 F (37.2 C)  SpO2: 97%     General: Awake, no distress.  CV:  Good peripheral perfusion.  Resp:  Normal effort.  Abd:  No distention.  Soft and nontender Other:     ED Results / Procedures / Treatments   Labs (all labs ordered are listed, but only abnormal results are displayed) Labs Reviewed  COMPREHENSIVE METABOLIC PANEL - Abnormal; Notable for the following components:      Result Value   Glucose, Bld 125 (*)    All other components within normal limits  URINALYSIS, ROUTINE W REFLEX MICROSCOPIC - Abnormal; Notable for the following components:   Color, Urine YELLOW (*)    APPearance CLEAR (*)    Hgb urine dipstick SMALL (*)    Ketones, ur 5 (*)    Protein, ur 30 (*)    All other components  within normal limits  RESP PANEL BY RT-PCR (FLU A&B, COVID) ARPGX2  LIPASE, BLOOD  CBC      RADIOLOGY I have reviewed and interpreted the CT head from 11/11/2021 without evidence of intracranial mass   PROCEDURES:  Critical Care performed: No  Procedures   MEDICATIONS ORDERED IN ED: Medications  sodium chloride 0.9 % bolus 500 mL (has no administration in time range)  ondansetron (ZOFRAN) injection 4 mg (has no administration in time range)  ondansetron (ZOFRAN-ODT) disintegrating tablet 4 mg (4 mg Oral Given 01/21/22 1305)     IMPRESSION / MDM / ASSESSMENT AND PLAN / ED COURSE  I reviewed the triage vital signs and the nursing notes.  Patient comes in with nausea, vomiting, diarrhea.  Differential includes electrolyte abnormalities, AKI.  Considered the possibility of intracranial mass but he is neuro intact and I reviewed the CT head that was otherwise reassuring back in March.  His abdomen is soft and nontender I have low  suspicion for acute abdominal pathology.  Patient be given some IV fluids given slightly tachycardic and IV Zofran   UA overall reassuring with slight amount of ketones a little bit of microscopic blood.  Lipase normal CMP normal  Repeat evaluation patient's heart rates come down to 80.  He is tolerated p.o.  Repeat abdominal exam is soft and nontender.  Discussed with patient and no indication for CT at this time given really reassuring exam he reports feeling much better and feels comfortable with discharge.  We did discuss return precautions including abdominal pain but at this time he is requesting discharge home.  His COVID and flu swab are pending at time of discharge     FINAL CLINICAL IMPRESSION(S) / ED DIAGNOSES   Final diagnoses:  Gastroenteritis     Rx / DC Orders   ED Discharge Orders          Ordered    ondansetron (ZOFRAN-ODT) 4 MG disintegrating tablet  Every 8 hours PRN        01/21/22 1643             Note:  This  document was prepared using Dragon voice recognition software and may include unintentional dictation errors.   Vanessa Mason, MD 01/21/22 [phone]

## 2022-01-21 NOTE — ED Notes (Signed)
Pt given crackers and water for PO challenge

## 2022-01-21 NOTE — ED Triage Notes (Signed)
Patient c/o emesis since 4am. Denies pain

## 2022-02-03 IMAGING — MR MR HEAD W/O CM
11 series · 48 of 48 positions shown · non-contrast
Comparison: Head CT June 15, 2020

CLINICAL DATA: Aortic valve stenosis. Vision changes. Non
intractable headache.

EXAM:
MRI HEAD WITHOUT CONTRAST
TECHNIQUE: Multiplanar, multiecho pulse sequences of the brain and surrounding
structures were obtained without intravenous contrast.

[Series 5: ax dwi_tracew · axial · 3.0mm · 0.65mm/px · z∈[-91,+64]mm · 4 of 48 slices shown]
[im 1/48]
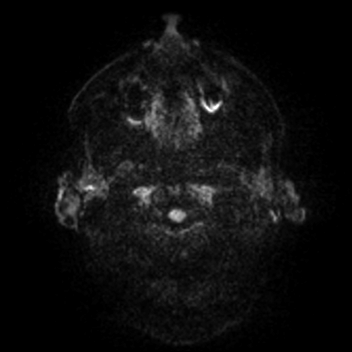
[im 16/48]
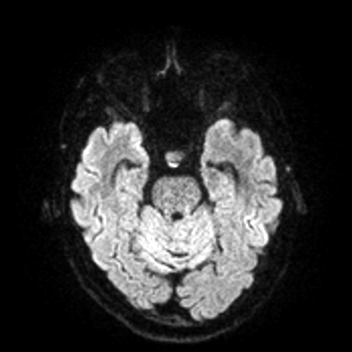
[im 32/48]
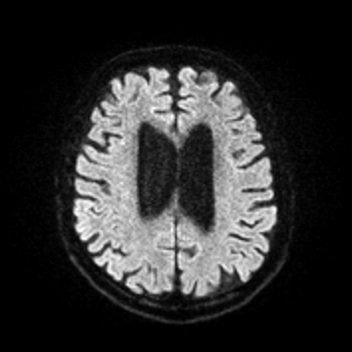
[im 48/48]
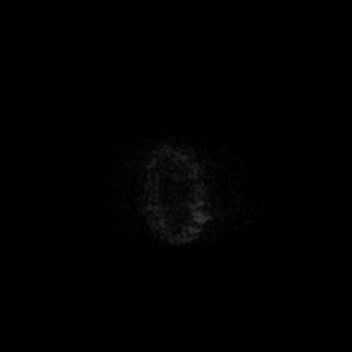

[Series 6: ax dwi_adc · axial · 3.0mm · 0.65mm/px · z∈[-91,+64]mm · 4 of 48 slices shown]
[im 1/48]
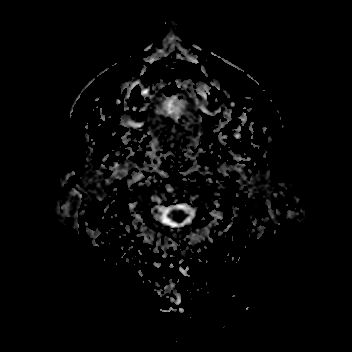
[im 16/48]
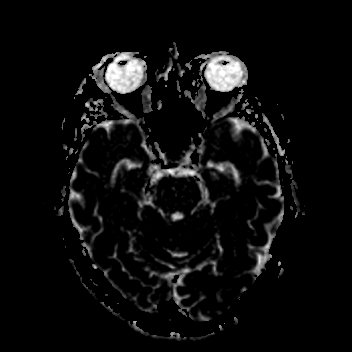
[im 32/48]
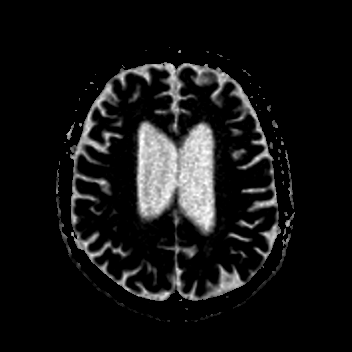
[im 48/48]
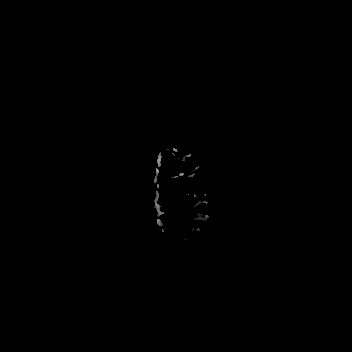

[Series 7: cor dwi_tracew · coronal · 5.0mm · 0.65mm/px · 3 of 40 slices shown]
[im 1/40]
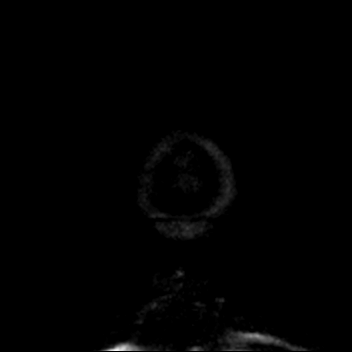
[im 20/40]
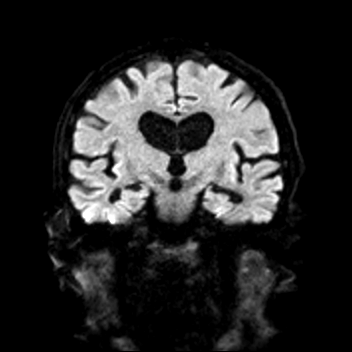
[im 40/40]
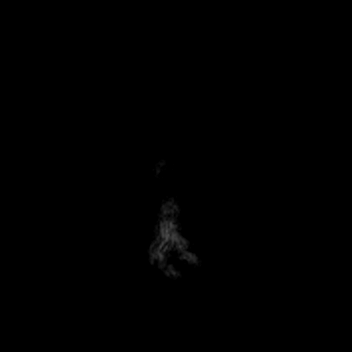

[Series 8: cor dwi_adc · coronal · 5.0mm · 0.65mm/px · 3 of 40 slices shown]
[im 1/40]
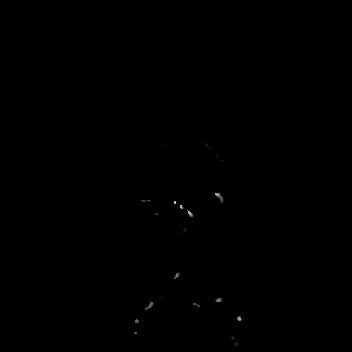
[im 20/40]
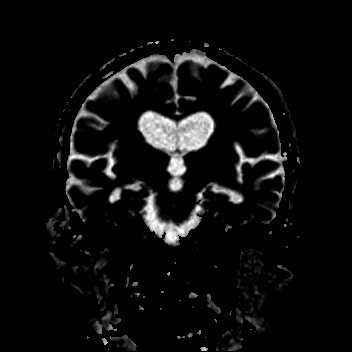
[im 40/40]
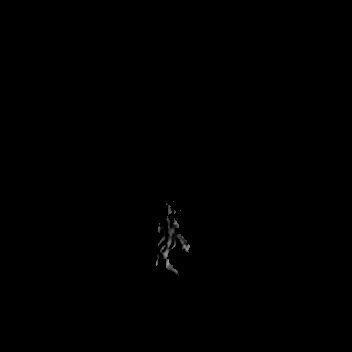

[Series 9: T1 · sagittal · 5.0mm · 0.62mm/px · 2 of 23 slices shown (1 of 2)]
[im 1/23]
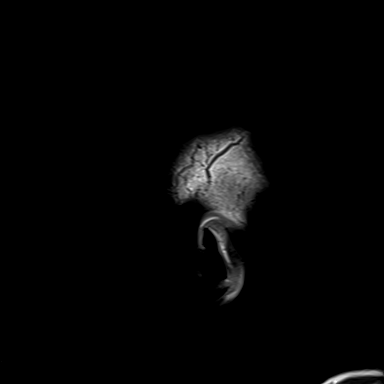
[im 23/23]
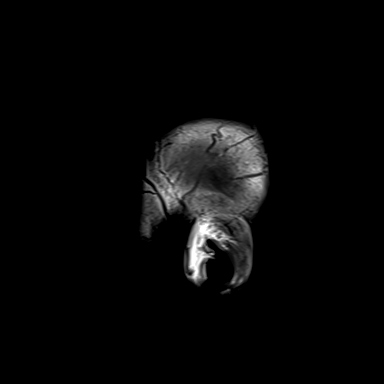

[Series 10: T2 · axial · 5.0mm · 0.53mm/px · z∈[-85,+58]mm · 2 of 25 slices shown (1 of 2)]
[im 1/25]
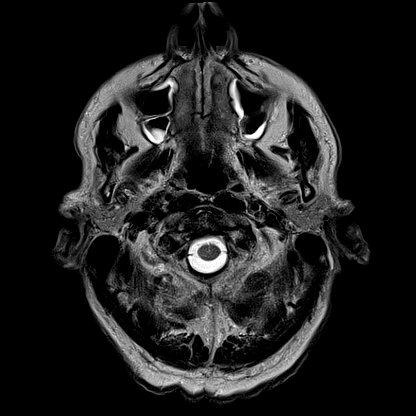
[im 25/25]
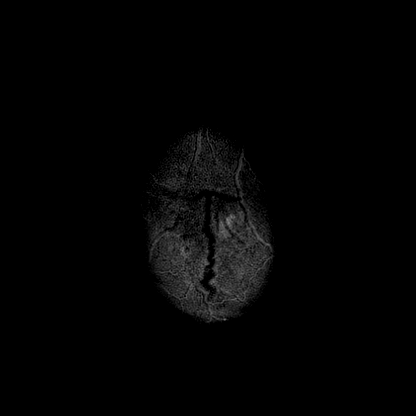

[Series 12: pha_images · axial · 3.0mm · 0.90mm/px · z∈[-99,+71]mm · 5 of 58 slices shown]
[im 1/58]
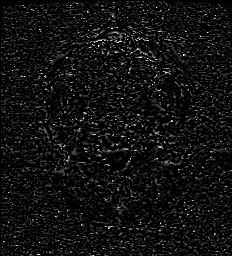
[im 15/58]
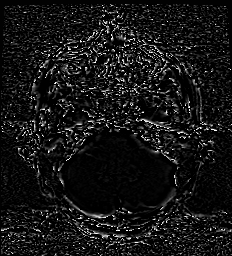
[im 29/58]
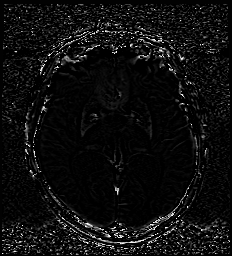
[im 43/58]
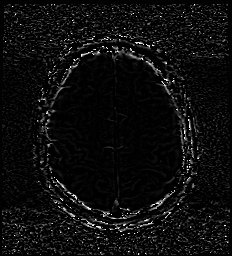
[im 58/58]
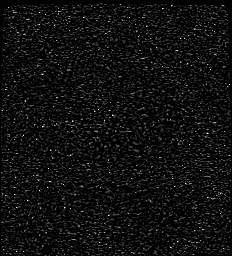

[Series 13: swi_images · axial · 3.0mm · 0.90mm/px · z∈[-102,+74]mm · 5 of 60 slices shown]
[im 1/60]
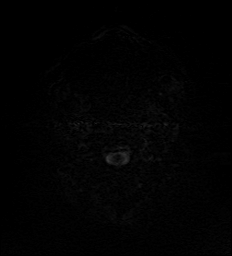
[im 15/60]
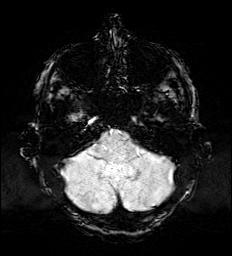
[im 30/60]
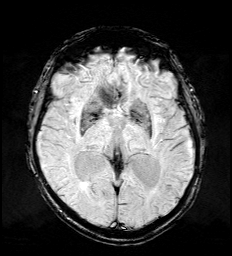
[im 45/60]
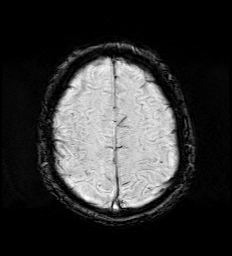
[im 60/60]
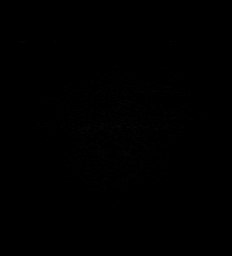

[Series 15: FLAIR · axial · 3.0mm · 0.53mm/px · z∈[-94,+67]mm · 4 of 55 slices shown]
[im 1/55]
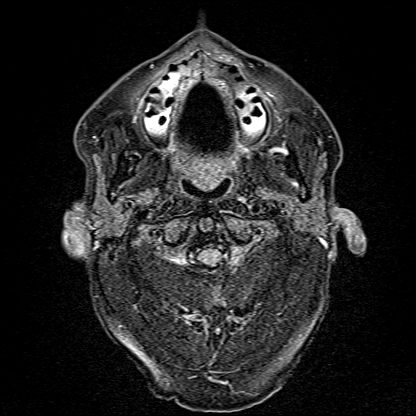
[im 19/55]
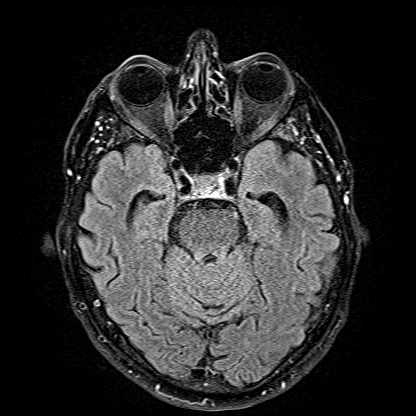
[im 37/55]
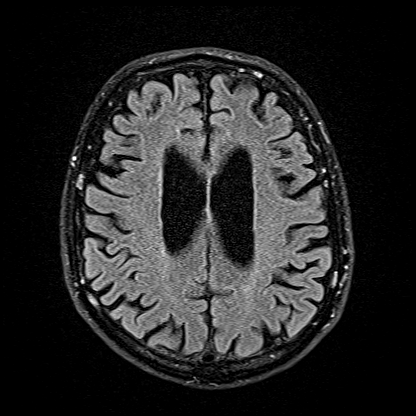
[im 55/55]
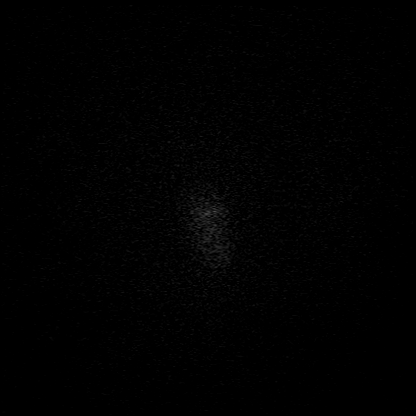

[Series 16: T1 · axial · 1.0mm · 0.98mm/px · z∈[-100,+74]mm · 14 of 176 slices shown (2 of 2)]
[im 1/176]
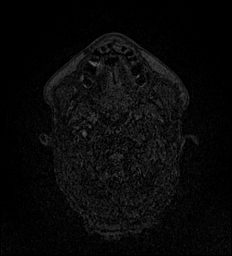
[im 14/176]
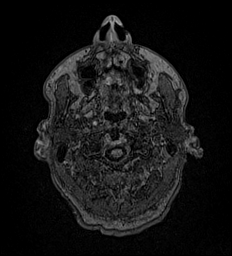
[im 27/176]
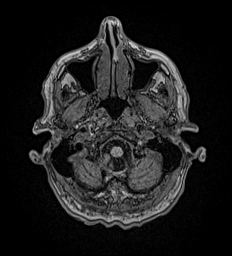
[im 41/176]
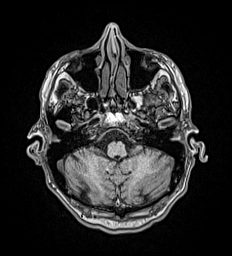
[im 54/176]
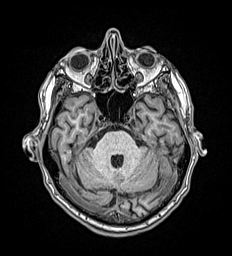
[im 68/176]
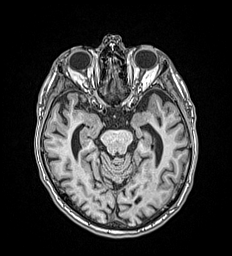
[im 81/176]
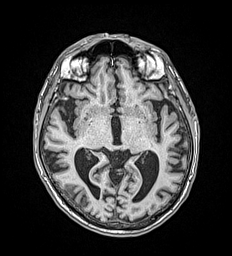
[im 95/176]
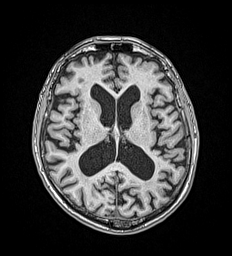
[im 108/176]
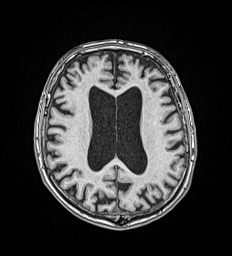
[im 122/176]
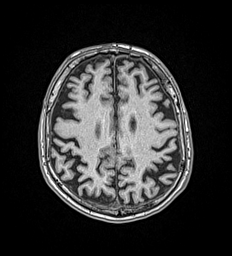
[im 135/176]
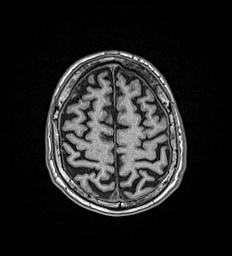
[im 149/176]
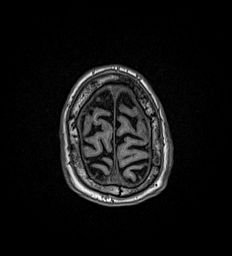
[im 162/176]
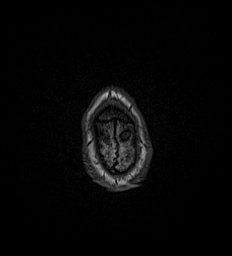
[im 176/176]
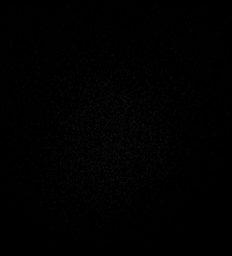

[Series 17: T2 · coronal · 5.0mm · 0.57mm/px · 2 of 29 slices shown (2 of 2)]
[im 1/29]
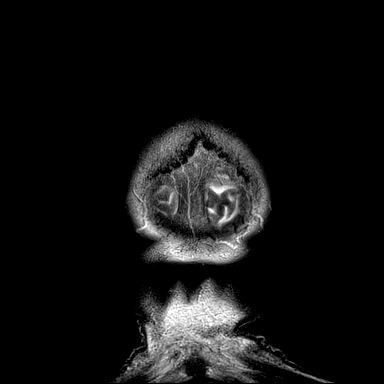
[im 29/29]
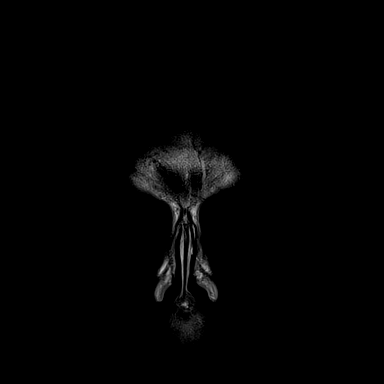

[48 of 48 positions shown; findings below may reference images not displayed]

FINDINGS: Brain: No acute infarction, hemorrhage, hydrocephalus, extra-axial
collection or mass lesion. A few scattered foci of T2 hyperintensity
within the white matter of the cerebral hemispheres, nonspecific,
most likely related to mild chronic microangiopathic changes.
Moderate parenchymal volume loss, predominantly central.

Vascular: Normal flow voids.

Skull and upper cervical spine: Normal marrow signal.

Sinuses/Orbits: Mild mucosal thickening throughout the paranasal
sinuses. The orbits are maintained.

Other: Mild left mastoid effusion
IMPRESSION: 1. No acute intracranial abnormality.
2. Mild chronic microangiopathic changes and moderate parenchymal
volume loss.
3. Mild left mastoid effusion.

## 2022-02-09 ENCOUNTER — Encounter: Payer: Self-pay | Admitting: Internal Medicine

## 2022-02-09 ENCOUNTER — Ambulatory Visit (INDEPENDENT_AMBULATORY_CARE_PROVIDER_SITE_OTHER): Payer: Medicare HMO | Admitting: Internal Medicine

## 2022-02-09 VITALS — BP 106/76 | HR 95 | Temp 98.6°F | Ht 65.0 in | Wt 144.6 lb

## 2022-02-09 DIAGNOSIS — R0982 Postnasal drip: Secondary | ICD-10-CM

## 2022-02-09 DIAGNOSIS — J40 Bronchitis, not specified as acute or chronic: Secondary | ICD-10-CM | POA: Diagnosis not present

## 2022-02-09 DIAGNOSIS — R051 Acute cough: Secondary | ICD-10-CM

## 2022-02-09 LAB — POC COVID19 BINAXNOW: SARS Coronavirus 2 Ag: NEGATIVE

## 2022-02-09 MED ORDER — BENZONATATE 100 MG PO CAPS: 100.0000 mg | ORAL_CAPSULE | Freq: Two times a day (BID) | ORAL | 1 refills | Status: DC | PRN

## 2022-02-09 NOTE — Assessment & Plan Note (Addendum)
WITH COUGH.  No pulmonary symptoms and exam normal.  Recommend use of benadryl and allegra   Continue tessalon perles prn . COVID TEST WAS NEGATIVE

## 2022-02-09 NOTE — Progress Notes (Signed)
Subjective:  Patient ID: Marvin Farley, male    DOB: [birth year]  Age: 88 y.o. MRN: [id number]  CC: The primary encounter diagnosis was Acute cough. Diagnoses of Bronchitis and Post-nasal drip were also pertinent to this visit.   HPI Marvin Farley presents for PND/sore throat.  Chief Complaint  Patient presents with   Cough    drainage   History of COVID in Jan 2022.  Chronic cough , worse for the last 2.5 weeks,  sore throat comes and goes. perles not helping .  Lots of PND.  No wheezing ,  no body aches or fevers. Has woken up 3 times feeling warm but has not sweated  through pajamas and has not checked temp.  No sinus congestion . Not taking anything otc.    Has seasonal allergies,  untreated except for Nettie Pott. Works maintenance for [employer].   Treated in ER 3 weeks ago for gastroenteritis ,  had a 1.5 day history of gastroenteritis that resolved by time of exam     Outpatient Medications Prior to Visit  Medication Sig Dispense Refill   Ascorbic Acid (VITAMIN C) 500 MG CAPS Take by mouth 2 (two) times daily.     brimonidine (ALPHAGAN) 0.2 % ophthalmic solution Place into the right eye.     cholecalciferol (VITAMIN D3) 25 MCG (1000 UT) tablet Take 1,000 Units by mouth daily.     Multiple Vitamin (MULTIVITAMIN) capsule Take 1 capsule by mouth daily.     silodosin (RAPAFLO) 4 MG CAPS capsule Take 1 capsule (4 mg total) by mouth daily with breakfast. 90 capsule 1   No facility-administered medications prior to visit.    Review of Systems;  Patient denies headache, fevers, malaise, unintentional weight loss, skin rash, eye pain, sinus congestion and sinus pain, , dysphagia,  hemoptysis ,, dyspnea, wheezing, chest pain, palpitations, orthopnea, edema, abdominal pain, nausea, melena, diarrhea, constipation, flank pain, dysuria, hematuria, urinary  Frequency, nocturia, numbness, tingling, seizures,  Focal weakness, Loss of consciousness,  Tremor, insomnia, depression,  anxiety, and suicidal ideation.      Objective:  BP 106/76 (BP Location: Left Arm, Patient Position: Sitting, Cuff Size: Normal)   Pulse 95   Temp 98.6 F (37 C) (Oral)   Ht '5\' 5"'$  (1.651 m)   Wt 144 lb 9.6 oz (65.6 kg)   SpO2 97%   BMI 24.06 kg/m   BP Readings from Last 3 Encounters:  02/09/22 106/76  01/21/22 129/74  12/30/21 124/74    Wt Readings from Last 3 Encounters:  02/09/22 144 lb 9.6 oz (65.6 kg)  01/21/22 148 lb (67.1 kg)  12/30/21 143 lb 3.2 oz (65 kg)    General appearance: alert, cooperative and appears stated age Ears: normal TM's and external ear canals both ears Throat: lips, mucosa, and tongue normal; teeth and gums normal Neck: no adenopathy, no carotid bruit, supple, symmetrical, trachea midline and thyroid not enlarged, symmetric, no tenderness/mass/nodules Back: symmetric, no curvature. ROM normal. No CVA tenderness. Lungs: clear to auscultation bilaterally Heart: regular rate and rhythm, S1, S2 normal, no murmur, click, rub or gallop Abdomen: soft, non-tender; bowel sounds normal; no masses,  no organomegaly Pulses: 2+ and symmetric Skin: Skin color, texture, turgor normal. No rashes or lesions Lymph nodes: Cervical, supraclavicular, and axillary nodes normal.  No results found for: HGBA1C  Lab Results  Component Value Date   CREATININE 1.09 01/21/2022   CREATININE 1.03 12/28/2021   CREATININE 1.02 07/12/2021    Lab Results  Component Value Date   WBC 8.7 01/21/2022   HGB 15.9 01/21/2022   HCT 49.5 01/21/2022   PLT 229 01/21/2022   GLUCOSE 125 (H) 01/21/2022   CHOL 162 12/28/2021   TRIG 87.0 12/28/2021   HDL 53.20 12/28/2021   LDLCALC 91 12/28/2021   ALT 9 01/21/2022   AST 21 01/21/2022   NA 138 01/21/2022   K 4.4 01/21/2022   CL 104 01/21/2022   CREATININE 1.09 01/21/2022   BUN 23 01/21/2022   CO2 23 01/21/2022   TSH 1.25 07/12/2021   PSA 6.18 (H) 12/26/2019    No results found.  Assessment & Plan:   Problem List Items  Addressed This Visit     RESOLVED: Bronchitis   Cough - Primary   Relevant Medications   benzonatate (TESSALON) 100 MG capsule   Other Relevant Orders   POC COVID-19 (Completed)   Post-nasal drip    WITH COUGH.  No pulmonary symptoms and exam normal.  Recommend use of benadryl and allegra   Continue tessalon perles prn . COVID TEST WAS NEGATIVE         Follow-up: No follow-ups on file.   Crecencio Mc, MD

## 2022-02-09 NOTE — Patient Instructions (Signed)
I want you to try using generic Benadryl 25 mg at bedtime , and generic Allegra every morning  This combination will treat allergic rhinitis and post nasal drip  Continue using the tessalon perles as needed.  I have sent a refill

## 2022-02-15 DIAGNOSIS — H40013 Open angle with borderline findings, low risk, bilateral: Secondary | ICD-10-CM | POA: Diagnosis not present

## 2022-02-15 DIAGNOSIS — H35353 Cystoid macular degeneration, bilateral: Secondary | ICD-10-CM | POA: Diagnosis not present

## 2022-02-15 DIAGNOSIS — Z961 Presence of intraocular lens: Secondary | ICD-10-CM | POA: Diagnosis not present

## 2022-02-15 DIAGNOSIS — Z0100 Encounter for examination of eyes and vision without abnormal findings: Secondary | ICD-10-CM | POA: Diagnosis not present

## 2022-04-12 ENCOUNTER — Other Ambulatory Visit: Payer: Self-pay | Admitting: Internal Medicine

## 2022-04-28 ENCOUNTER — Other Ambulatory Visit (INDEPENDENT_AMBULATORY_CARE_PROVIDER_SITE_OTHER): Payer: Medicare HMO

## 2022-04-28 DIAGNOSIS — E871 Hypo-osmolality and hyponatremia: Secondary | ICD-10-CM | POA: Diagnosis not present

## 2022-04-28 DIAGNOSIS — Z13220 Encounter for screening for lipoid disorders: Secondary | ICD-10-CM | POA: Diagnosis not present

## 2022-04-28 LAB — LIPID PANEL
Cholesterol: 157 mg/dL (ref 0–200)
HDL: 56.3 mg/dL (ref >39.00)
LDL Cholesterol: 90 mg/dL (ref 0–99)
NonHDL: 100.9
Total CHOL/HDL Ratio: 3
Triglycerides: 56 mg/dL (ref 0.0–149.0)
VLDL: 11.2 mg/dL (ref 0.0–40.0)

## 2022-04-28 LAB — CBC WITH DIFFERENTIAL/PLATELET
Basophils Absolute: 0 10*3/uL (ref 0.0–0.1)
Basophils Relative: 1.1 % (ref 0.0–3.0)
Eosinophils Absolute: 0.3 10*3/uL (ref 0.0–0.7)
Eosinophils Relative: 6 % — ABNORMAL HIGH (ref 0.0–5.0)
HCT: 43.4 % (ref 39.0–52.0)
Hemoglobin: 14.2 g/dL (ref 13.0–17.0)
Lymphocytes Relative: 32.1 % (ref 12.0–46.0)
Lymphs Abs: 1.4 10*3/uL (ref 0.7–4.0)
MCHC: 32.8 g/dL (ref 30.0–36.0)
MCV: 89.6 fl (ref 78.0–100.0)
Monocytes Absolute: 0.4 10*3/uL (ref 0.1–1.0)
Monocytes Relative: 8.4 % (ref 3.0–12.0)
Neutro Abs: 2.3 10*3/uL (ref 1.4–7.7)
Neutrophils Relative %: 52.4 % (ref 43.0–77.0)
Platelets: 187 10*3/uL (ref 150.0–400.0)
RBC: 4.84 Mil/uL (ref 4.22–5.81)
RDW: 15.5 % (ref 11.5–15.5)
WBC: 4.3 10*3/uL (ref 4.0–10.5)

## 2022-04-28 LAB — BASIC METABOLIC PANEL
BUN: 19 mg/dL (ref 6–23)
CO2: 28 mEq/L (ref 19–32)
Calcium: 8.8 mg/dL (ref 8.4–10.5)
Chloride: 102 mEq/L (ref 96–112)
Creatinine, Ser: 1.05 mg/dL (ref 0.40–1.50)
GFR: 63.35 mL/min (ref >60.00)
Glucose, Bld: 88 mg/dL (ref 70–99)
Potassium: 4.8 mEq/L (ref 3.5–5.1)
Sodium: 137 mEq/L (ref 135–145)

## 2022-04-28 LAB — HEPATIC FUNCTION PANEL
ALT: 6 U/L (ref 0–53)
AST: 19 U/L (ref 0–37)
Albumin: 4.1 g/dL (ref 3.5–5.2)
Alkaline Phosphatase: 69 U/L (ref 39–117)
Bilirubin, Direct: 0.2 mg/dL (ref 0.0–0.3)
Total Bilirubin: 0.9 mg/dL (ref 0.2–1.2)
Total Protein: 6.7 g/dL (ref 6.0–8.3)

## 2022-05-03 ENCOUNTER — Ambulatory Visit (INDEPENDENT_AMBULATORY_CARE_PROVIDER_SITE_OTHER): Payer: Medicare HMO | Admitting: Internal Medicine

## 2022-05-03 ENCOUNTER — Encounter: Payer: Self-pay | Admitting: Internal Medicine

## 2022-05-03 DIAGNOSIS — R42 Dizziness and giddiness: Secondary | ICD-10-CM

## 2022-05-03 DIAGNOSIS — R972 Elevated prostate specific antigen [PSA]: Secondary | ICD-10-CM | POA: Diagnosis not present

## 2022-05-03 DIAGNOSIS — R69 Illness, unspecified: Secondary | ICD-10-CM | POA: Diagnosis not present

## 2022-05-03 DIAGNOSIS — I350 Nonrheumatic aortic (valve) stenosis: Secondary | ICD-10-CM

## 2022-05-03 DIAGNOSIS — N400 Enlarged prostate without lower urinary tract symptoms: Secondary | ICD-10-CM | POA: Diagnosis not present

## 2022-05-03 DIAGNOSIS — R239 Unspecified skin changes: Secondary | ICD-10-CM | POA: Diagnosis not present

## 2022-05-03 DIAGNOSIS — F439 Reaction to severe stress, unspecified: Secondary | ICD-10-CM

## 2022-05-03 DIAGNOSIS — R059 Cough, unspecified: Secondary | ICD-10-CM | POA: Diagnosis not present

## 2022-05-03 DIAGNOSIS — L989 Disorder of the skin and subcutaneous tissue, unspecified: Secondary | ICD-10-CM

## 2022-05-03 MED ORDER — MUPIROCIN 2 % EX OINT: 1.0000 | TOPICAL_OINTMENT | Freq: Two times a day (BID) | CUTANEOUS | 0 refills | Status: DC

## 2022-05-03 NOTE — Progress Notes (Signed)
Patient ID: Marvin Farley, male   DOB: [birth year], 88 y.o.   MRN: [id number]   Subjective:    Patient ID: Marvin Farley, male    DOB: March 28, 1932, 88 y.o.   MRN: [id number]   Patient here for  Chief Complaint  Patient presents with   Annual Exam   .   HPI Reports he is doing relatively well.  Stays active.  Working.  No chest pain or sob reported.  No abdominal pain.  Discussed benefiber for his bowels.  Does have skin changes - rough skin - sun exposed skin.  Request referral to dermatology.  Also, right knee lesion - crawling on his knees.  No increased pain or swelling.     Past Medical History:  Diagnosis Date   BPH (benign prostatic hypertrophy)    Kidney stones    Left wrist fracture 1984   Osteoporosis    Right wrist fracture 1996   Past Surgical History:  Procedure Laterality Date   HERNIA REPAIR Bilateral    inguinal   TONSILLECTOMY     Family History  Problem Relation Age of Onset   Cancer Mother        leukemia   Heart disease Father        heart attack   Cancer Brother        Lymphoma   Social History   Socioeconomic History   Marital status: Married    Spouse name: Not on file   Number of children: Not on file   Years of education: Not on file   Highest education level: Not on file  Occupational History   Not on file  Tobacco Use   Smoking status: Never   Smokeless tobacco: Never  Vaping Use   Vaping Use: Never used  Substance and Sexual Activity   Alcohol use: No    Alcohol/week: 0.0 standard drinks of alcohol   Drug use: No   Sexual activity: Not on file  Other Topics Concern   Not on file  Social History Narrative   Not on file   Social Determinants of Health   Financial Resource Strain: Low Risk  (08/27/2021)   Overall Financial Resource Strain (CARDIA)    Difficulty of Paying Living Expenses: Not hard at all  Food Insecurity: No Food Insecurity (08/27/2021)   Hunger Vital Sign    Worried About Running Out of Food in the Last  Year: Never true    [place] in the Last Year: Never true  Transportation Needs: No Transportation Needs (08/27/2021)   PRAPARE - Hydrologist (Medical): No    Lack of Transportation (Non-Medical): No  Physical Activity: Insufficiently Active (08/27/2021)   Exercise Vital Sign    Days of Exercise per Week: 4 days    Minutes of Exercise per Session: 30 min  Stress: No Stress Concern Present (08/27/2021)   [place]    Feeling of Stress : Not at all  Social Connections: Unknown (08/27/2021)   Social Connection and Isolation Panel [NHANES]    Frequency of Communication with Friends and Family: Not on file    Frequency of Social Gatherings with Friends and Family: Not on file    Attends Religious Services: Not on file    Active Member of Clubs or Organizations: Not on file    Attends Archivist Meetings: Not on file    Marital Status: Married     Review  of Systems  Constitutional:  Negative for appetite change and unexpected weight change.  HENT:  Negative for congestion, sinus pressure and sore throat.   Eyes:  Negative for pain and visual disturbance.  Respiratory:  Negative for cough, chest tightness and shortness of breath.   Cardiovascular:  Negative for chest pain and palpitations.  Gastrointestinal:  Negative for abdominal pain, diarrhea, nausea and vomiting.  Genitourinary:  Negative for difficulty urinating and dysuria.  Musculoskeletal:  Negative for joint swelling and myalgias.  Skin:  Negative for color change and rash.  Neurological:  Negative for dizziness, light-headedness and headaches.  Hematological:  Negative for adenopathy. Does not bruise/bleed easily.  Psychiatric/Behavioral:  Negative for agitation and dysphoric mood.        Objective:     BP 110/80 (BP Location: Left Arm, Patient Position: Sitting, Cuff Size: Normal)   Pulse 76   Temp 98 F  (36.7 C) (Oral)   Ht '5\' 5"'$  (1.651 m)   Wt 144 lb 9.6 oz (65.6 kg)   SpO2 97%   BMI 24.06 kg/m  Wt Readings from Last 3 Encounters:  05/03/22 144 lb 9.6 oz (65.6 kg)  02/09/22 144 lb 9.6 oz (65.6 kg)  01/21/22 148 lb (67.1 kg)    Physical Exam Constitutional:      General: He is not in acute distress.    Appearance: Normal appearance. He is well-developed.  HENT:     Head: Normocephalic and atraumatic.     Right Ear: External ear normal.     Left Ear: External ear normal.  Eyes:     General: No scleral icterus.       Right eye: No discharge.        Left eye: No discharge.     Conjunctiva/sclera: Conjunctivae normal.  Neck:     Thyroid: No thyromegaly.  Cardiovascular:     Rate and Rhythm: Normal rate and regular rhythm.     Comments: 3/6 systolic murmur Pulmonary:     Effort: No respiratory distress.     Breath sounds: Normal breath sounds. No wheezing.  Abdominal:     General: Bowel sounds are normal.     Palpations: Abdomen is soft.     Tenderness: There is no abdominal tenderness.  Musculoskeletal:        General: No swelling or tenderness.     Cervical back: Neck supple. No tenderness.  Lymphadenopathy:     Cervical: No cervical adenopathy.  Skin:    Findings: No erythema or rash.  Neurological:     Mental Status: He is alert and oriented to person, place, and time.  Psychiatric:        Mood and Affect: Mood normal.        Behavior: Behavior normal.      Outpatient Encounter Medications as of 05/03/2022  Medication Sig   Ascorbic Acid (VITAMIN C) 500 MG CAPS Take by mouth 2 (two) times daily.   benzonatate (TESSALON) 100 MG capsule Take 1 capsule (100 mg total) by mouth 2 (two) times daily as needed for cough.   brimonidine (ALPHAGAN) 0.2 % ophthalmic solution Place into the right eye.   cholecalciferol (VITAMIN D3) 25 MCG (1000 UT) tablet Take 1,000 Units by mouth daily.   Multiple Vitamin (MULTIVITAMIN) capsule Take 1 capsule by mouth daily.    mupirocin ointment (BACTROBAN) 2 % Apply 1 Application topically 2 (two) times daily.   silodosin (RAPAFLO) 4 MG CAPS capsule TAKE 1 CAPSULE BY MOUTH ONCE DAILY WITH BREAKFAST  No facility-administered encounter medications on file as of 05/03/2022.     Lab Results  Component Value Date   WBC 4.3 04/28/2022   HGB 14.2 04/28/2022   HCT 43.4 04/28/2022   PLT 187.0 04/28/2022   GLUCOSE 88 04/28/2022   CHOL 157 04/28/2022   TRIG 56.0 04/28/2022   HDL 56.30 04/28/2022   LDLCALC 90 04/28/2022   ALT 6 04/28/2022   AST 19 04/28/2022   NA 137 04/28/2022   K 4.8 04/28/2022   CL 102 04/28/2022   CREATININE 1.05 04/28/2022   BUN 19 04/28/2022   CO2 28 04/28/2022   TSH 1.25 07/12/2021   PSA 6.18 (H) 12/26/2019       Assessment & Plan:   Problem List Items Addressed This Visit     Aortic stenosis    Moderate AS noted on echo 08/2021. Seeing cardiology.  Elected to follow.  Is still working.  No increased symptoms.  Follow.       Relevant Orders   Hepatic function panel   Basic metabolic panel   Lipid panel   TSH   BPH with elevated PSA    Continue rapaflo.       Cough    Resolved.       Dizziness    Not a significant issue for him.  Has seen cardiology for f/u AS.  Feels symptoms stable.  Discussed importance of staying hydrated and slow position changes and movements.  Follow.       Skin change    Rough, thickened skin - sun exposed areas.  Request referral to dermatology.       Relevant Orders   Ambulatory referral to Dermatology   Skin lesion    Skin lesion - knee - noticed after crawling on ground.  bactroban topically.  Follow. Notify me if persistent.       Stress    Overall he feels he is handling things well.  Follow.         Einar Pheasant, MD

## 2022-05-06 ENCOUNTER — Telehealth: Payer: Self-pay

## 2022-05-06 NOTE — Telephone Encounter (Signed)
If you have provider preference I am happy to place referral for patient & let him know that he should expect a call usually in around 1-2 weeks.

## 2022-05-06 NOTE — Telephone Encounter (Signed)
Patient states he has not heard from anyone regarding his dermatology referral.  I let patient know that I do not see a referral in his chart yet, so I will send a message back to Dr. Einar Pheasant.

## 2022-05-08 ENCOUNTER — Encounter: Payer: Self-pay | Admitting: Internal Medicine

## 2022-05-08 DIAGNOSIS — R239 Unspecified skin changes: Secondary | ICD-10-CM | POA: Insufficient documentation

## 2022-05-08 NOTE — Assessment & Plan Note (Signed)
Skin lesion - knee - noticed after crawling on ground.  bactroban topically.  Follow. Notify me if persistent.

## 2022-05-08 NOTE — Assessment & Plan Note (Signed)
Overall he feels he is handling things well.  Follow.  

## 2022-05-08 NOTE — Assessment & Plan Note (Signed)
-

## 2022-05-08 NOTE — Assessment & Plan Note (Signed)
Rough, thickened skin - sun exposed areas.  Request referral to dermatology.

## 2022-05-08 NOTE — Assessment & Plan Note (Signed)
Not a significant issue for him.  Has seen cardiology for f/u AS.  Feels symptoms stable.  Discussed importance of staying hydrated and slow position changes and movements.  Follow.

## 2022-05-08 NOTE — Assessment & Plan Note (Addendum)
Moderate AS noted on echo 08/2021. Seeing cardiology.  Elected to follow.  Is still working.  No increased symptoms.  Follow.

## 2022-05-08 NOTE — Telephone Encounter (Signed)
Order has been placed for dermatology referral.

## 2022-05-08 NOTE — Assessment & Plan Note (Signed)
Resolved

## 2022-05-17 ENCOUNTER — Emergency Department
Admission: EM | Admit: 2022-05-17 | Discharge: 2022-05-17 | Discharge status: Against Medical Advice | Payer: Medicare HMO | Attending: Emergency Medicine | Admitting: Emergency Medicine

## 2022-05-17 ENCOUNTER — Other Ambulatory Visit: Payer: Self-pay

## 2022-05-17 DIAGNOSIS — T7840XA Allergy, unspecified, initial encounter: Secondary | ICD-10-CM | POA: Diagnosis not present

## 2022-05-17 DIAGNOSIS — R464 Slowness and poor responsiveness: Secondary | ICD-10-CM | POA: Diagnosis not present

## 2022-05-17 DIAGNOSIS — T63441A Toxic effect of venom of bees, accidental (unintentional), initial encounter: Secondary | ICD-10-CM | POA: Insufficient documentation

## 2022-05-17 DIAGNOSIS — R55 Syncope and collapse: Secondary | ICD-10-CM | POA: Diagnosis not present

## 2022-05-17 DIAGNOSIS — T782XXA Anaphylactic shock, unspecified, initial encounter: Secondary | ICD-10-CM | POA: Diagnosis not present

## 2022-05-17 DIAGNOSIS — R9431 Abnormal electrocardiogram [ECG] [EKG]: Secondary | ICD-10-CM | POA: Diagnosis not present

## 2022-05-17 DIAGNOSIS — J80 Acute respiratory distress syndrome: Secondary | ICD-10-CM | POA: Diagnosis not present

## 2022-05-17 DIAGNOSIS — R42 Dizziness and giddiness: Secondary | ICD-10-CM | POA: Insufficient documentation

## 2022-05-17 DIAGNOSIS — Z743 Need for continuous supervision: Secondary | ICD-10-CM | POA: Diagnosis not present

## 2022-05-17 MED ORDER — EPINEPHRINE 0.3 MG/0.3ML IJ SOAJ: 0.3000 mg | INTRAMUSCULAR | 0 refills | Status: AC | PRN

## 2022-05-17 NOTE — Discharge Instructions (Addendum)
Please use Benadryl 25-50 mg every 8 hours for the next 3 days

## 2022-05-17 NOTE — ED Provider Notes (Signed)
[facility] Provider Note   Event Date/Time   First MD Initiated Contact with Patient 05/17/22 1255     (approximate) History  Allergic Reaction  HPI KIRSTEN MCKONE is a 88 y.o. male with past medical history of BPH who presents via EMS after being stung by yellow jackets and losing consciousness.  Patient states that initially after these bites he was feeling fine however he began feeling lightheaded approximately 10 minutes after being stung and decided to sit down inside.  By the time of EMS arrival, patient was "unresponsive" however he was breathing on his own and had a normal pulse per EMS.  Patient was then given 0.3 mg intramuscular epinephrine with resolution of his symptoms and returned to baseline.  Patient denies any complaints at this time ROS: Patient currently denies any vision changes, tinnitus, difficulty speaking, facial droop, sore throat, chest pain, shortness of breath, abdominal pain, nausea/vomiting/diarrhea, dysuria, or weakness/numbness/paresthesias in any extremity   Physical Exam  Triage Vital Signs: ED Triage Vitals  Enc Vitals Group     BP 05/17/22 1257 121/74     Pulse Rate 05/17/22 1257 81     Resp 05/17/22 1257 16     Temp 05/17/22 1257 (!) 97.5 F (36.4 C)     Temp Source 05/17/22 1257 Oral     SpO2 05/17/22 1257 99 %     Weight 05/17/22 1302 145 lb (65.8 kg)     Height 05/17/22 1302 '5\' 6"'$  (1.676 m)     Head Circumference --      Peak Flow --      Pain Score 05/17/22 1302 0     Pain Loc --      Pain Edu? --      Excl. in [place]? --    Most recent vital signs: Vitals:   05/17/22 1257  BP: 121/74  Pulse: 81  Resp: 16  Temp: (!) 97.5 F (36.4 C)  SpO2: 99%   General: Awake, oriented x4. CV:  Good peripheral perfusion.  Resp:  Normal effort.  Abd:  No distention.  Other:  Elderly Caucasian male laying in bed in no acute distress. ED Results / Procedures / Treatments  PROCEDURES: Critical Care performed: No .1-3  Lead EKG Interpretation  Performed by: Naaman Plummer, MD Authorized by: Naaman Plummer, MD     Interpretation: normal     ECG rate:  82   ECG rate assessment: normal     Rhythm: sinus rhythm     Ectopy: none     Conduction: normal    MEDICATIONS ORDERED IN ED: Medications - No data to display IMPRESSION / MDM / [place] / ED COURSE  I reviewed the triage vital signs and the nursing notes.                             The patient is on the cardiac monitor to evaluate for evidence of arrhythmia and/or significant heart rate changes. Patient's presentation is most consistent with acute presentation with potential threat to life or bodily function. + Anaphylaxis  Given history and exam, presentation most consistent with anaphylaxis. I have low suspicion for toxic shock syndrome, asthma exacerbation, or drug toxicity.  Patient has remained stable throughout my shift however patient will require 2 more hours of monitoring for recurrence of symptoms after epinephrine injection.  Care of this patient will be signed out to the oncoming physician at  the end of my shift.  All pertinent patient information conveyed and all questions answered.  All further care and disposition decisions will be made by the oncoming physician.   FINAL CLINICAL IMPRESSION(S) / ED DIAGNOSES   Final diagnoses:  Anaphylaxis, initial encounter  Anaphylaxis due to hymenoptera venom, accidental or unintentional, initial encounter   Rx / DC Orders   ED Discharge Orders     None      Note:  This document was prepared using Dragon voice recognition software and may include unintentional dictation errors.   Naaman Plummer, MD 05/17/22 1525

## 2022-05-17 NOTE — ED Triage Notes (Signed)
Pt was working outside when he was stung multiple times by yellow jackets, pt does not carry an epi pen but was given 0.3 by fire. EMS administered solumedrol as well. Pt was unresponsive prior to arrival to ED. Pt is A&O x4 at this time.

## 2022-05-23 ENCOUNTER — Encounter: Payer: Self-pay | Admitting: *Deleted

## 2022-07-18 ENCOUNTER — Other Ambulatory Visit: Payer: Self-pay | Admitting: Family

## 2022-07-19 ENCOUNTER — Telehealth: Payer: Self-pay | Admitting: Internal Medicine

## 2022-07-19 NOTE — Telephone Encounter (Signed)
Pt called in stating he is dizzy when he wakes up. sent to access nurse

## 2022-07-19 NOTE — Telephone Encounter (Signed)
L/M FOR PT. TO C/B.

## 2022-07-21 ENCOUNTER — Ambulatory Visit (INDEPENDENT_AMBULATORY_CARE_PROVIDER_SITE_OTHER): Payer: Medicare HMO | Admitting: Family Medicine

## 2022-07-21 ENCOUNTER — Encounter: Payer: Self-pay | Admitting: Family Medicine

## 2022-07-21 VITALS — BP 110/60 | HR 73 | Temp 98.3°F | Resp 14 | Ht 66.0 in | Wt 144.2 lb

## 2022-07-21 DIAGNOSIS — R42 Dizziness and giddiness: Secondary | ICD-10-CM

## 2022-07-21 DIAGNOSIS — I350 Nonrheumatic aortic (valve) stenosis: Secondary | ICD-10-CM

## 2022-07-21 LAB — COMPREHENSIVE METABOLIC PANEL
ALT: 7 U/L (ref 0–53)
AST: 22 U/L (ref 0–37)
Albumin: 4.1 g/dL (ref 3.5–5.2)
Alkaline Phosphatase: 75 U/L (ref 39–117)
BUN: 18 mg/dL (ref 6–23)
CO2: 29 mEq/L (ref 19–32)
Calcium: 8.9 mg/dL (ref 8.4–10.5)
Chloride: 102 mEq/L (ref 96–112)
Creatinine, Ser: 1.04 mg/dL (ref 0.40–1.50)
GFR: 63.98 mL/min (ref >60.00)
Glucose, Bld: 83 mg/dL (ref 70–99)
Potassium: 4.4 mEq/L (ref 3.5–5.1)
Sodium: 136 mEq/L (ref 135–145)
Total Bilirubin: 0.3 mg/dL (ref 0.2–1.2)
Total Protein: 6.6 g/dL (ref 6.0–8.3)

## 2022-07-21 LAB — CBC WITH DIFFERENTIAL/PLATELET
Basophils Absolute: 0 10*3/uL (ref 0.0–0.1)
Basophils Relative: 0.7 % (ref 0.0–3.0)
Eosinophils Absolute: 0.2 10*3/uL (ref 0.0–0.7)
Eosinophils Relative: 5.2 % — ABNORMAL HIGH (ref 0.0–5.0)
HCT: 42.6 % (ref 39.0–52.0)
Hemoglobin: 14.4 g/dL (ref 13.0–17.0)
Lymphocytes Relative: 39.9 % (ref 12.0–46.0)
Lymphs Abs: 1.7 10*3/uL (ref 0.7–4.0)
MCHC: 33.8 g/dL (ref 30.0–36.0)
MCV: 89.3 fl (ref 78.0–100.0)
Monocytes Absolute: 0.5 10*3/uL (ref 0.1–1.0)
Monocytes Relative: 11.1 % (ref 3.0–12.0)
Neutro Abs: 1.9 10*3/uL (ref 1.4–7.7)
Neutrophils Relative %: 43.1 % (ref 43.0–77.0)
Platelets: 216 10*3/uL (ref 150.0–400.0)
RBC: 4.77 Mil/uL (ref 4.22–5.81)
RDW: 14.3 % (ref 11.5–15.5)
WBC: 4.4 10*3/uL (ref 4.0–10.5)

## 2022-07-21 LAB — MAGNESIUM: Magnesium: 2 mg/dL (ref 1.5–2.5)

## 2022-07-21 NOTE — Assessment & Plan Note (Addendum)
Not symptomatic today.  No focal findings on neurological exam.  3/6 systolic murmur.  Orthostatic vitals normal. Reviewed recent CT head from 05/2022 ECG SR with frequent PAC's.  Suspect cardiac etiology as of dizziness given similar symptoms in past. -Increase hydration -Cmet, CBC today -ECG today -Refer to Cardiology  -Strict return precautions provided

## 2022-07-21 NOTE — Patient Instructions (Signed)
It was a pleasure meeting you today. Thank you for allowing me to take part in your health care.  Our goals for today as we discussed include:  For your dizziness Drink plenty of water to stay hydrated Will check some lab work today Will send referral to Cardiology Please call the clinic in tomorrow to notify MD if any worsening.   If you have any worsening dizziness, chest pain or shortness of breath call 911 or have someone take you to the Emergency department  Please follow-up with PCP as scheduled or sooner if needed  If you have any questions or concerns, please do not hesitate to call the office at [phone].  I look forward to our next visit and until then take care and stay safe.  Regards,   Carollee Leitz, MD   [facility]

## 2022-07-21 NOTE — Progress Notes (Signed)
    SUBJECTIVE:   CHIEF COMPLAINT / HPI: Dizziness  Patient presents to clinic with concern for dizziness. Symptoms started 3 to 4 weeks after episode of anaphylaxis, stung by yellow jackets. Dizziness worse from laying to standing and with bending at waist. Denies any SOB, chest pain, heart palpitations, visual changes, headaches, head trauma, slurred speech, numbness, tingling or weakness.  Reports had similar symptoms in past.  Evaluated by cardiology but had not followed up. Patient endorses poor po intake after receiving epinephrine back 09/23.  Not currently having any dizziness today.    PERTINENT  PMH / PSH:  Aortic Stenosis  OBJECTIVE:   BP 110/60 (BP Location: Left Arm, Patient Position: Sitting, Cuff Size: Normal)   Pulse 73   Temp 98.3 F (36.8 C) (Oral)   Resp 14   Ht '5\' 6"'$  (1.676 m)   Wt 144 lb 3.2 oz (65.4 kg)   SpO2 97%   BMI 23.27 kg/m    General: Alert, no acute distress Cardio: 3/6 systolic murmur heard at RUS border,  irregular rhythm Pulm: CTAB, normal work of breathing Abdomen: Bowel sounds normal. Abdomen soft and non-tender.  Extremities: No peripheral edema.  Neuro: CN II: PERRL CN III, IV,VI: EOMI CV V: Normal sensation in V1, V2, V3 CVII: Symmetric smile and brow raise CN VIII: Normal hearing CN IX,X: Symmetric palate raise  CN XI: 5/5 shoulder shrug CN XII: Symmetric tongue protrusion  UE and LE strength 5/5 2+ UE and LE reflexes  Normal sensation in UE and LE bilaterally  No ataxia with finger to nose, normal heel to shin  Negative [place]   Orthostatic VS for the past 72 hrs (Last 3 readings):  Orthostatic BP Patient Position BP Location Cuff Size Orthostatic Pulse  07/21/22 1153 118/76 Standing Left Arm Normal 75  07/21/22 1152 95/64 Sitting Left Arm Normal 80  07/21/22 1151 96/61 Lying left side Left Arm Normal 65     ASSESSMENT/PLAN:   Aortic stenosis Not symptomatic today.  No focal findings on neurological exam.  3/6 systolic  murmur.  Orthostatic vitals normal. Reviewed recent CT head from 05/2022 ECG SR with frequent PAC's.  Suspect cardiac etiology as of dizziness given similar symptoms in past. -Increase hydration -Cmet, CBC today -ECG today -Refer to Cardiology  -Strict return precautions provided    PDMP reviewed  Carollee Leitz, MD

## 2022-08-01 ENCOUNTER — Encounter: Payer: Self-pay | Admitting: Cardiology

## 2022-08-01 ENCOUNTER — Ambulatory Visit: Payer: Medicare HMO | Attending: Cardiology | Admitting: Cardiology

## 2022-08-01 VITALS — BP 110/64 | HR 66 | Ht 65.5 in | Wt 146.8 lb

## 2022-08-01 DIAGNOSIS — I350 Nonrheumatic aortic (valve) stenosis: Secondary | ICD-10-CM | POA: Diagnosis not present

## 2022-08-01 DIAGNOSIS — R42 Dizziness and giddiness: Secondary | ICD-10-CM | POA: Diagnosis not present

## 2022-08-01 NOTE — Progress Notes (Signed)
Cardiology Office Note:    Date:  08/01/2022   ID:  Marvin Farley, DOB May 15, 1932, MRN [id number]  PCP:  Einar Pheasant, [place] [facility] Cardiologist:  Kate Sable, MD     Referring MD: Einar Pheasant, MD   Chief Complaint  Patient presents with   New Patient (Initial Visit)    Aortic stenosis, CVD, no Hx     History of Present Illness:    Marvin Farley is a 88 y.o. male with a hx of aortic valve stenosis who presents to establish care due to history of aortic valve disease.  He states feeling well until about 2 months ago when he got stung by yellow jackets.  EMS was called, per report, he was unresponsive but breathing on his own, he received epinephrine and taken to the ED.  Symptoms improved.  States feeling dizzy since then.  Dizziness usually occurs with changing positions.  He denies chest pain, shortness of breath, palpitations, denies any history of heart disease.  Patient had an echocardiogram in 2013 showing EF 55%, moderate aortic stenosis noted.  Past Medical History:  Diagnosis Date   BPH (benign prostatic hypertrophy)    Kidney stones    Left wrist fracture 1984   Osteoporosis    Right wrist fracture 1996    Past Surgical History:  Procedure Laterality Date   HERNIA REPAIR Bilateral    inguinal   TONSILLECTOMY      Current Medications: Current Meds  Medication Sig   Ascorbic Acid (VITAMIN C) 500 MG CAPS Take by mouth 2 (two) times daily.   benzonatate (TESSALON) 100 MG capsule Take 1 capsule (100 mg total) by mouth 2 (two) times daily as needed for cough.   brimonidine (ALPHAGAN) 0.2 % ophthalmic solution Place into the right eye.   cholecalciferol (VITAMIN D3) 25 MCG (1000 UT) tablet Take 1,000 Units by mouth daily.   EPINEPHrine 0.3 mg/0.3 mL IJ SOAJ injection Inject 0.3 mg into the muscle as needed for anaphylaxis.   Multiple Vitamin (MULTIVITAMIN) capsule Take 1 capsule by mouth daily.   mupirocin ointment  (BACTROBAN) 2 % Apply 1 Application topically 2 (two) times daily.   silodosin (RAPAFLO) 4 MG CAPS capsule TAKE 1 CAPSULE BY MOUTH ONCE DAILY WITH BREAKFAST     Allergies:   Bee venom and Codeine sulfate   Social History   Socioeconomic History   Marital status: Married    Spouse name: Not on file   Number of children: Not on file   Years of education: Not on file   Highest education level: Not on file  Occupational History   Not on file  Tobacco Use   Smoking status: Never   Smokeless tobacco: Never  Vaping Use   Vaping Use: Never used  Substance and Sexual Activity   Alcohol use: No    Alcohol/week: 0.0 standard drinks of alcohol   Drug use: No   Sexual activity: Not on file  Other Topics Concern   Not on file  Social History Narrative   Not on file   Social Determinants of Health   Financial Resource Strain: Low Risk  (08/27/2021)   Overall Financial Resource Strain (CARDIA)    Difficulty of Paying Living Expenses: Not hard at all  Food Insecurity: No Food Insecurity (08/27/2021)   Hunger Vital Sign    Worried About Running Out of Food in the Last Year: Never true    Ran Out of Food in the Last Year: Never  true  Transportation Needs: No Transportation Needs (08/27/2021)   PRAPARE - Hydrologist (Medical): No    Lack of Transportation (Non-Medical): No  Physical Activity: Insufficiently Active (08/27/2021)   Exercise Vital Sign    Days of Exercise per Week: 4 days    Minutes of Exercise per Session: 30 min  Stress: No Stress Concern Present (08/27/2021)   [place]    Feeling of Stress : Not at all  Social Connections: Unknown (08/27/2021)   Social Connection and Isolation Panel [NHANES]    Frequency of Communication with Friends and Family: Not on file    Frequency of Social Gatherings with Friends and Family: Not on file    Attends Religious Services: Not on file     Active Member of Clubs or Organizations: Not on file    Attends Archivist Meetings: Not on file    Marital Status: Married     Family History: The patient's family history includes Cancer in his brother and mother; Heart disease in his father.  ROS:   Please see the history of present illness.     All other systems reviewed and are negative.  EKGs/Labs/Other Studies Reviewed:    The following studies were reviewed today:   EKG:  EKG is  ordered today.  The ekg ordered today demonstrates normal sinus rhythm, normal ECG  Recent Labs: 07/21/2022: ALT 7; BUN 18; Creatinine, Ser 1.04; Hemoglobin 14.4; Magnesium 2.0; Platelets 216.0; Potassium 4.4; Sodium 136  Recent Lipid Panel    Component Value Date/Time   CHOL 157 04/28/2022 0756   TRIG 56.0 04/28/2022 0756   HDL 56.30 04/28/2022 0756   CHOLHDL 3 04/28/2022 0756   VLDL 11.2 04/28/2022 0756   LDLCALC 90 04/28/2022 0756     Risk Assessment/Calculations:             Physical Exam:    VS:  BP 110/64 (BP Location: Right Arm)   Pulse 66   Ht 5' 5.5" (1.664 m)   Wt 146 lb 12.8 oz (66.6 kg)   SpO2 98%   BMI 24.06 kg/m     Wt Readings from Last 3 Encounters:  08/01/22 146 lb 12.8 oz (66.6 kg)  07/21/22 144 lb 3.2 oz (65.4 kg)  05/17/22 145 lb (65.8 kg)     GEN:  Well nourished, well developed in no acute distress HEENT: Normal NECK: No JVD; No carotid bruits CARDIAC: RRR, 3/6 systolic murmur RESPIRATORY:  Clear to auscultation without rales, wheezing or rhonchi  ABDOMEN: Soft, non-tender, non-distended MUSCULOSKELETAL:  No edema; No deformity  SKIN: Warm and dry NEUROLOGIC:  Alert and oriented x 3 PSYCHIATRIC:  Normal affect   ASSESSMENT:    1. Aortic valve stenosis, etiology of cardiac valve disease unspecified   2. Dizziness    PLAN:    In order of problems listed above:  Aortic valve stenosis, moderate on echo back in 2013.  Repeat echocardiogram to evaluate any progression of valvular  disease. Dizziness with changing positions, this could be positional vertigo, aortic valve stenosis if severe could be contributing.  Echo as above.  Orthostatic vitals today with no evidence for orthostasis.  Follow-up after echo.      Medication Adjustments/Labs and Tests Ordered: Current medicines are reviewed at length with the patient today.  Concerns regarding medicines are outlined above.  Orders Placed This Encounter  Procedures   EKG 12-Lead   ECHOCARDIOGRAM COMPLETE   No  orders of the defined types were placed in this encounter.   Patient Instructions  Medication Instructions:  Your physician recommends that you continue on your current medications as directed. Please refer to the Current Medication list given to you today.  *If you need a refill on your cardiac medications before your next appointment, please call your pharmacy*    Testing/Procedures:  Your physician has requested that you have an echocardiogram. Echocardiography is a painless test that uses sound waves to create images of your heart. It provides your doctor with information about the size and shape of your heart and how well your heart's chambers and valves are working. This procedure takes approximately one hour. There are no restrictions for this procedure. Please do NOT wear cologne, perfume, aftershave, or lotions (deodorant is allowed). Please arrive 15 minutes prior to your appointment time.    Follow-Up: At [facility], you and your health needs are our priority.  As part of our continuing mission to provide you with exceptional heart care, we have created designated Provider Care Teams.  These Care Teams include your primary Cardiologist (physician) and Advanced Practice Providers (APPs -  Physician Assistants and Nurse Practitioners) who all work together to provide you with the care you need, when you need it.  We recommend signing up for the patient portal called "MyChart".  Sign  up information is provided on this After Visit Summary.  MyChart is used to connect with patients for Virtual Visits (Telemedicine).  Patients are able to view lab/test results, encounter notes, upcoming appointments, etc.  Non-urgent messages can be sent to your provider as well.   To learn more about what you can do with MyChart, go to [email or web address].    Your next appointment:   Follow up after Echo    The format for your next appointment:   In Person  Provider:   Kate Sable, MD    Other Instructions   Important Information About Sugar         Signed, Kate Sable, MD  08/01/2022 10:11 AM    [place]

## 2022-08-01 NOTE — Patient Instructions (Signed)
Medication Instructions:  Your physician recommends that you continue on your current medications as directed. Please refer to the Current Medication list given to you today.  *If you need a refill on your cardiac medications before your next appointment, please call your pharmacy*    Testing/Procedures:  Your physician has requested that you have an echocardiogram. Echocardiography is a painless test that uses sound waves to create images of your heart. It provides your doctor with information about the size and shape of your heart and how well your heart's chambers and valves are working. This procedure takes approximately one hour. There are no restrictions for this procedure. Please do NOT wear cologne, perfume, aftershave, or lotions (deodorant is allowed). Please arrive 15 minutes prior to your appointment time.    Follow-Up: At [facility], you and your health needs are our priority.  As part of our continuing mission to provide you with exceptional heart care, we have created designated Provider Care Teams.  These Care Teams include your primary Cardiologist (physician) and Advanced Practice Providers (APPs -  Physician Assistants and Nurse Practitioners) who all work together to provide you with the care you need, when you need it.  We recommend signing up for the patient portal called "MyChart".  Sign up information is provided on this After Visit Summary.  MyChart is used to connect with patients for Virtual Visits (Telemedicine).  Patients are able to view lab/test results, encounter notes, upcoming appointments, etc.  Non-urgent messages can be sent to your provider as well.   To learn more about what you can do with MyChart, go to [email or web address].    Your next appointment:   Follow up after Echo    The format for your next appointment:   In Person  Provider:   Kate Sable, MD    Other Instructions   Important Information About  Sugar

## 2022-08-02 ENCOUNTER — Ambulatory Visit: Payer: Medicare HMO | Attending: Cardiology

## 2022-08-02 DIAGNOSIS — I350 Nonrheumatic aortic (valve) stenosis: Secondary | ICD-10-CM

## 2022-08-02 LAB — ECHOCARDIOGRAM COMPLETE
AR max vel: 0.58 cm2
AV Area VTI: 0.5 cm2
AV Area mean vel: 0.66 cm2
AV Mean grad: 34 mmHg
AV Peak grad: 53.9 mmHg
Ao pk vel: 3.67 m/s
Area-P 1/2: 4.06 cm2
Calc EF: 58.9 %
P 1/2 time: 456 msec
S' Lateral: 2.7 cm
Single Plane A2C EF: 63.9 %
Single Plane A4C EF: 55.3 %

## 2022-08-05 ENCOUNTER — Telehealth: Payer: Self-pay

## 2022-08-05 NOTE — Telephone Encounter (Signed)
-----   Message from Kate Sable, MD sent at 08/05/2022 11:59 AM EST ----- Echo shows normal systolic function, severe aortic valve stenosis noted.  Schedule follow-up appointment to discuss possible valve management, patient will need left heart cath and TAVR/SAVR consideration.

## 2022-08-05 NOTE — Telephone Encounter (Signed)
The patient has been notified of the result and verbalized understanding.  All questions (if any) were answered. Kavin Leech, RN 08/05/2022 12:42 PM

## 2022-08-11 ENCOUNTER — Ambulatory Visit: Payer: Medicare HMO | Attending: Cardiology | Admitting: Cardiology

## 2022-08-11 ENCOUNTER — Encounter: Payer: Self-pay | Admitting: Cardiology

## 2022-08-11 VITALS — BP 126/70 | HR 72 | Ht 65.5 in | Wt 146.4 lb

## 2022-08-11 DIAGNOSIS — I350 Nonrheumatic aortic (valve) stenosis: Secondary | ICD-10-CM

## 2022-08-11 NOTE — Patient Instructions (Addendum)
Medication Instructions:   Your physician recommends that you continue on your current medications as directed. Please refer to the Current Medication list given to you today.  *If you need a refill on your cardiac medications before your next appointment, please call your pharmacy*   Lab Work:  None Ordered  If you have labs (blood work) drawn today and your tests are completely normal, you will receive your results only by: [place] (if you have MyChart) OR A paper copy in the mail If you have any lab test that is abnormal or we need to change your treatment, we will call you to review the results.   Testing/Procedures:  None Ordered   Follow-Up: At [facility], you and your health needs are our priority.  As part of our continuing mission to provide you with exceptional heart care, we have created designated Provider Care Teams.  These Care Teams include your primary Cardiologist (physician) and Advanced Practice Providers (APPs -  Physician Assistants and Nurse Practitioners) who all work together to provide you with the care you need, when you need it.  We recommend signing up for the patient portal called "MyChart".  Sign up information is provided on this After Visit Summary.  MyChart is used to connect with patients for Virtual Visits (Telemedicine).  Patients are able to view lab/test results, encounter notes, upcoming appointments, etc.  Non-urgent messages can be sent to your provider as well.   To learn more about what you can do with MyChart, go to [email or web address].    Your next appointment:   3 month(s)  The format for your next appointment:   In Person  Provider:   You may see Kate Sable, MD or one of the following Advanced Practice Providers on your designated Care Team:   Murray Hodgkins, NP Christell Faith, PA-C Cadence Kathlen Mody, PA-C Gerrie Nordmann, NP

## 2022-08-11 NOTE — Progress Notes (Signed)
Cardiology Office Note:    Date:  08/11/2022   ID:  Marvin Farley, DOB May 21, 1932, MRN [id number]  PCP:  Einar Pheasant, [place] [facility] Cardiologist:  Kate Sable, MD     Referring MD: Einar Pheasant, MD   Chief Complaint  Patient presents with   Follow-up    Testing follow up, no new cardiac concerns     History of Present Illness:    Marvin Farley is a 88 y.o. male with a hx of aortic valve stenosis who presents for follow-up.  Previously seen due to history of aortic valve disease, echocardiogram was obtained to evaluate CAD.  He denies chest pain or shortness of breath, has occasional dizziness with changing positions.  Presents for testing results.     Prior notes Patient had an echocardiogram in 2013 showing EF 55%, moderate aortic stenosis noted.  Past Medical History:  Diagnosis Date   BPH (benign prostatic hypertrophy)    Kidney stones    Left wrist fracture 1984   Osteoporosis    Right wrist fracture 1996    Past Surgical History:  Procedure Laterality Date   HERNIA REPAIR Bilateral    inguinal   TONSILLECTOMY      Current Medications: Current Meds  Medication Sig   Ascorbic Acid (VITAMIN C) 500 MG CAPS Take by mouth 2 (two) times daily.   benzonatate (TESSALON) 100 MG capsule Take 1 capsule (100 mg total) by mouth 2 (two) times daily as needed for cough.   brimonidine (ALPHAGAN) 0.2 % ophthalmic solution Place into the right eye.   cholecalciferol (VITAMIN D3) 25 MCG (1000 UT) tablet Take 1,000 Units by mouth daily.   EPINEPHrine 0.3 mg/0.3 mL IJ SOAJ injection Inject 0.3 mg into the muscle as needed for anaphylaxis.   Multiple Vitamin (MULTIVITAMIN) capsule Take 1 capsule by mouth daily.   mupirocin ointment (BACTROBAN) 2 % Apply 1 Application topically 2 (two) times daily.   silodosin (RAPAFLO) 4 MG CAPS capsule TAKE 1 CAPSULE BY MOUTH ONCE DAILY WITH BREAKFAST     Allergies:   Bee venom and Codeine sulfate    Social History   Socioeconomic History   Marital status: Married    Spouse name: Not on file   Number of children: Not on file   Years of education: Not on file   Highest education level: Not on file  Occupational History   Not on file  Tobacco Use   Smoking status: Never   Smokeless tobacco: Never  Vaping Use   Vaping Use: Never used  Substance and Sexual Activity   Alcohol use: No    Alcohol/week: 0.0 standard drinks of alcohol   Drug use: No   Sexual activity: Not on file  Other Topics Concern   Not on file  Social History Narrative   Not on file   Social Determinants of Health   Financial Resource Strain: Low Risk  (08/27/2021)   Overall Financial Resource Strain (CARDIA)    Difficulty of Paying Living Expenses: Not hard at all  Food Insecurity: No Food Insecurity (08/27/2021)   Hunger Vital Sign    Worried About Running Out of Food in the Last Year: Never true    Ran Out of Food in the Last Year: Never true  Transportation Needs: No Transportation Needs (08/27/2021)   PRAPARE - Hydrologist (Medical): No    Lack of Transportation (Non-Medical): No  Physical Activity: Insufficiently Active (08/27/2021)   Exercise  Vital Sign    Days of Exercise per Week: 4 days    Minutes of Exercise per Session: 30 min  Stress: No Stress Concern Present (08/27/2021)   [place]    Feeling of Stress : Not at all  Social Connections: Unknown (08/27/2021)   Social Connection and Isolation Panel [NHANES]    Frequency of Communication with Friends and Family: Not on file    Frequency of Social Gatherings with Friends and Family: Not on file    Attends Religious Services: Not on file    Active Member of Clubs or Organizations: Not on file    Attends Archivist Meetings: Not on file    Marital Status: Married     Family History: The patient's family history includes Cancer  in his brother and mother; Heart disease in his father.  ROS:   Please see the history of present illness.     All other systems reviewed and are negative.  EKGs/Labs/Other Studies Reviewed:    The following studies were reviewed today:   EKG:  EKG is  ordered today.  The ekg ordered today demonstrates normal sinus rhythm, normal ECG  Recent Labs: 07/21/2022: ALT 7; BUN 18; Creatinine, Ser 1.04; Hemoglobin 14.4; Magnesium 2.0; Platelets 216.0; Potassium 4.4; Sodium 136  Recent Lipid Panel    Component Value Date/Time   CHOL 157 04/28/2022 0756   TRIG 56.0 04/28/2022 0756   HDL 56.30 04/28/2022 0756   CHOLHDL 3 04/28/2022 0756   VLDL 11.2 04/28/2022 0756   LDLCALC 90 04/28/2022 0756     Risk Assessment/Calculations:             Physical Exam:    VS:  BP 126/70 (BP Location: Left Arm, Patient Position: Sitting, Cuff Size: Normal)   Pulse 72   Ht 5' 5.5" (1.664 m)   Wt 146 lb 6.4 oz (66.4 kg)   SpO2 96%   BMI 23.99 kg/m     Wt Readings from Last 3 Encounters:  08/11/22 146 lb 6.4 oz (66.4 kg)  08/01/22 146 lb 12.8 oz (66.6 kg)  07/21/22 144 lb 3.2 oz (65.4 kg)     GEN:  Well nourished, well developed in no acute distress HEENT: Normal NECK: No JVD; No carotid bruits CARDIAC: RRR, 3/6 systolic murmur RESPIRATORY:  Clear to auscultation without rales, wheezing or rhonchi  ABDOMEN: Soft, non-tender, non-distended MUSCULOSKELETAL:  No edema; No deformity  SKIN: Warm and dry NEUROLOGIC:  Alert and oriented x 3 PSYCHIATRIC:  Normal affect   ASSESSMENT:    1. Aortic valve stenosis, etiology of cardiac valve disease unspecified    PLAN:    In order of problems listed above:  Aortic valve stenosis, repeat echo 07/2022 showed severe AS (DVI 0.17, AVA 0.5cm2, mean gradient 34 mmHg-likely underestimated, Vmax 3.7 m/s).  refer to structural team for TAVR workup.  EF normal at 55 to 60%.  Right and left heart cath was recommended, but upon scheduling by staff,  patient declined stating not wanting cardiac cath.  Patient states he will not want open heart surgery which I think is both reasonable and appropriate given his age.  Follow-up in 2 to 3 months     Medication Adjustments/Labs and Tests Ordered: Current medicines are reviewed at length with the patient today.  Concerns regarding medicines are outlined above.  No orders of the defined types were placed in this encounter.  No orders of the defined types were placed in  this encounter.   Patient Instructions  Medication Instructions:   Your physician recommends that you continue on your current medications as directed. Please refer to the Current Medication list given to you today.  *If you need a refill on your cardiac medications before your next appointment, please call your pharmacy*   Lab Work:  None Ordered  If you have labs (blood work) drawn today and your tests are completely normal, you will receive your results only by: [place] (if you have MyChart) OR A paper copy in the mail If you have any lab test that is abnormal or we need to change your treatment, we will call you to review the results.   Testing/Procedures:  None Ordered   Follow-Up: At [facility], you and your health needs are our priority.  As part of our continuing mission to provide you with exceptional heart care, we have created designated Provider Care Teams.  These Care Teams include your primary Cardiologist (physician) and Advanced Practice Providers (APPs -  Physician Assistants and Nurse Practitioners) who all work together to provide you with the care you need, when you need it.  We recommend signing up for the patient portal called "MyChart".  Sign up information is provided on this After Visit Summary.  MyChart is used to connect with patients for Virtual Visits (Telemedicine).  Patients are able to view lab/test results, encounter notes, upcoming appointments, etc.  Non-urgent  messages can be sent to your provider as well.   To learn more about what you can do with MyChart, go to [email or web address].    Your next appointment:   3 month(s)  The format for your next appointment:   In Person  Provider:   You may see Kate Sable, MD or one of the following Advanced Practice Providers on your designated Care Team:   Murray Hodgkins, NP Christell Faith, PA-C Cadence Kathlen Mody, PA-C Gerrie Nordmann, NP        Signed, Kate Sable, MD  08/11/2022 12:39 PM    [place]

## 2022-08-16 ENCOUNTER — Telehealth: Payer: Self-pay

## 2022-08-16 NOTE — Telephone Encounter (Signed)
  [facility]   MULTIDISCIPLINARY HEART VALVE TEAM  I contacted the pt in regards to structural heart referral for TAVR evaluation. The pt is adamant that he does not want to schedule TAVR evaluation.  The pt does not want surgery or any testing performed.  I educated the pt about his diagnosis and the trajectory of valvular heart disease.  Per the pt "when the [place] calls me home, I am ready to go."  I will make Dr Garen Lah aware.

## 2022-08-18 ENCOUNTER — Ambulatory Visit: Admit: 2022-08-18 | Payer: Medicare HMO | Admitting: Cardiology

## 2022-08-18 SURGERY — RIGHT/LEFT HEART CATH AND CORONARY ANGIOGRAPHY
Anesthesia: Moderate Sedation | Laterality: Bilateral

## 2022-08-19 ENCOUNTER — Telehealth: Payer: Self-pay | Admitting: Internal Medicine

## 2022-08-19 NOTE — Telephone Encounter (Signed)
Spoke with patient spouse she req to CB to sched AWV at [employer]

## 2022-09-08 ENCOUNTER — Ambulatory Visit (INDEPENDENT_AMBULATORY_CARE_PROVIDER_SITE_OTHER): Payer: Medicare HMO

## 2022-09-08 VITALS — Ht 65.5 in | Wt 146.0 lb

## 2022-09-08 DIAGNOSIS — Z0000 Encounter for general adult medical examination without abnormal findings: Secondary | ICD-10-CM

## 2022-09-08 NOTE — Patient Instructions (Addendum)
Marvin Farley , Thank you for taking time to come for your Medicare Wellness Visit. I appreciate your ongoing commitment to your health goals. Please review the following plan we discussed and let me know if I can assist you in the future.   These are the goals we discussed:     Goals Addressed             This Visit's Progress    Maintain healthy lifestyle       Stay hydrated Stay active Healthy diet       This is a list of the screening recommended for you and due dates:  Health Maintenance  Topic Date Due   DTaP/Tdap/Td vaccine (2 - Td or Tdap) 11/16/2020   COVID-19 Vaccine (3 - 2023-24 season) 09/24/2022*   Flu Shot  12/04/2022*   Zoster (Shingles) Vaccine (1 of 2) 12/08/2022*   Medicare Annual Wellness Visit  09/09/2023   Pneumonia Vaccine  Completed   HPV Vaccine  Aged Out  *Topic was postponed. The date shown is not the original due date.   Conditions/risks identified: none new.  Next appointment: Follow up in one year for your annual wellness visit.   Preventive Care 87 Years and Older, Male  Preventive care refers to lifestyle choices and visits with your health care provider that can promote health and wellness. What does preventive care include? A yearly physical exam. This is also called an annual well check. Dental exams once or twice a year. Routine eye exams. Ask your health care provider how often you should have your eyes checked. Personal lifestyle choices, including: Daily care of your teeth and gums. Regular physical activity. Eating a healthy diet. Avoiding tobacco and drug use. Limiting alcohol use. Practicing safe sex. Taking low doses of aspirin every day. Taking vitamin and mineral supplements as recommended by your health care provider. What happens during an annual well check? The services and screenings done by your health care provider during your annual well check will depend on your age, overall health, lifestyle risk factors, and  family history of disease. Counseling  Your health care provider may ask you questions about your: Alcohol use. Tobacco use. Drug use. Emotional well-being. Home and relationship well-being. Sexual activity. Eating habits. History of falls. Memory and ability to understand (cognition). Work and work Statistician. Screening  You may have the following tests or measurements: Height, weight, and BMI. Blood pressure. Lipid and cholesterol levels. These may be checked every 5 years, or more frequently if you are over 48 years old. Skin check. Lung cancer screening. You may have this screening every year starting at age 18 if you have a 30-pack-year history of smoking and currently smoke or have quit within the past 15 years. Fecal occult blood test (FOBT) of the stool. You may have this test every year starting at age 40. Flexible sigmoidoscopy or colonoscopy. You may have a sigmoidoscopy every 5 years or a colonoscopy every 10 years starting at age 87. Prostate cancer screening. Recommendations will vary depending on your family history and other risks. Hepatitis C blood test. Hepatitis B blood test. Sexually transmitted disease (STD) testing. Diabetes screening. This is done by checking your blood sugar (glucose) after you have not eaten for a while (fasting). You may have this done every 1-3 years. Abdominal aortic aneurysm (AAA) screening. You may need this if you are a current or former smoker. Osteoporosis. You may be screened starting at age 17 if you are at high risk. Talk  with your health care provider about your test results, treatment options, and if necessary, the need for more tests. Vaccines  Your health care provider may recommend certain vaccines, such as: Influenza vaccine. This is recommended every year. Tetanus, diphtheria, and acellular pertussis (Tdap, Td) vaccine. You may need a Td booster every 10 years. Zoster vaccine. You may need this after age 54. Pneumococcal  13-valent conjugate (PCV13) vaccine. One dose is recommended after age 31. Pneumococcal polysaccharide (PPSV23) vaccine. One dose is recommended after age 53. Talk to your health care provider about which screenings and vaccines you need and how often you need them. This information is not intended to replace advice given to you by your health care provider. Make sure you discuss any questions you have with your health care provider. Document Released: 09/18/2015 Document Revised: 05/11/2016 Document Reviewed: 06/23/2015 Elsevier Interactive Patient Education  2017 [place] Prevention in the Home Falls can cause injuries. They can happen to people of all ages. There are many things you can do to make your home safe and to help prevent falls. What can I do on the outside of my home? Regularly fix the edges of walkways and driveways and fix any cracks. Remove anything that might make you trip as you walk through a door, such as a raised step or threshold. Trim any bushes or trees on the path to your home. Use bright outdoor lighting. Clear any walking paths of anything that might make someone trip, such as rocks or tools. Regularly check to see if handrails are loose or broken. Make sure that both sides of any steps have handrails. Any raised decks and porches should have guardrails on the edges. Have any leaves, snow, or ice cleared regularly. Use sand or salt on walking paths during winter. Clean up any spills in your garage right away. This includes oil or grease spills. What can I do in the bathroom? Use night lights. Install grab bars by the toilet and in the tub and shower. Do not use towel bars as grab bars. Use non-skid mats or decals in the tub or shower. If you need to sit down in the shower, use a plastic, non-slip stool. Keep the floor dry. Clean up any water that spills on the floor as soon as it happens. Remove soap buildup in the tub or shower regularly. Attach  bath mats securely with double-sided non-slip rug tape. Do not have throw rugs and other things on the floor that can make you trip. What can I do in the bedroom? Use night lights. Make sure that you have a light by your bed that is easy to reach. Do not use any sheets or blankets that are too big for your bed. They should not hang down onto the floor. Have a firm chair that has side arms. You can use this for support while you get dressed. Do not have throw rugs and other things on the floor that can make you trip. What can I do in the kitchen? Clean up any spills right away. Avoid walking on wet floors. Keep items that you use a lot in easy-to-reach places. If you need to reach something above you, use a strong step stool that has a grab bar. Keep electrical cords out of the way. Do not use floor polish or wax that makes floors slippery. If you must use wax, use non-skid floor wax. Do not have throw rugs and other things on the floor that can make you  trip. What can I do with my stairs? Do not leave any items on the stairs. Make sure that there are handrails on both sides of the stairs and use them. Fix handrails that are broken or loose. Make sure that handrails are as long as the stairways. Check any carpeting to make sure that it is firmly attached to the stairs. Fix any carpet that is loose or worn. Avoid having throw rugs at the top or bottom of the stairs. If you do have throw rugs, attach them to the floor with carpet tape. Make sure that you have a light switch at the top of the stairs and the bottom of the stairs. If you do not have them, ask someone to add them for you. What else can I do to help prevent falls? Wear shoes that: Do not have high heels. Have rubber bottoms. Are comfortable and fit you well. Are closed at the toe. Do not wear sandals. If you use a stepladder: Make sure that it is fully opened. Do not climb a closed stepladder. Make sure that both sides of the  stepladder are locked into place. Ask someone to hold it for you, if possible. Clearly mark and make sure that you can see: Any grab bars or handrails. First and last steps. Where the edge of each step is. Use tools that help you move around (mobility aids) if they are needed. These include: Canes. Walkers. Scooters. Crutches. Turn on the lights when you go into a dark area. Replace any light bulbs as soon as they burn out. Set up your furniture so you have a clear path. Avoid moving your furniture around. If any of your floors are uneven, fix them. If there are any pets around you, be aware of where they are. Review your medicines with your doctor. Some medicines can make you feel dizzy. This can increase your chance of falling. Ask your doctor what other things that you can do to help prevent falls. This information is not intended to replace advice given to you by your health care provider. Make sure you discuss any questions you have with your health care provider. Document Released: 06/18/2009 Document Revised: 01/28/2016 Document Reviewed: 09/26/2014 Elsevier Interactive Patient Education  2017 [employer].

## 2022-09-08 NOTE — Progress Notes (Signed)
Subjective:   Marvin Farley is a 89 y.o. male who presents for Medicare Annual/Subsequent preventive examination.  Review of Systems    No ROS.  Medicare Wellness Virtual Visit.  Visual/audio telehealth visit, UTA vital signs.   See social history for additional risk factors.   Cardiac Risk Factors include: advanced age (>9mn, >>47women);male gender     Objective:    Today's Vitals   09/08/22 1439  Weight: 146 lb (66.2 kg)  Height: 5' 5.5" (1.664 m)   Body mass index is 23.93 kg/m.     09/08/2022    2:41 PM 05/17/2022    1:03 PM 01/21/2022    1:03 PM 11/11/2021    5:20 PM 08/27/2021    9:48 AM 08/26/2020   10:43 AM 06/15/2020   12:21 PM  Advanced Directives  Does Patient Have a Medical Advance Directive? No No Yes No No No No  Type of Advance Directive   Living will      Does patient want to make changes to medical advance directive?   No - Patient declined      Would patient like information on creating a medical advance directive? No - Patient declined  No - Patient declined  No - Patient declined No - Patient declined     Current Medications (verified) Outpatient Encounter Medications as of 09/08/2022  Medication Sig   Ascorbic Acid (VITAMIN C) 500 MG CAPS Take by mouth 2 (two) times daily.   benzonatate (TESSALON) 100 MG capsule Take 1 capsule (100 mg total) by mouth 2 (two) times daily as needed for cough.   brimonidine (ALPHAGAN) 0.2 % ophthalmic solution Place into the right eye.   cholecalciferol (VITAMIN D3) 25 MCG (1000 UT) tablet Take 1,000 Units by mouth daily.   EPINEPHrine 0.3 mg/0.3 mL IJ SOAJ injection Inject 0.3 mg into the muscle as needed for anaphylaxis.   Multiple Vitamin (MULTIVITAMIN) capsule Take 1 capsule by mouth daily.   mupirocin ointment (BACTROBAN) 2 % Apply 1 Application topically 2 (two) times daily.   silodosin (RAPAFLO) 4 MG CAPS capsule TAKE 1 CAPSULE BY MOUTH ONCE DAILY WITH BREAKFAST   No facility-administered encounter  medications on file as of 09/08/2022.    Allergies (verified) Bee venom and Codeine sulfate   History: Past Medical History:  Diagnosis Date   BPH (benign prostatic hypertrophy)    Kidney stones    Left wrist fracture 1984   Osteoporosis    Right wrist fracture 1996   Past Surgical History:  Procedure Laterality Date   HERNIA REPAIR Bilateral    inguinal   TONSILLECTOMY     Family History  Problem Relation Age of Onset   Cancer Mother        leukemia   Heart disease Father        heart attack   Cancer Brother        Lymphoma   Social History   Socioeconomic History   Marital status: Married    Spouse name: Not on file   Number of children: Not on file   Years of education: Not on file   Highest education level: Not on file  Occupational History   Not on file  Tobacco Use   Smoking status: Never   Smokeless tobacco: Never  Vaping Use   Vaping Use: Never used  Substance and Sexual Activity   Alcohol use: No    Alcohol/week: 0.0 standard drinks of alcohol   Drug use: No   Sexual activity:  Not on file  Other Topics Concern   Not on file  Social History Narrative   Not on file   Social Determinants of Health   Financial Resource Strain: Low Risk  (09/08/2022)   Overall Financial Resource Strain (CARDIA)    Difficulty of Paying Living Expenses: Not hard at all  Food Insecurity: No Food Insecurity (09/08/2022)   Hunger Vital Sign    Worried About Running Out of Food in the Last Year: Never true    Ran Out of Food in the Last Year: Never true  Transportation Needs: No Transportation Needs (09/08/2022)   PRAPARE - Hydrologist (Medical): No    Lack of Transportation (Non-Medical): No  Physical Activity: Sufficiently Active (09/08/2022)   Exercise Vital Sign    Days of Exercise per Week: 5 days    Minutes of Exercise per Session: 30 min  Stress: No Stress Concern Present (09/08/2022)   [place]    Feeling of Stress : Not at all  Social Connections: Unknown (09/08/2022)   Social Connection and Isolation Panel [NHANES]    Frequency of Communication with Friends and Family: Not on file    Frequency of Social Gatherings with Friends and Family: Not on file    Attends Religious Services: Not on file    Active Member of Clubs or Organizations: Not on file    Attends Archivist Meetings: Not on file    Marital Status: Married    Tobacco Counseling Counseling given: Not Answered   Clinical Intake:  Pre-visit preparation completed: Yes        Diabetes: No  How often do you need to have someone help you when you read instructions, pamphlets, or other written materials from your doctor or pharmacy?: 1 - Never    Interpreter Needed?: No      Activities of Daily Living    09/08/2022    2:42 PM  In your present state of health, do you have any difficulty performing the following activities:  Hearing? 0  Vision? 0  Difficulty concentrating or making decisions? 0  Walking or climbing stairs? 0  Dressing or bathing? 0  Doing errands, shopping? 0  Preparing Food and eating ? N  Using the Toilet? N  In the past six months, have you accidently leaked urine? N  Do you have problems with loss of bowel control? N  Managing your Medications? N  Managing your Finances? Y  Comment Wife assist  Housekeeping or managing your Housekeeping? N    Patient Care Team: Einar Pheasant, MD as PCP - General (Internal Medicine) Kate Sable, MD as PCP - Cardiology (Cardiology)  Indicate any recent Medical Services you may have received from other than [facility] providers in the past year (date may be approximate).     Assessment:   This is a routine wellness examination for [place].  I connected with  Vipul Cafarelli Sookdeo on 09/08/22 by a audio enabled telemedicine application and verified that I am speaking with the correct person using two  identifiers.  Patient Location: Home  Provider Location: Office/Clinic  I discussed the limitations of evaluation and management by telemedicine. The patient expressed understanding and agreed to proceed.   Hearing/Vision screen Hearing Screening - Comments:: Patient is able to hear conversational tones without difficulty. No issues reported. Vision Screening - Comments:: Followed by Dr. Ellin Mayhew Wears corrective lenses  Dietary issues and exercise activities discussed: Current Exercise Habits:  Home exercise routine, Type of exercise: walking, Time (Minutes): 30, Frequency (Times/Week): 5, Weekly Exercise (Minutes/Week): 150, Intensity: Mild   Goals Addressed             This Visit's Progress    Maintain healthy lifestyle       Stay hydrated Stay active Healthy diet       Depression Screen    09/08/2022    2:42 PM 07/21/2022   11:39 AM 02/09/2022    1:04 PM 12/30/2021    9:29 AM 08/27/2021    9:47 AM 08/26/2020   10:42 AM 08/26/2019   10:49 AM  PHQ 2/9 Scores  PHQ - 2 Score 0 0 0 0 0 0 0    Fall Risk    09/08/2022    2:42 PM 07/21/2022   11:39 AM 02/09/2022    1:04 PM 12/30/2021    9:29 AM 08/27/2021    9:50 AM  [place] in the past year? 0 0 1 1 0  Number falls in past yr: 0 0 0 0 0  Injury with Fall? 0 0 0 0 0  Risk for fall due to :  No Fall Risks History of fall(s) History of fall(s)   Follow up Falls evaluation completed;Falls prevention discussed Falls evaluation completed Falls evaluation completed Falls evaluation completed     FALL RISK PREVENTION PERTAINING TO THE HOME: Home free of loose throw rugs in walkways, pet beds, electrical cords, etc? Yes  Adequate lighting in your home to reduce risk of falls? Yes   ASSISTIVE DEVICES UTILIZED TO PREVENT FALLS: Life alert? No  Use of a cane, walker or w/c? No   TIMED UP AND GO: Was the test performed? No .   Cognitive Function:        09/08/2022    3:34 PM 08/26/2020   11:27 AM 08/26/2019    10:53 AM 08/21/2018    8:33 AM  6CIT Screen  What Year? 0 points 0 points 0 points 0 points  What month? 0 points 0 points 0 points 0 points  What time? 0 points 0 points 0 points 0 points  Count back from 20 0 points 0 points 0 points 0 points  Months in reverse 0 points 0 points 0 points 0 points  Repeat phrase  0 points 0 points 0 points  Total Score  0 points 0 points 0 points    Immunizations Immunization History  Administered Date(s) Administered   Moderna Sars-Covid-2 Vaccination 10/30/2019, 11/27/2019   Pneumococcal Conjugate-13 04/03/2014   Pneumococcal Polysaccharide-23 10/14/2016   Tdap 11/17/2010   TDAP status: Due, Education has been provided regarding the importance of this vaccine. Advised may receive this vaccine at local pharmacy or Health Dept. Aware to provide a copy of the vaccination record if obtained from local pharmacy or Health Dept. Verbalized acceptance and understanding.  Shingrix Completed?: No.    Education has been provided regarding the importance of this vaccine. Patient has been advised to call insurance company to determine out of pocket expense if they have not yet received this vaccine. Advised may also receive vaccine at local pharmacy or Health Dept. Verbalized acceptance and understanding.  Screening Tests Health Maintenance  Topic Date Due   DTaP/Tdap/Td (2 - Td or Tdap) 11/16/2020   COVID-19 Vaccine (3 - 2023-24 season) 09/24/2022 (Originally 05/06/2022)   INFLUENZA VACCINE  12/04/2022 (Originally 04/05/2022)   Zoster Vaccines- Shingrix (1 of 2) 12/08/2022 (Originally 10/04/1983)   Medicare Annual Wellness (AWV)  09/09/2023   Pneumonia Vaccine 1+ Years old  Completed   HPV VACCINES  Aged Out   Health Maintenance Health Maintenance Due  Topic Date Due   DTaP/Tdap/Td (2 - Td or Tdap) 11/16/2020   Lung Cancer Screening: (Low Dose CT Chest recommended if Age 5-80 years, 30 pack-year currently smoking OR have quit w/in 15years.) does not  qualify.   Hepatitis C Screening: does not qualify.  Vision Screening: Recommended annual ophthalmology exams for early detection of glaucoma and other disorders of the eye.  Dental Screening: Recommended annual dental exams for proper oral hygiene.  Community Resource Referral / Chronic Care Management: CRR required this visit?  No   CCM required this visit?  No      Plan:     I have personally reviewed and noted the following in the patient's chart:   Medical and social history Use of alcohol, tobacco or illicit drugs  Current medications and supplements including opioid prescriptions. Patient is not currently taking opioid prescriptions. Functional ability and status Nutritional status Physical activity Advanced directives List of other physicians Hospitalizations, surgeries, and ER visits in previous 12 months Vitals Screenings to include cognitive, depression, and falls Referrals and appointments  In addition, I have reviewed and discussed with patient certain preventive protocols, quality metrics, and best practice recommendations. A written personalized care plan for preventive services as well as general preventive health recommendations were provided to patient.     Leta Jungling, LPN   09/13/2991

## 2022-09-19 ENCOUNTER — Encounter: Payer: Self-pay | Admitting: Cardiology

## 2022-09-19 ENCOUNTER — Ambulatory Visit: Payer: Medicare HMO | Attending: Cardiology | Admitting: Cardiology

## 2022-09-20 DIAGNOSIS — H16142 Punctate keratitis, left eye: Secondary | ICD-10-CM | POA: Diagnosis not present

## 2022-09-20 DIAGNOSIS — H02215 Cicatricial lagophthalmos left lower eyelid: Secondary | ICD-10-CM | POA: Diagnosis not present

## 2022-09-26 ENCOUNTER — Telehealth: Payer: Self-pay | Admitting: Cardiology

## 2022-09-26 NOTE — Telephone Encounter (Signed)
Patient stopped in to tell us he did not need a fu appt due, please assist as needed.

## 2022-09-26 NOTE — Telephone Encounter (Signed)
Noted. MD made aware.  Patient refused cath Patient denied TAVR eval.

## 2022-10-31 ENCOUNTER — Other Ambulatory Visit (INDEPENDENT_AMBULATORY_CARE_PROVIDER_SITE_OTHER): Payer: Medicare HMO

## 2022-10-31 DIAGNOSIS — I350 Nonrheumatic aortic (valve) stenosis: Secondary | ICD-10-CM | POA: Diagnosis not present

## 2022-10-31 LAB — HEPATIC FUNCTION PANEL
ALT: 7 U/L (ref 0–53)
AST: 22 U/L (ref 0–37)
Albumin: 4 g/dL (ref 3.5–5.2)
Alkaline Phosphatase: 65 U/L (ref 39–117)
Bilirubin, Direct: 0.1 mg/dL (ref 0.0–0.3)
Total Bilirubin: 0.5 mg/dL (ref 0.2–1.2)
Total Protein: 6.9 g/dL (ref 6.0–8.3)

## 2022-10-31 LAB — LIPID PANEL
Cholesterol: 160 mg/dL (ref 0–200)
HDL: 58 mg/dL (ref >39.00)
LDL Cholesterol: 90 mg/dL (ref 0–99)
NonHDL: 102.38
Total CHOL/HDL Ratio: 3
Triglycerides: 60 mg/dL (ref 0.0–149.0)
VLDL: 12 mg/dL (ref 0.0–40.0)

## 2022-10-31 LAB — BASIC METABOLIC PANEL
BUN: 27 mg/dL — ABNORMAL HIGH (ref 6–23)
CO2: 27 mEq/L (ref 19–32)
Calcium: 9.3 mg/dL (ref 8.4–10.5)
Chloride: 102 mEq/L (ref 96–112)
Creatinine, Ser: 1.11 mg/dL (ref 0.40–1.50)
GFR: 59.05 mL/min — ABNORMAL LOW (ref >60.00)
Glucose, Bld: 95 mg/dL (ref 70–99)
Potassium: 4.6 mEq/L (ref 3.5–5.1)
Sodium: 137 mEq/L (ref 135–145)

## 2022-10-31 LAB — TSH: TSH: 1.63 u[IU]/mL (ref 0.35–5.50)

## 2022-11-03 ENCOUNTER — Ambulatory Visit (INDEPENDENT_AMBULATORY_CARE_PROVIDER_SITE_OTHER): Payer: Medicare HMO | Admitting: Internal Medicine

## 2022-11-03 ENCOUNTER — Encounter: Payer: Self-pay | Admitting: Internal Medicine

## 2022-11-03 VITALS — BP 120/78 | HR 77 | Temp 97.9°F | Resp 16 | Ht 65.0 in | Wt 148.8 lb

## 2022-11-03 DIAGNOSIS — F439 Reaction to severe stress, unspecified: Secondary | ICD-10-CM

## 2022-11-03 DIAGNOSIS — I350 Nonrheumatic aortic (valve) stenosis: Secondary | ICD-10-CM | POA: Diagnosis not present

## 2022-11-03 DIAGNOSIS — R69 Illness, unspecified: Secondary | ICD-10-CM | POA: Diagnosis not present

## 2022-11-03 DIAGNOSIS — R42 Dizziness and giddiness: Secondary | ICD-10-CM | POA: Diagnosis not present

## 2022-11-03 DIAGNOSIS — Z13220 Encounter for screening for lipoid disorders: Secondary | ICD-10-CM

## 2022-11-03 DIAGNOSIS — R972 Elevated prostate specific antigen [PSA]: Secondary | ICD-10-CM | POA: Diagnosis not present

## 2022-11-03 DIAGNOSIS — N400 Enlarged prostate without lower urinary tract symptoms: Secondary | ICD-10-CM | POA: Diagnosis not present

## 2022-11-03 MED ORDER — SILODOSIN 4 MG PO CAPS: 4.0000 mg | ORAL_CAPSULE | Freq: Every day | ORAL | 3 refills | Status: DC

## 2022-11-03 NOTE — Progress Notes (Signed)
Subjective:    Patient ID: Marvin Farley, male    DOB: 1932/03/04, 89 y.o.   MRN: [id number]  Patient here for  Chief Complaint  Patient presents with   Medical Management of Chronic Issues    HPI Follow up - aortic valve stenosis. Saw cardiology 08/11/22 - repeat echo 07/2022 showed severe AS. EF normal 5-60%. Right and left heart cath was recommended, but upon scheduling by staff, patient declined stating not wanting cardiac cath.  He informed me that he desires no further intervention or testing regarding his heart valve.  Stays very active.  No chest pain or sob reported.  No increased heart rate or palpitations.  No syncope or near syncope.  No abdominal pain.  No bowel issues.  Overall feels good.   Past Medical History:  Diagnosis Date   BPH (benign prostatic hypertrophy)    Kidney stones    Left wrist fracture 1984   Osteoporosis    Right wrist fracture 1996   Past Surgical History:  Procedure Laterality Date   HERNIA REPAIR Bilateral    inguinal   TONSILLECTOMY     Family History  Problem Relation Age of Onset   Cancer Mother        leukemia   Heart disease Father        heart attack   Cancer Brother        Lymphoma   Social History   Socioeconomic History   Marital status: Married    Spouse name: Not on file   Number of children: Not on file   Years of education: Not on file   Highest education level: Not on file  Occupational History   Not on file  Tobacco Use   Smoking status: Never   Smokeless tobacco: Never  Vaping Use   Vaping Use: Never used  Substance and Sexual Activity   Alcohol use: No    Alcohol/week: 0.0 standard drinks of alcohol   Drug use: No   Sexual activity: Not on file  Other Topics Concern   Not on file  Social History Narrative   Not on file   Social Determinants of Health   Financial Resource Strain: Low Risk  (09/08/2022)   Overall Financial Resource Strain (CARDIA)    Difficulty of Paying Living Expenses: Not hard at  all  Food Insecurity: No Food Insecurity (09/08/2022)   Hunger Vital Sign    Worried About Running Out of Food in the Last Year: Never true    [place] in the Last Year: Never true  Transportation Needs: No Transportation Needs (09/08/2022)   PRAPARE - Hydrologist (Medical): No    Lack of Transportation (Non-Medical): No  Physical Activity: Sufficiently Active (09/08/2022)   Exercise Vital Sign    Days of Exercise per Week: 5 days    Minutes of Exercise per Session: 30 min  Stress: No Stress Concern Present (09/08/2022)   [place]    Feeling of Stress : Not at all  Social Connections: Unknown (09/08/2022)   Social Connection and Isolation Panel [NHANES]    Frequency of Communication with Friends and Family: Not on file    Frequency of Social Gatherings with Friends and Family: Not on file    Attends Religious Services: Not on file    Active Member of Clubs or Organizations: Not on file    Attends Archivist Meetings: Not on file  Marital Status: Married     Review of Systems  Constitutional:  Negative for appetite change and unexpected weight change.  HENT:  Negative for congestion and sinus pressure.   Respiratory:  Negative for cough, chest tightness and shortness of breath.   Cardiovascular:  Negative for chest pain, palpitations and leg swelling.  Gastrointestinal:  Negative for abdominal pain, diarrhea, nausea and vomiting.  Genitourinary:  Negative for difficulty urinating and dysuria.  Musculoskeletal:  Negative for joint swelling and myalgias.  Skin:  Negative for color change and rash.  Neurological:  Negative for dizziness and headaches.  Psychiatric/Behavioral:  Negative for agitation and dysphoric mood.        Objective:     BP 120/78   Pulse 77   Temp 97.9 F (36.6 C)   Resp 16   Ht '5\' 5"'$  (1.651 m)   Wt 148 lb 12.8 oz (67.5 kg)   SpO2 98%   BMI  24.76 kg/m  Wt Readings from Last 3 Encounters:  11/03/22 148 lb 12.8 oz (67.5 kg)  09/08/22 146 lb (66.2 kg)  08/11/22 146 lb 6.4 oz (66.4 kg)    Physical Exam Constitutional:      General: He is not in acute distress.    Appearance: Normal appearance. He is well-developed.  HENT:     Head: Normocephalic and atraumatic.     Right Ear: External ear normal.     Left Ear: External ear normal.  Eyes:     General: No scleral icterus.       Right eye: No discharge.        Left eye: No discharge.  Cardiovascular:     Rate and Rhythm: Normal rate and regular rhythm.  Pulmonary:     Effort: Pulmonary effort is normal. No respiratory distress.     Breath sounds: Normal breath sounds.  Abdominal:     General: Bowel sounds are normal.     Palpations: Abdomen is soft.     Tenderness: There is no abdominal tenderness.  Musculoskeletal:        General: No swelling or tenderness.     Cervical back: Neck supple. No tenderness.  Lymphadenopathy:     Cervical: No cervical adenopathy.  Skin:    Findings: No erythema or rash.  Neurological:     Mental Status: He is alert.  Psychiatric:        Mood and Affect: Mood normal.        Behavior: Behavior normal.      Outpatient Encounter Medications as of 11/03/2022  Medication Sig   Ascorbic Acid (VITAMIN C) 500 MG CAPS Take by mouth 2 (two) times daily.   brimonidine (ALPHAGAN) 0.2 % ophthalmic solution Place 1 drop into both eyes daily.   cholecalciferol (VITAMIN D3) 25 MCG (1000 UT) tablet Take 1,000 Units by mouth daily.   EPINEPHrine 0.3 mg/0.3 mL IJ SOAJ injection Inject 0.3 mg into the muscle as needed for anaphylaxis.   Multiple Vitamin (MULTIVITAMIN) capsule Take 1 capsule by mouth daily.   mupirocin ointment (BACTROBAN) 2 % Apply 1 Application topically 2 (two) times daily.   silodosin (RAPAFLO) 4 MG CAPS capsule Take 1 capsule (4 mg total) by mouth daily with breakfast.   [DISCONTINUED] benzonatate (TESSALON) 100 MG capsule Take  1 capsule (100 mg total) by mouth 2 (two) times daily as needed for cough.   [DISCONTINUED] silodosin (RAPAFLO) 4 MG CAPS capsule TAKE 1 CAPSULE BY MOUTH ONCE DAILY WITH BREAKFAST   No facility-administered encounter medications  on file as of 11/03/2022.     Lab Results  Component Value Date   WBC 4.4 07/21/2022   HGB 14.4 07/21/2022   HCT 42.6 07/21/2022   PLT 216.0 07/21/2022   GLUCOSE 95 10/31/2022   CHOL 160 10/31/2022   TRIG 60.0 10/31/2022   HDL 58.00 10/31/2022   LDLCALC 90 10/31/2022   ALT 7 10/31/2022   AST 22 10/31/2022   NA 137 10/31/2022   K 4.6 10/31/2022   CL 102 10/31/2022   CREATININE 1.11 10/31/2022   BUN 27 (H) 10/31/2022   CO2 27 10/31/2022   TSH 1.63 10/31/2022   PSA 6.18 (H) 12/26/2019       Assessment & Plan:  Screening cholesterol level -     Lipid panel; Future -     Hepatic function panel; Future -     Basic metabolic panel; Future  Aortic valve stenosis, etiology of cardiac valve disease unspecified Assessment & Plan: Dr Garen Lah - 08/11/22 - Aortic valve stenosis, repeat echo 07/2022 showed severe AS (DVI 0.17, AVA 0.5cm2, mean gradient 34 mmHg-likely underestimated, Vmax 3.7 m/s).  Currently declines any further evaluation.  No chest pain, sob.  Dizziness has resolved.  Follow.     BPH with elevated PSA Assessment & Plan: Continue rapaflo.    Dizziness Assessment & Plan: Not an issue for him now.  States when he avoid foods with increased sugar, does not have issues with dizziness.  Follow.     Stress Assessment & Plan: Overall he feels he is handling things well.  Follow.    Other orders -     Silodosin; Take 1 capsule (4 mg total) by mouth daily with breakfast.  Dispense: 90 capsule; Refill: 3     Einar Pheasant, MD

## 2022-11-07 ENCOUNTER — Encounter: Payer: Self-pay | Admitting: Internal Medicine

## 2022-11-07 NOTE — Assessment & Plan Note (Signed)
Dr Garen Lah - 08/11/22 - Aortic valve stenosis, repeat echo 07/2022 showed severe AS (DVI 0.17, AVA 0.5cm2, mean gradient 34 mmHg-likely underestimated, Vmax 3.7 m/s).  Currently declines any further evaluation.  No chest pain, sob.  Dizziness has resolved.  Follow.

## 2022-11-07 NOTE — Assessment & Plan Note (Signed)
Overall he feels he is handling things well.  Follow.

## 2022-11-07 NOTE — Assessment & Plan Note (Signed)
Not an issue for him now.  States when he avoid foods with increased sugar, does not have issues with dizziness.  Follow.

## 2022-11-07 NOTE — Assessment & Plan Note (Signed)
-

## 2022-12-15 ENCOUNTER — Ambulatory Visit (INDEPENDENT_AMBULATORY_CARE_PROVIDER_SITE_OTHER): Payer: Medicare HMO | Admitting: Dermatology

## 2022-12-15 VITALS — BP 126/78 | HR 79

## 2022-12-15 DIAGNOSIS — L578 Other skin changes due to chronic exposure to nonionizing radiation: Secondary | ICD-10-CM

## 2022-12-15 DIAGNOSIS — C44219 Basal cell carcinoma of skin of left ear and external auricular canal: Secondary | ICD-10-CM

## 2022-12-15 DIAGNOSIS — C4491 Basal cell carcinoma of skin, unspecified: Secondary | ICD-10-CM

## 2022-12-15 DIAGNOSIS — L570 Actinic keratosis: Secondary | ICD-10-CM

## 2022-12-15 DIAGNOSIS — D492 Neoplasm of unspecified behavior of bone, soft tissue, and skin: Secondary | ICD-10-CM

## 2022-12-15 HISTORY — DX: Basal cell carcinoma of skin, unspecified: C44.91

## 2022-12-15 NOTE — Progress Notes (Signed)
New Patient Visit   Subjective  Marvin Farley is a 89 y.o. male who presents for the following: The patient has spots on his face and ears to be evaluated, some may be new or changing and the patient has concerns that these could be cancer.    The following portions of the chart were reviewed this encounter and updated as appropriate: medications, allergies, medical history  Review of Systems:  No other skin or systemic complaints except as noted in HPI or Assessment and Plan.  Objective  Well appearing patient in no apparent distress; mood and affect are within normal limits.  A focused examination was performed of the following areas:face,ears    Relevant exam findings are noted in the Assessment and Plan.  left ear superior helix 1.2 cm Crusted plaque      Assessment & Plan   ACTINIC KERATOSIS Exam: Erythematous thin papules/macules with gritty scale  Actinic keratoses are precancerous spots that appear secondary to cumulative UV radiation exposure/sun exposure over time. They are chronic with expected duration over 1 year. A portion of actinic keratoses will progress to squamous cell carcinoma of the skin. It is not possible to reliably predict which spots will progress to skin cancer and so treatment is recommended to prevent development of skin cancer.  Recommend daily broad spectrum sunscreen SPF 30+ to sun-exposed areas, reapply every 2 hours as needed.  Recommend staying in the shade or wearing long sleeves, sun glasses (UVA+UVB protection) and wide brim hats (4-inch brim around the entire circumference of the hat). Call for new or changing lesions.  Treatment Plan:  Prior to procedure, discussed risks of blister formation, small wound, skin dyspigmentation, or rare scar following cryotherapy. Recommend Vaseline ointment to treated areas while healing.  Destruction Procedure Note Destruction method: cryotherapy   Informed consent: discussed and consent obtained    Lesion destroyed using liquid nitrogen: Yes   Outcome: patient tolerated procedure well with no complications   Post-procedure details: wound care instructions given   Locations: face,ears  # of Lesions Treated: 16     Neoplasm of skin left ear superior helix  Epidermal / dermal shaving  Lesion diameter (cm):  1.2 Informed consent: discussed and consent obtained   Timeout: patient name, date of birth, surgical site, and procedure verified   Procedure prep:  Patient was prepped and draped in usual sterile fashion Prep type:  Isopropyl alcohol Anesthesia: the lesion was anesthetized in a standard fashion   Anesthetic:  1% lidocaine w/ epinephrine 1-100,000 buffered w/ 8.4% NaHCO3 Hemostasis achieved with: pressure, aluminum chloride and electrodesiccation   Outcome: patient tolerated procedure well   Post-procedure details: sterile dressing applied and wound care instructions given   Dressing type: bandage and petrolatum    Destruction of lesion  Destruction method: electrodesiccation and curettage   Informed consent: discussed and consent obtained   Timeout:  patient name, date of birth, surgical site, and procedure verified Curettage performed in three different directions: Yes   Electrodesiccation performed over the curetted area: Yes   Lesion length (cm):  1.2 Lesion width (cm):  1.2 Margin per side (cm):  0.2 Final wound size (cm):  1.6 Hemostasis achieved with:  pressure, aluminum chloride and electrodesiccation Outcome: patient tolerated procedure well with no complications   Post-procedure details: wound care instructions given    Specimen 1 - Surgical pathology Differential Diagnosis: R/O Skin cancer   Check Margins: No   ACTINIC DAMAGE - chronic, secondary to cumulative UV radiation exposure/sun exposure  over time - diffuse scaly erythematous macules with underlying dyspigmentation - Recommend daily broad spectrum sunscreen SPF 30+ to sun-exposed areas,  reapply every 2 hours as needed.  - Recommend staying in the shade or wearing long sleeves, sun glasses (UVA+UVB protection) and wide brim hats (4-inch brim around the entire circumference of the hat). - Call for new or changing lesions.    Return in about 4 months (around 04/16/2023) for A[facility] .  IAngelique Holm, CMA, am acting as scribe for Armida Sans, MD .   Documentation: I have reviewed the above documentation for accuracy and completeness, and I agree with the above.  Armida Sans, MD

## 2022-12-15 NOTE — Patient Instructions (Addendum)
Cryotherapy Aftercare  Wash gently with soap and water everyday.   Apply Vaseline and Band-Aid daily until healed.    Wound Care Instructions  Cleanse wound gently with soap and water once a day then pat dry with clean gauze. Apply a thin coat of Petrolatum (petroleum jelly, "Vaseline") over the wound (unless you have an allergy to this). We recommend that you use a new, sterile tube of Vaseline. Do not pick or remove scabs. Do not remove the yellow or white "healing tissue" from the base of the wound.  Cover the wound with fresh, clean, nonstick gauze and secure with paper tape. You may use Band-Aids in place of gauze and tape if the wound is small enough, but would recommend trimming much of the tape off as there is often too much. Sometimes Band-Aids can irritate the skin.  You should call the office for your biopsy report after 1 week if you have not already been contacted.  If you experience any problems, such as abnormal amounts of bleeding, swelling, significant bruising, significant pain, or evidence of infection, please call the office immediately.  FOR ADULT SURGERY PATIENTS: If you need something for pain relief you may take 1 extra strength Tylenol (acetaminophen) AND 2 Ibuprofen (200mg each) together every 4 hours as needed for pain. (do not take these if you are allergic to them or if you have a reason you should not take them.) Typically, you may only need pain medication for 1 to 3 days.     Due to recent changes in healthcare laws, you may see results of your pathology and/or laboratory studies on MyChart before the doctors have had a chance to review them. We understand that in some cases there may be results that are confusing or concerning to you. Please understand that not all results are received at the same time and often the doctors may need to interpret multiple results in order to provide you with the best plan of care or course of treatment. Therefore, we ask that you  please give us 2 business days to thoroughly review all your results before contacting the office for clarification. Should we see a critical lab result, you will be contacted sooner.   If You Need Anything After Your Visit  If you have any questions or concerns for your doctor, please call our main line at [phone] and press option 4 to reach your doctor's medical assistant. If no one answers, please leave a voicemail as directed and we will return your call as soon as possible. Messages left after 4 pm will be answered the following business day.   You may also send us a message via MyChart. We typically respond to MyChart messages within 1-2 business days.  For prescription refills, please ask your pharmacy to contact our office. Our fax number is [phone].  If you have an urgent issue when the clinic is closed that cannot wait until the next business day, you can page your doctor at the number below.    Please note that while we do our best to be available for urgent issues outside of office hours, we are not available 24/7.   If you have an urgent issue and are unable to reach us, you may choose to seek medical care at your doctor's office, retail clinic, urgent care center, or emergency room.  If you have a medical emergency, please immediately call 911 or go to the emergency department.  Pager Numbers  - Dr. Kowalski: [phone]  -   Dr. Moye: [phone]  - Dr. Stewart: [phone]  In the event of inclement weather, please call our main line at [phone] for an update on the status of any delays or closures.  Dermatology Medication Tips: Please keep the boxes that topical medications come in in order to help keep track of the instructions about where and how to use these. Pharmacies typically print the medication instructions only on the boxes and not directly on the medication tubes.   If your medication is too expensive, please contact our office at  [phone] option 4 or send us a message through MyChart.   We are unable to tell what your co-pay for medications will be in advance as this is different depending on your insurance coverage. However, we may be able to find a substitute medication at lower cost or fill out paperwork to get insurance to cover a needed medication.   If a prior authorization is required to get your medication covered by your insurance company, please allow us 1-2 business days to complete this process.  Drug prices often vary depending on where the prescription is filled and some pharmacies may offer cheaper prices.  The website www.goodrx.com contains coupons for medications through different pharmacies. The prices here do not account for what the cost may be with help from insurance (it may be cheaper with your insurance), but the website can give you the price if you did not use any insurance.  - You can print the associated coupon and take it with your prescription to the pharmacy.  - You may also stop by our office during regular business hours and pick up a GoodRx coupon card.  - If you need your prescription sent electronically to a different pharmacy, notify our office through [facility] MyChart or by phone at [phone] option 4.     Si Usted Necesita Algo Despus de Su Visita  Tambin puede enviarnos un mensaje a travs de MyChart. Por lo general respondemos a los mensajes de MyChart en el transcurso de 1 a 2 das hbiles.  Para renovar recetas, por favor pida a su farmacia que se ponga en contacto con nuestra oficina. Nuestro nmero de fax es el [phone].  Si tiene un asunto urgente cuando la clnica est cerrada y que no puede esperar hasta el siguiente da hbil, puede llamar/localizar a su doctor(a) al nmero que aparece a continuacin.   Por favor, tenga en cuenta que aunque hacemos todo lo posible para estar disponibles para asuntos urgentes fuera del horario de oficina, no estamos  disponibles las 24 horas del da, los 7 das de la semana.   Si tiene un problema urgente y no puede comunicarse con nosotros, puede optar por buscar atencin mdica  en el consultorio de su doctor(a), en una clnica privada, en un centro de atencin urgente o en una sala de emergencias.  Si tiene una emergencia mdica, por favor llame inmediatamente al 911 o vaya a la sala de emergencias.  Nmeros de bper  - Dr. Kowalski: [phone]  - Dra. Moye: [phone]  - Dra. Stewart: [phone]  En caso de inclemencias del tiempo, por favor llame a nuestra lnea principal al [phone] para una actualizacin sobre el estado de cualquier retraso o cierre.  Consejos para la medicacin en dermatologa: Por favor, guarde las cajas en las que vienen los medicamentos de uso tpico para ayudarle a seguir las instrucciones sobre dnde y cmo usarlos. Las farmacias generalmente imprimen las instrucciones del medicamento slo en las cajas y   no directamente en los tubos del medicamento.   Si su medicamento es muy caro, por favor, pngase en contacto con nuestra oficina llamando al [phone] y presione la opcin 4 o envenos un mensaje a travs de MyChart.   No podemos decirle cul ser su copago por los medicamentos por adelantado ya que esto es diferente dependiendo de la cobertura de su seguro. Sin embargo, es posible que podamos encontrar un medicamento sustituto a menor costo o llenar un formulario para que el seguro cubra el medicamento que se considera necesario.   Si se requiere una autorizacin previa para que su compaa de seguros cubra su medicamento, por favor permtanos de 1 a 2 das hbiles para completar este proceso.  Los precios de los medicamentos varan con frecuencia dependiendo del lugar de dnde se surte la receta y alguna farmacias pueden ofrecer precios ms baratos.  El sitio web www.goodrx.com tiene cupones para medicamentos de diferentes farmacias. Los precios aqu no  tienen en cuenta lo que podra costar con la ayuda del seguro (puede ser ms barato con su seguro), pero el sitio web puede darle el precio si no utiliz ningn seguro.  - Puede imprimir el cupn correspondiente y llevarlo con su receta a la farmacia.  - Tambin puede pasar por nuestra oficina durante el horario de atencin regular y recoger una tarjeta de cupones de GoodRx.  - Si necesita que su receta se enve electrnicamente a una farmacia diferente, informe a nuestra oficina a travs de MyChart de [facility] o por telfono llamando al [phone] y presione la opcin 4.  

## 2022-12-20 ENCOUNTER — Telehealth: Payer: Self-pay

## 2022-12-20 NOTE — Telephone Encounter (Signed)
-----   Message from Deirdre Evener, MD sent at 12/20/2022 12:19 PM EDT ----- Diagnosis Skin , left ear superior helix BASAL CELL CARCINOMA, NODULAR PATTERN, BASE INVOLVED  Cancer - BCC Already treated  Recheck next visit

## 2022-12-20 NOTE — Telephone Encounter (Signed)
Tried to call patient regarding pathology results - no answer/hd 

## 2022-12-27 ENCOUNTER — Telehealth: Payer: Self-pay

## 2022-12-27 NOTE — Telephone Encounter (Signed)
Advised patient of pathology results/hd 

## 2022-12-27 NOTE — Telephone Encounter (Signed)
-----   Message from David C Kowalski, MD sent at 12/20/2022 12:19 PM EDT ----- Diagnosis Skin , left ear superior helix BASAL CELL CARCINOMA, NODULAR PATTERN, BASE INVOLVED  Cancer - BCC Already treated  Recheck next visit 

## 2023-01-01 ENCOUNTER — Encounter: Payer: Self-pay | Admitting: Dermatology

## 2023-03-01 ENCOUNTER — Other Ambulatory Visit (INDEPENDENT_AMBULATORY_CARE_PROVIDER_SITE_OTHER): Payer: Medicare HMO

## 2023-03-01 DIAGNOSIS — Z13220 Encounter for screening for lipoid disorders: Secondary | ICD-10-CM | POA: Diagnosis not present

## 2023-03-01 LAB — LIPID PANEL
Cholesterol: 151 mg/dL (ref 0–200)
HDL: 53.8 mg/dL (ref >39.00)
LDL Cholesterol: 85 mg/dL (ref 0–99)
NonHDL: 96.87
Total CHOL/HDL Ratio: 3
Triglycerides: 58 mg/dL (ref 0.0–149.0)
VLDL: 11.6 mg/dL (ref 0.0–40.0)

## 2023-03-01 LAB — BASIC METABOLIC PANEL
BUN: 19 mg/dL (ref 6–23)
CO2: 27 mEq/L (ref 19–32)
Calcium: 9.2 mg/dL (ref 8.4–10.5)
Chloride: 103 mEq/L (ref 96–112)
Creatinine, Ser: 1 mg/dL (ref 0.40–1.50)
GFR: 66.78 mL/min (ref >60.00)
Glucose, Bld: 90 mg/dL (ref 70–99)
Potassium: 4.3 mEq/L (ref 3.5–5.1)
Sodium: 137 mEq/L (ref 135–145)

## 2023-03-01 LAB — HEPATIC FUNCTION PANEL
ALT: 6 U/L (ref 0–53)
AST: 19 U/L (ref 0–37)
Albumin: 4 g/dL (ref 3.5–5.2)
Alkaline Phosphatase: 65 U/L (ref 39–117)
Bilirubin, Direct: 0.1 mg/dL (ref 0.0–0.3)
Total Bilirubin: 0.6 mg/dL (ref 0.2–1.2)
Total Protein: 6.8 g/dL (ref 6.0–8.3)

## 2023-03-06 ENCOUNTER — Ambulatory Visit (INDEPENDENT_AMBULATORY_CARE_PROVIDER_SITE_OTHER): Payer: Medicare HMO | Admitting: Internal Medicine

## 2023-03-06 ENCOUNTER — Encounter: Payer: Self-pay | Admitting: Internal Medicine

## 2023-03-06 VITALS — BP 112/82 | HR 81 | Temp 98.4°F | Resp 16 | Ht 65.0 in | Wt 146.5 lb

## 2023-03-06 DIAGNOSIS — I350 Nonrheumatic aortic (valve) stenosis: Secondary | ICD-10-CM | POA: Diagnosis not present

## 2023-03-06 DIAGNOSIS — F439 Reaction to severe stress, unspecified: Secondary | ICD-10-CM | POA: Diagnosis not present

## 2023-03-06 DIAGNOSIS — Z13220 Encounter for screening for lipoid disorders: Secondary | ICD-10-CM | POA: Diagnosis not present

## 2023-03-06 NOTE — Progress Notes (Signed)
Subjective:    Patient ID: Marvin Farley, male    DOB: 09-Apr-1932, 89 y.o.   MRN: [id number]  Patient here for  Chief Complaint  Patient presents with   Medical Management of Chronic Issues    Follow up    HPI Follow up - aortic valve stenosis. Saw cardiology 08/11/22 - repeat echo 07/2022 showed severe AS. EF normal 5-60%. Right and left heart cath was recommended, but upon scheduling by staff, patient declined stating not wanting cardiac cath.  He informed me that he desires no further intervention or testing regarding his heart valve.  Discussed again today. Stays very active.  No chest pain or sob reported.  Eating.  No nausea or vomiting reported.  No bowel change reported.    Past Medical History:  Diagnosis Date   Basal cell carcinoma 12/15/2022   Left ear sup helix - [facility]   BPH (benign prostatic hypertrophy)    Kidney stones    Left wrist fracture 09/05/1982   Osteoporosis    Right wrist fracture 09/05/1994   Past Surgical History:  Procedure Laterality Date   HERNIA REPAIR Bilateral    inguinal   TONSILLECTOMY     Family History  Problem Relation Age of Onset   Cancer Mother        leukemia   Heart disease Father        heart attack   Cancer Brother        Lymphoma   Social History   Socioeconomic History   Marital status: Married    Spouse name: Not on file   Number of children: Not on file   Years of education: Not on file   Highest education level: Not on file  Occupational History   Not on file  Tobacco Use   Smoking status: Never   Smokeless tobacco: Never  Vaping Use   Vaping Use: Never used  Substance and Sexual Activity   Alcohol use: No    Alcohol/week: 0.0 standard drinks of alcohol   Drug use: No   Sexual activity: Not on file  Other Topics Concern   Not on file  Social History Narrative   Not on file   Social Determinants of Health   Financial Resource Strain: Low Risk  (09/08/2022)   Overall Financial Resource Strain (CARDIA)     Difficulty of Paying Living Expenses: Not hard at all  Food Insecurity: No Food Insecurity (09/08/2022)   Hunger Vital Sign    Worried About Running Out of Food in the Last Year: Never true    Ran Out of Food in the Last Year: Never true  Transportation Needs: No Transportation Needs (09/08/2022)   PRAPARE - Administrator, Civil Service (Medical): No    Lack of Transportation (Non-Medical): No  Physical Activity: Sufficiently Active (09/08/2022)   Exercise Vital Sign    Days of Exercise per Week: 5 days    Minutes of Exercise per Session: 30 min  Stress: No Stress Concern Present (09/08/2022)   [employer] of Occupational Health - Occupational Stress Questionnaire    Feeling of Stress : Not at all  Social Connections: Unknown (09/08/2022)   Social Connection and Isolation Panel [NHANES]    Frequency of Communication with Friends and Family: Not on file    Frequency of Social Gatherings with Friends and Family: Not on file    Attends Religious Services: Not on file    Active Member of Clubs or Organizations: Not on file  Attends Banker Meetings: Not on file    Marital Status: Married     Review of Systems  Constitutional:  Negative for appetite change and unexpected weight change.  HENT:  Negative for congestion and sinus pressure.   Respiratory:  Negative for cough, chest tightness and shortness of breath.   Cardiovascular:  Negative for chest pain, palpitations and leg swelling.  Gastrointestinal:  Negative for abdominal pain, diarrhea, nausea and vomiting.  Genitourinary:  Negative for difficulty urinating and dysuria.  Musculoskeletal:  Negative for joint swelling and myalgias.  Skin:  Negative for color change and rash.  Neurological:  Negative for dizziness and headaches.  Psychiatric/Behavioral:  Negative for agitation and dysphoric mood.        Objective:     BP 112/82   Pulse 81   Temp 98.4 F (36.9 C)   Resp 16   Ht 5\' 5"  (1.651 m)    Wt 146 lb 8 oz (66.5 kg)   SpO2 98%   BMI 24.38 kg/m  Wt Readings from Last 3 Encounters:  03/06/23 146 lb 8 oz (66.5 kg)  11/03/22 148 lb 12.8 oz (67.5 kg)  09/08/22 146 lb (66.2 kg)    Physical Exam Vitals reviewed.  Constitutional:      General: He is not in acute distress.    Appearance: Normal appearance. He is well-developed.  HENT:     Head: Normocephalic and atraumatic.     Right Ear: External ear normal.     Left Ear: External ear normal.  Eyes:     General: No scleral icterus.       Right eye: No discharge.        Left eye: No discharge.     Conjunctiva/sclera: Conjunctivae normal.  Cardiovascular:     Rate and Rhythm: Normal rate and regular rhythm.  Pulmonary:     Effort: Pulmonary effort is normal. No respiratory distress.     Breath sounds: Normal breath sounds.  Abdominal:     General: Bowel sounds are normal.     Palpations: Abdomen is soft.     Tenderness: There is no abdominal tenderness.  Musculoskeletal:        General: No swelling or tenderness.     Cervical back: Neck supple. No tenderness.  Lymphadenopathy:     Cervical: No cervical adenopathy.  Skin:    Findings: No erythema or rash.  Neurological:     Mental Status: He is alert.  Psychiatric:        Mood and Affect: Mood normal.        Behavior: Behavior normal.      Outpatient Encounter Medications as of 03/06/2023  Medication Sig   Ascorbic Acid (VITAMIN C) 500 MG CAPS Take by mouth 2 (two) times daily.   brimonidine (ALPHAGAN) 0.2 % ophthalmic solution Place 1 drop into both eyes daily.   cholecalciferol (VITAMIN D3) 25 MCG (1000 UT) tablet Take 1,000 Units by mouth daily.   EPINEPHrine 0.3 mg/0.3 mL IJ SOAJ injection Inject 0.3 mg into the muscle as needed for anaphylaxis.   Multiple Vitamin (MULTIVITAMIN) capsule Take 1 capsule by mouth daily.   mupirocin ointment (BACTROBAN) 2 % Apply 1 Application topically 2 (two) times daily.   silodosin (RAPAFLO) 4 MG CAPS capsule Take 1  capsule (4 mg total) by mouth daily with breakfast.   No facility-administered encounter medications on file as of 03/06/2023.     Lab Results  Component Value Date   WBC 4.4 07/21/2022  HGB 14.4 07/21/2022   HCT 42.6 07/21/2022   PLT 216.0 07/21/2022   GLUCOSE 90 03/01/2023   CHOL 151 03/01/2023   TRIG 58.0 03/01/2023   HDL 53.80 03/01/2023   LDLCALC 85 03/01/2023   ALT 6 03/01/2023   AST 19 03/01/2023   NA 137 03/01/2023   K 4.3 03/01/2023   CL 103 03/01/2023   CREATININE 1.00 03/01/2023   BUN 19 03/01/2023   CO2 27 03/01/2023   TSH 1.63 10/31/2022   PSA 6.18 (H) 12/26/2019    No results found.     Assessment & Plan:  Screening cholesterol level -     Lipid panel; Future  Aortic valve stenosis, etiology of cardiac valve disease unspecified Assessment & Plan: Dr Azucena Cecil - 08/11/22 - Aortic valve stenosis, repeat echo 07/2022 showed severe AS (DVI 0.17, AVA 0.5cm2, mean gradient 34 mmHg-likely underestimated, Vmax 3.7 m/s).  Currently declines any further evaluation.  No chest pain, sob.  Follow.    Orders: -     Hepatic function panel; Future -     Basic metabolic panel; Future -     CBC with Differential/Platelet; Future  Stress Assessment & Plan: Overall he feels he is handling things well.  Follow.       Dale [place], MD

## 2023-03-11 ENCOUNTER — Encounter: Payer: Self-pay | Admitting: Internal Medicine

## 2023-03-11 NOTE — Assessment & Plan Note (Signed)
Overall he feels he is handling things well.  Follow.   

## 2023-03-11 NOTE — Assessment & Plan Note (Signed)
Dr Azucena Cecil - 08/11/22 - Aortic valve stenosis, repeat echo 07/2022 showed severe AS (DVI 0.17, AVA 0.5cm2, mean gradient 34 mmHg-likely underestimated, Vmax 3.7 m/s).  Currently declines any further evaluation.  No chest pain, sob.  Follow.

## 2023-03-27 DIAGNOSIS — H40023 Open angle with borderline findings, high risk, bilateral: Secondary | ICD-10-CM | POA: Diagnosis not present

## 2023-03-27 DIAGNOSIS — H04123 Dry eye syndrome of bilateral lacrimal glands: Secondary | ICD-10-CM | POA: Diagnosis not present

## 2023-03-27 DIAGNOSIS — Z961 Presence of intraocular lens: Secondary | ICD-10-CM | POA: Diagnosis not present

## 2023-04-13 ENCOUNTER — Ambulatory Visit: Payer: Medicare HMO | Admitting: Dermatology

## 2023-07-05 ENCOUNTER — Other Ambulatory Visit (INDEPENDENT_AMBULATORY_CARE_PROVIDER_SITE_OTHER): Payer: Medicare HMO

## 2023-07-05 DIAGNOSIS — I350 Nonrheumatic aortic (valve) stenosis: Secondary | ICD-10-CM

## 2023-07-05 DIAGNOSIS — Z13220 Encounter for screening for lipoid disorders: Secondary | ICD-10-CM | POA: Diagnosis not present

## 2023-07-05 LAB — LIPID PANEL
Cholesterol: 146 mg/dL (ref 0–200)
HDL: 56.7 mg/dL (ref >39.00)
LDL Cholesterol: 80 mg/dL (ref 0–99)
NonHDL: 88.93
Total CHOL/HDL Ratio: 3
Triglycerides: 47 mg/dL (ref 0.0–149.0)
VLDL: 9.4 mg/dL (ref 0.0–40.0)

## 2023-07-05 LAB — CBC WITH DIFFERENTIAL/PLATELET
Basophils Absolute: 0.1 10*3/uL (ref 0.0–0.1)
Basophils Relative: 1.2 % (ref 0.0–3.0)
Eosinophils Absolute: 0.5 10*3/uL (ref 0.0–0.7)
Eosinophils Relative: 10.4 % — ABNORMAL HIGH (ref 0.0–5.0)
HCT: 41.8 % (ref 39.0–52.0)
Hemoglobin: 13.5 g/dL (ref 13.0–17.0)
Lymphocytes Relative: 33.5 % (ref 12.0–46.0)
Lymphs Abs: 1.5 10*3/uL (ref 0.7–4.0)
MCHC: 32.4 g/dL (ref 30.0–36.0)
MCV: 91.3 fL (ref 78.0–100.0)
Monocytes Absolute: 0.5 10*3/uL (ref 0.1–1.0)
Monocytes Relative: 10.6 % (ref 3.0–12.0)
Neutro Abs: 2 10*3/uL (ref 1.4–7.7)
Neutrophils Relative %: 44.3 % (ref 43.0–77.0)
Platelets: 210 10*3/uL (ref 150.0–400.0)
RBC: 4.58 Mil/uL (ref 4.22–5.81)
RDW: 14.6 % (ref 11.5–15.5)
WBC: 4.4 10*3/uL (ref 4.0–10.5)

## 2023-07-05 LAB — BASIC METABOLIC PANEL
BUN: 23 mg/dL (ref 6–23)
CO2: 25 meq/L (ref 19–32)
Calcium: 8.8 mg/dL (ref 8.4–10.5)
Chloride: 104 meq/L (ref 96–112)
Creatinine, Ser: 0.92 mg/dL (ref 0.40–1.50)
GFR: 73.63 mL/min (ref >60.00)
Glucose, Bld: 87 mg/dL (ref 70–99)
Potassium: 4.3 meq/L (ref 3.5–5.1)
Sodium: 138 meq/L (ref 135–145)

## 2023-07-05 LAB — HEPATIC FUNCTION PANEL
ALT: 7 U/L (ref 0–53)
AST: 20 U/L (ref 0–37)
Albumin: 3.8 g/dL (ref 3.5–5.2)
Alkaline Phosphatase: 61 U/L (ref 39–117)
Bilirubin, Direct: 0.1 mg/dL (ref 0.0–0.3)
Total Bilirubin: 0.7 mg/dL (ref 0.2–1.2)
Total Protein: 6.6 g/dL (ref 6.0–8.3)

## 2023-07-07 ENCOUNTER — Ambulatory Visit (INDEPENDENT_AMBULATORY_CARE_PROVIDER_SITE_OTHER): Payer: Medicare HMO | Admitting: Internal Medicine

## 2023-07-07 VITALS — BP 118/70 | HR 79 | Temp 97.9°F | Resp 16 | Ht 65.0 in | Wt 145.8 lb

## 2023-07-07 DIAGNOSIS — I350 Nonrheumatic aortic (valve) stenosis: Secondary | ICD-10-CM | POA: Diagnosis not present

## 2023-07-07 DIAGNOSIS — R361 Hematospermia: Secondary | ICD-10-CM | POA: Diagnosis not present

## 2023-07-07 DIAGNOSIS — F439 Reaction to severe stress, unspecified: Secondary | ICD-10-CM | POA: Diagnosis not present

## 2023-07-07 NOTE — Progress Notes (Signed)
Here for   Subjective:    Patient ID: Marvin Farley, male    DOB: 04-Jan-1932, 89 y.o.   MRN: [id number]  Patient here for  Chief Complaint  Patient presents with   Medical Management of Chronic Issues    HPI Here for a scheduled follow up. Follow up - aortic valve stenosis. Saw cardiology 08/11/22 - repeat echo 07/2022 showed severe AS. EF normal 55-60%. Right and left heart cath was recommended, but upon scheduling by staff, patient declined stating not wanting cardiac cath. He informed me that he desires no further intervention or testing regarding his heart valve. Discussed again today.  Continues to decline.  Denies any chest pain or sob reported. No increased cough or congestion.  No abdominal pain or bowel change.  Increased stress.  Family stress. Discussed.  Overall he feels he is handling things relatively well.  Reports he has noticed blood in his sperm.  Has noticed on a few occasions the last 2-3 months. Denies hematuria or rectal bleeding.    Past Medical History:  Diagnosis Date   Basal cell carcinoma 12/15/2022   Left ear sup helix - [facility]   BPH (benign prostatic hypertrophy)    Kidney stones    Left wrist fracture 09/05/1982   Osteoporosis    Right wrist fracture 09/05/1994   Past Surgical History:  Procedure Laterality Date   HERNIA REPAIR Bilateral    inguinal   TONSILLECTOMY     Family History  Problem Relation Age of Onset   Cancer Mother        leukemia   Heart disease Father        heart attack   Cancer Brother        Lymphoma   Social History   Socioeconomic History   Marital status: Married    Spouse name: Not on file   Number of children: Not on file   Years of education: Not on file   Highest education level: Not on file  Occupational History   Not on file  Tobacco Use   Smoking status: Never   Smokeless tobacco: Never  Vaping Use   Vaping status: Never Used  Substance and Sexual Activity   Alcohol use: No    Alcohol/week: 0.0 standard  drinks of alcohol   Drug use: No   Sexual activity: Not on file  Other Topics Concern   Not on file  Social History Narrative   Not on file   Social Determinants of Health   Financial Resource Strain: Low Risk  (09/08/2022)   Overall Financial Resource Strain (CARDIA)    Difficulty of Paying Living Expenses: Not hard at all  Food Insecurity: No Food Insecurity (09/08/2022)   Hunger Vital Sign    Worried About Running Out of Food in the Last Year: Never true    Ran Out of Food in the Last Year: Never true  Transportation Needs: No Transportation Needs (09/08/2022)   PRAPARE - Administrator, Civil Service (Medical): No    Lack of Transportation (Non-Medical): No  Physical Activity: Sufficiently Active (09/08/2022)   Exercise Vital Sign    Days of Exercise per Week: 5 days    Minutes of Exercise per Session: 30 min  Stress: No Stress Concern Present (09/08/2022)   [employer] of Occupational Health - Occupational Stress Questionnaire    Feeling of Stress : Not at all  Social Connections: Unknown (09/08/2022)   Social Connection and Isolation Panel [NHANES]    Frequency  of Communication with Friends and Family: Not on file    Frequency of Social Gatherings with Friends and Family: Not on file    Attends Religious Services: Not on file    Active Member of Clubs or Organizations: Not on file    Attends Banker Meetings: Not on file    Marital Status: Married     Review of Systems  Constitutional:  Negative for appetite change and unexpected weight change.  HENT:  Negative for congestion and sinus pressure.   Respiratory:  Negative for cough, chest tightness and shortness of breath.   Cardiovascular:  Negative for chest pain and palpitations.  Gastrointestinal:  Negative for abdominal pain, diarrhea, nausea and vomiting.  Genitourinary:  Negative for difficulty urinating and dysuria.  Musculoskeletal:  Negative for joint swelling and myalgias.  Skin:   Negative for color change and rash.  Neurological:  Negative for dizziness and headaches.  Psychiatric/Behavioral:  Negative for agitation and dysphoric mood.        Objective:     BP 118/70   Pulse 79   Temp 97.9 F (36.6 C)   Resp 16   Ht 5\' 5"  (1.651 m)   Wt 145 lb 12.8 oz (66.1 kg)   SpO2 98%   BMI 24.26 kg/m  Wt Readings from Last 3 Encounters:  07/07/23 145 lb 12.8 oz (66.1 kg)  03/06/23 146 lb 8 oz (66.5 kg)  11/03/22 148 lb 12.8 oz (67.5 kg)    Physical Exam Vitals reviewed.  Constitutional:      General: He is not in acute distress.    Appearance: Normal appearance. He is well-developed.  HENT:     Head: Normocephalic and atraumatic.     Right Ear: External ear normal.     Left Ear: External ear normal.  Eyes:     General: No scleral icterus.       Right eye: No discharge.        Left eye: No discharge.     Conjunctiva/sclera: Conjunctivae normal.  Cardiovascular:     Rate and Rhythm: Normal rate and regular rhythm.  Pulmonary:     Effort: Pulmonary effort is normal. No respiratory distress.     Breath sounds: Normal breath sounds.  Abdominal:     General: Bowel sounds are normal.     Palpations: Abdomen is soft.     Tenderness: There is no abdominal tenderness.  Musculoskeletal:        General: No swelling or tenderness.     Cervical back: Neck supple. No tenderness.  Lymphadenopathy:     Cervical: No cervical adenopathy.  Skin:    Findings: No erythema or rash.  Neurological:     Mental Status: He is alert.  Psychiatric:        Mood and Affect: Mood normal.        Behavior: Behavior normal.      Outpatient Encounter Medications as of 07/07/2023  Medication Sig   Ascorbic Acid (VITAMIN C) 500 MG CAPS Take by mouth 2 (two) times daily.   brimonidine (ALPHAGAN) 0.2 % ophthalmic solution Place 1 drop into both eyes daily.   cholecalciferol (VITAMIN D3) 25 MCG (1000 UT) tablet Take 1,000 Units by mouth daily.   EPINEPHrine 0.3 mg/0.3 mL IJ  SOAJ injection Inject 0.3 mg into the muscle as needed for anaphylaxis.   Multiple Vitamin (MULTIVITAMIN) capsule Take 1 capsule by mouth daily.   mupirocin ointment (BACTROBAN) 2 % Apply 1 Application topically 2 (two) times  daily.   silodosin (RAPAFLO) 4 MG CAPS capsule Take 1 capsule (4 mg total) by mouth daily with breakfast.   No facility-administered encounter medications on file as of 07/07/2023.     Lab Results  Component Value Date   WBC 4.4 07/05/2023   HGB 13.5 07/05/2023   HCT 41.8 07/05/2023   PLT 210.0 07/05/2023   GLUCOSE 87 07/05/2023   CHOL 146 07/05/2023   TRIG 47.0 07/05/2023   HDL 56.70 07/05/2023   LDLCALC 80 07/05/2023   ALT 7 07/05/2023   AST 20 07/05/2023   NA 138 07/05/2023   K 4.3 07/05/2023   CL 104 07/05/2023   CREATININE 0.92 07/05/2023   BUN 23 07/05/2023   CO2 25 07/05/2023   TSH 1.63 10/31/2022   PSA 6.18 (H) 12/26/2019       Assessment & Plan:  Hemospermia Assessment & Plan: Has noticed on a few occasions what he describes as blood in his sperm.  Check urinalysis.  Consider urology evaluation.   Orders: -     Urine Culture -     Urinalysis, Routine w reflex microscopic  Stress Assessment & Plan: Increased stress.  Discussed.  Does not feel needs any further intervention.  Follow.    Aortic valve stenosis, etiology of cardiac valve disease unspecified Assessment & Plan: Dr Azucena Cecil - 08/11/22 - Aortic valve stenosis, repeat echo 07/2022 showed severe AS (DVI 0.17, AVA 0.5cm2, mean gradient 34 mmHg-likely underestimated, Vmax 3.7 m/s).  Currently declines any further evaluation.  No chest pain, sob.  Follow.        Dale [place], MD

## 2023-07-09 ENCOUNTER — Encounter: Payer: Self-pay | Admitting: Internal Medicine

## 2023-07-09 LAB — URINALYSIS, ROUTINE W REFLEX MICROSCOPIC
Bilirubin Urine: NEGATIVE
Glucose, UA: NEGATIVE
Hgb urine dipstick: NEGATIVE
Ketones, ur: NEGATIVE
Leukocytes,Ua: NEGATIVE
Nitrite: NEGATIVE
Protein, ur: NEGATIVE
Specific Gravity, Urine: 1.022 (ref 1.001–1.035)
pH: 5.5 (ref 5.0–8.0)

## 2023-07-09 LAB — URINE CULTURE
MICRO NUMBER:: 15675848
Result:: NO GROWTH
SPECIMEN QUALITY:: ADEQUATE

## 2023-07-09 NOTE — Assessment & Plan Note (Signed)
Increased stress.  Discussed.  Does not feel needs any further intervention.  Follow.   

## 2023-07-09 NOTE — Assessment & Plan Note (Signed)
Dr Azucena Cecil - 08/11/22 - Aortic valve stenosis, repeat echo 07/2022 showed severe AS (DVI 0.17, AVA 0.5cm2, mean gradient 34 mmHg-likely underestimated, Vmax 3.7 m/s).  Currently declines any further evaluation.  No chest pain, sob.  Follow.

## 2023-07-09 NOTE — Assessment & Plan Note (Signed)
Has noticed on a few occasions what he describes as blood in his sperm.  Check urinalysis.  Consider urology evaluation.

## 2023-07-10 ENCOUNTER — Telehealth: Payer: Self-pay

## 2023-07-10 NOTE — Telephone Encounter (Signed)
Lvm for pt to give office a call back. See msg below

## 2023-07-10 NOTE — Telephone Encounter (Signed)
-----   Message from Sherwood sent at 07/09/2023 10:13 PM EST ----- Notify - urinalysis is negative for blood.  Urine culture negative.  Given he has had a few episodes of blood in sperm, I would like to refer him to urology for further evaluation and w/up.

## 2023-09-11 ENCOUNTER — Ambulatory Visit (INDEPENDENT_AMBULATORY_CARE_PROVIDER_SITE_OTHER): Payer: Medicare HMO

## 2023-09-11 VITALS — Ht 65.0 in | Wt 145.0 lb

## 2023-09-11 DIAGNOSIS — Z0000 Encounter for general adult medical examination without abnormal findings: Secondary | ICD-10-CM

## 2023-09-11 NOTE — Progress Notes (Signed)
 Subjective:   Marvin Farley is a 89 y.o. male who presents for Medicare Annual/Subsequent preventive examination.  Visit Complete: Virtual I connected with  Marvin Farley on 09/11/23 by a audio enabled telemedicine application and verified that I am speaking with the correct person using two identifiers.  Patient Location: Home  Provider Location: Office/Clinic  I discussed the limitations of evaluation and management by telemedicine. The patient expressed understanding and agreed to proceed.  Vital Signs: Because this visit was a virtual/telehealth visit, some criteria may be missing or patient reported. Any vitals not documented were not able to be obtained and vitals that have been documented are patient reported.  Patient Medicare AWV questionnaire was completed by the patient on (not done); I have confirmed that all information answered by patient is correct and no changes since this date.  Cardiac Risk Factors include: advanced age (>5men, >71 women);male gender     Objective:    Today's Vitals   09/11/23 1101  Weight: 145 lb (65.8 kg)  Height: 5' 5 (1.651 m)   Body mass index is 24.13 kg/m.     09/11/2023   11:10 AM 09/08/2022    2:41 PM 05/17/2022    1:03 PM 01/21/2022    1:03 PM 11/11/2021    5:20 PM 08/27/2021    9:48 AM 08/26/2020   10:43 AM  Advanced Directives  Does Patient Have a Medical Advance Directive? No No No Yes No No No  Type of Advance Directive    Living will     Does patient want to make changes to medical advance directive?    No - Patient declined     Would patient like information on creating a medical advance directive?  No - Patient declined  No - Patient declined  No - Patient declined No - Patient declined    Current Medications (verified) Outpatient Encounter Medications as of 09/11/2023  Medication Sig   Ascorbic Acid (VITAMIN C) 500 MG CAPS Take by mouth 2 (two) times daily.   brimonidine (ALPHAGAN) 0.2 % ophthalmic solution Place 1  drop into both eyes daily.   cholecalciferol (VITAMIN D3) 25 MCG (1000 UT) tablet Take 1,000 Units by mouth daily.   EPINEPHrine  0.3 mg/0.3 mL IJ SOAJ injection Inject 0.3 mg into the muscle as needed for anaphylaxis.   Multiple Vitamin (MULTIVITAMIN) capsule Take 1 capsule by mouth daily.   silodosin  (RAPAFLO ) 4 MG CAPS capsule Take 1 capsule (4 mg total) by mouth daily with breakfast.   mupirocin  ointment (BACTROBAN ) 2 % Apply 1 Application topically 2 (two) times daily. (Patient not taking: Reported on 09/11/2023)   No facility-administered encounter medications on file as of 09/11/2023.    Allergies (verified) Bee venom and Codeine sulfate   History: Past Medical History:  Diagnosis Date   Basal cell carcinoma 12/15/2022   Left ear sup helix - EDC   BPH (benign prostatic hypertrophy)    Kidney stones    Left wrist fracture 09/05/1982   Osteoporosis    Right wrist fracture 09/05/1994   Past Surgical History:  Procedure Laterality Date   HERNIA REPAIR Bilateral    inguinal   TONSILLECTOMY     Family History  Problem Relation Age of Onset   Cancer Mother        leukemia   Heart disease Father        heart attack   Cancer Brother        Lymphoma   Social History   Socioeconomic  History   Marital status: Married    Spouse name: Not on file   Number of children: Not on file   Years of education: Not on file   Highest education level: Not on file  Occupational History   Not on file  Tobacco Use   Smoking status: Never   Smokeless tobacco: Never  Vaping Use   Vaping status: Never Used  Substance and Sexual Activity   Alcohol use: No    Alcohol/week: 0.0 standard drinks of alcohol   Drug use: No   Sexual activity: Not on file  Other Topics Concern   Not on file  Social History Narrative   Not on file   Social Drivers of Health   Financial Resource Strain: Low Risk  (09/11/2023)   Overall Financial Resource Strain (CARDIA)    Difficulty of Paying Living  Expenses: Not hard at all  Food Insecurity: No Food Insecurity (09/11/2023)   Hunger Vital Sign    Worried About Running Out of Food in the Last Year: Never true    Ran Out of Food in the Last Year: Never true  Transportation Needs: No Transportation Needs (09/11/2023)   PRAPARE - Administrator, Civil Service (Medical): No    Lack of Transportation (Non-Medical): No  Physical Activity: Sufficiently Active (09/11/2023)   Exercise Vital Sign    Days of Exercise per Week: 5 days    Minutes of Exercise per Session: 30 min  Stress: No Stress Concern Present (09/11/2023)   [employer] of Occupational Health - Occupational Stress Questionnaire    Feeling of Stress : Not at all  Social Connections: Moderately Integrated (09/11/2023)   Social Connection and Isolation Panel [NHANES]    Frequency of Communication with Friends and Family: More than three times a week    Frequency of Social Gatherings with Friends and Family: More than three times a week    Attends Religious Services: More than 4 times per year    Active Member of [employer] or Organizations: No    Attends Engineer, Structural: Never    Marital Status: Married    Tobacco Counseling Counseling given: Not Answered  Clinical Intake:  Pre-visit preparation completed: No  Pain : No/denies pain   BMI - recorded: 24.13 Nutritional Status: BMI of 19-24  Normal Nutritional Risks: None Diabetes: No  How often do you need to have someone help you when you read instructions, pamphlets, or other written materials from your doctor or pharmacy?: 1 - Never  Interpreter Needed?: No  Comments: lives with wife Information entered by :: B.Eleuterio Dollar,LPN   Activities of Daily Living    09/11/2023   11:11 AM  In your present state of health, do you have any difficulty performing the following activities:  Hearing? 1  Vision? 0  Difficulty concentrating or making decisions? 0  Walking or climbing stairs? 0  Dressing or  bathing? 0  Doing errands, shopping? 0  Preparing Food and eating ? N  Using the Toilet? N  In the past six months, have you accidently leaked urine? N  Do you have problems with loss of bowel control? N  Managing your Medications? N  Managing your Finances? N  Housekeeping or managing your Housekeeping? N    Patient Care Team: Glendia Shad, MD as PCP - General (Internal Medicine)  Indicate any recent Medical Services you may have received from other than [facility] providers in the past year (date may be approximate).     Assessment:  This is a routine wellness examination for [place].  Hearing/Vision screen Hearing Screening - Comments:: Pt says his hearing is little less Vision Screening - Comments:: Pt says his vision is good with glasses Dr Mevelyn   Goals Addressed               This Visit's Progress     COMPLETED: DIET - INCREASE WATER INTAKE   On track     Stay hydrated      Maintain healthy lifestyle   On track     09/11/23 keep goal: Stay hydrated Stay active Healthy diet      COMPLETED: Maintain weight (pt-stated)   On track      Depression Screen    09/11/2023   11:07 AM 11/03/2022   10:10 AM 09/08/2022    2:42 PM 07/21/2022   11:39 AM 02/09/2022    1:04 PM 12/30/2021    9:29 AM 08/27/2021    9:47 AM  PHQ 2/9 Scores  PHQ - 2 Score 0 0 0 0 0 0 0    Fall Risk    09/11/2023   11:04 AM 11/03/2022   10:10 AM 09/08/2022    2:42 PM 07/21/2022   11:39 AM 02/09/2022    1:04 PM  Fall Risk   Falls in the past year? 0 0 0 0 1  Number falls in past yr: 0 0 0 0 0  Injury with Fall? 0 0 0 0 0  Risk for fall due to : No Fall Risks No Fall Risks  No Fall Risks History of fall(s)  Follow up Education provided;Falls prevention discussed Falls evaluation completed Falls evaluation completed;Falls prevention discussed Falls evaluation completed Falls evaluation completed    MEDICARE RISK AT HOME: Medicare Risk at Home Any stairs in or around the home?: Yes If so,  are there any without handrails?: Yes Home free of loose throw rugs in walkways, pet beds, electrical cords, etc?: Yes Adequate lighting in your home to reduce risk of falls?: Yes Life alert?: No Use of a cane, walker or w/c?: No Grab bars in the bathroom?: No Shower chair or bench in shower?: No Elevated toilet seat or a handicapped toilet?: No  TIMED UP AND GO:  Was the test performed?  No    Cognitive Function:        09/11/2023   11:12 AM 09/08/2022    3:34 PM 08/26/2020   11:27 AM 08/26/2019   10:53 AM 08/21/2018    8:33 AM  6CIT Screen  What Year? 0 points 0 points 0 points 0 points 0 points  What month? 0 points 0 points 0 points 0 points 0 points  What time? 0 points 0 points 0 points 0 points 0 points  Count back from 20 0 points 0 points 0 points 0 points 0 points  Months in reverse 0 points 0 points 0 points 0 points 0 points  Repeat phrase 8 points  0 points 0 points 0 points  Total Score 8 points  0 points 0 points 0 points    Immunizations Immunization History  Administered Date(s) Administered   Moderna Sars-Covid-2 Vaccination 10/30/2019, 11/27/2019   Pneumococcal Conjugate-13 04/03/2014   Pneumococcal Polysaccharide-23 10/14/2016   Tdap 11/17/2010    TDAP status: Up to date  Flu Vaccine status: Declined, Education has been provided regarding the importance of this vaccine but patient still declined. Advised may receive this vaccine at local pharmacy or Health Dept. Aware to provide a copy of the vaccination record  if obtained from local pharmacy or Health Dept. Verbalized acceptance and understanding.  Pneumococcal vaccine status: Up to date  Covid-19 vaccine status: Completed vaccines  Qualifies for Shingles Vaccine? Yes   Zostavax completed No   Shingrix Completed?: No.    Education has been provided regarding the importance of this vaccine. Patient has been advised to call insurance company to determine out of pocket expense if they have not yet  received this vaccine. Advised may also receive vaccine at local pharmacy or Health Dept. Verbalized acceptance and understanding.  Screening Tests Health Maintenance  Topic Date Due   COVID-19 Vaccine (3 - Moderna risk series) 09/27/2023 (Originally 12/25/2019)   Zoster Vaccines- Shingrix (1 of 2) 10/07/2023 (Originally 10/03/1950)   INFLUENZA VACCINE  12/04/2023 (Originally 04/06/2023)   DTaP/Tdap/Td (2 - Td or Tdap) 07/06/2024 (Originally 11/16/2020)   Medicare Annual Wellness (AWV)  09/10/2024   Pneumonia Vaccine 49+ Years old  Completed   HPV VACCINES  Aged Out    Health Maintenance  There are no preventive care reminders to display for this patient.  Colorectal cancer screening: No longer required.   Lung Cancer Screening: (Low Dose CT Chest recommended if Age 70-80 years, 20 pack-year currently smoking OR have quit w/in 15years.) does not qualify.   Lung Cancer Screening Referral: no  Additional Screening:  Hepatitis C Screening: does not qualify; Completed no  Vision Screening: Recommended annual ophthalmology exams for early detection of glaucoma and other disorders of the eye. Is the patient up to date with their annual eye exam?  Yes  Who is the provider or what is the name of the office in which the patient attends annual eye exams? Dr Mevelyn If pt is not established with a provider, would they like to be referred to a provider to establish care? No .   Dental Screening: Recommended annual dental exams for proper oral hygiene  Diabetic Foot Exam: n/a  Community Resource Referral / Chronic Care Management: CRR required this visit?  No   CCM required this visit?  No    Plan:     I have personally reviewed and noted the following in the patient's chart:   Medical and social history Use of alcohol, tobacco or illicit drugs  Current medications and supplements including opioid prescriptions. Patient is not currently taking opioid prescriptions. Functional ability  and status Nutritional status Physical activity Advanced directives List of other physicians Hospitalizations, surgeries, and ER visits in previous 12 months Vitals Screenings to include cognitive, depression, and falls Referrals and appointments  In addition, I have reviewed and discussed with patient certain preventive protocols, quality metrics, and best practice recommendations. A written personalized care plan for preventive services as well as general preventive health recommendations were provided to patient.    Erminio LITTIE Saris, LPN   04/07/7973   After Visit Summary: (Declined) Due to this being a telephonic visit, with patients personalized plan was offered to patient but patient Declined AVS at this time   Nurse Notes: Pt is doing well. He states he is still working 5 days a week 5 hours.

## 2023-09-11 NOTE — Patient Instructions (Signed)
 Marvin Farley , Thank you for taking time to come for your Medicare Wellness Visit. I appreciate your ongoing commitment to your health goals. Please review the following plan we discussed and let me know if I can assist you in the future.   Referrals/Orders/Follow-Ups/Clinician Recommendations: none  This is a list of the screening recommended for you and due dates:  Health Maintenance  Topic Date Due   COVID-19 Vaccine (3 - Moderna risk series) 12/25/2019   Zoster (Shingles) Vaccine (1 of 2) 10/07/2023*   Flu Shot  12/04/2023*   DTaP/Tdap/Td vaccine (2 - Td or Tdap) 07/06/2024*   Medicare Annual Wellness Visit  09/10/2024   Pneumonia Vaccine  Completed   HPV Vaccine  Aged Out  *Topic was postponed. The date shown is not the original due date.    Advanced directives: (Declined) Advance directive discussed with you today. Even though you declined this today, please call our office should you change your mind, and we can give you the proper paperwork for you to fill out.  Next Medicare Annual Wellness Visit scheduled for next year: Yes 09/11/2024 @ 1PM televisit

## 2023-10-23 ENCOUNTER — Telehealth: Payer: Self-pay | Admitting: Internal Medicine

## 2023-10-23 DIAGNOSIS — E871 Hypo-osmolality and hyponatremia: Secondary | ICD-10-CM

## 2023-10-23 DIAGNOSIS — I350 Nonrheumatic aortic (valve) stenosis: Secondary | ICD-10-CM

## 2023-10-23 DIAGNOSIS — E559 Vitamin D deficiency, unspecified: Secondary | ICD-10-CM

## 2023-10-23 NOTE — Telephone Encounter (Signed)
Orders placed for future labs.  

## 2023-10-23 NOTE — Telephone Encounter (Signed)
 Patient need lab orders.

## 2023-10-31 ENCOUNTER — Other Ambulatory Visit (INDEPENDENT_AMBULATORY_CARE_PROVIDER_SITE_OTHER): Payer: HMO

## 2023-10-31 DIAGNOSIS — E559 Vitamin D deficiency, unspecified: Secondary | ICD-10-CM | POA: Diagnosis not present

## 2023-10-31 DIAGNOSIS — E871 Hypo-osmolality and hyponatremia: Secondary | ICD-10-CM | POA: Diagnosis not present

## 2023-10-31 DIAGNOSIS — I350 Nonrheumatic aortic (valve) stenosis: Secondary | ICD-10-CM

## 2023-10-31 LAB — LIPID PANEL
Cholesterol: 166 mg/dL (ref 0–200)
HDL: 55.6 mg/dL (ref >39.00)
LDL Cholesterol: 97 mg/dL (ref 0–99)
NonHDL: 110.84
Total CHOL/HDL Ratio: 3
Triglycerides: 71 mg/dL (ref 0.0–149.0)
VLDL: 14.2 mg/dL (ref 0.0–40.0)

## 2023-10-31 LAB — BASIC METABOLIC PANEL
BUN: 20 mg/dL (ref 6–23)
CO2: 27 meq/L (ref 19–32)
Calcium: 9 mg/dL (ref 8.4–10.5)
Chloride: 103 meq/L (ref 96–112)
Creatinine, Ser: 1.03 mg/dL (ref 0.40–1.50)
GFR: 64.15 mL/min (ref >60.00)
Glucose, Bld: 98 mg/dL (ref 70–99)
Potassium: 4.7 meq/L (ref 3.5–5.1)
Sodium: 138 meq/L (ref 135–145)

## 2023-10-31 LAB — HEPATIC FUNCTION PANEL
ALT: 6 U/L (ref 0–53)
AST: 20 U/L (ref 0–37)
Albumin: 4.1 g/dL (ref 3.5–5.2)
Alkaline Phosphatase: 68 U/L (ref 39–117)
Bilirubin, Direct: 0.1 mg/dL (ref 0.0–0.3)
Total Bilirubin: 0.7 mg/dL (ref 0.2–1.2)
Total Protein: 6.9 g/dL (ref 6.0–8.3)

## 2023-10-31 LAB — VITAMIN D 25 HYDROXY (VIT D DEFICIENCY, FRACTURES): VITD: 25.28 ng/mL — ABNORMAL LOW (ref 30.00–100.00)

## 2023-10-31 LAB — TSH: TSH: 1.8 u[IU]/mL (ref 0.35–5.50)

## 2023-11-06 ENCOUNTER — Ambulatory Visit: Payer: Medicare HMO | Admitting: Internal Medicine

## 2023-11-06 ENCOUNTER — Encounter: Payer: Self-pay | Admitting: Internal Medicine

## 2023-11-06 NOTE — Progress Notes (Signed)
 Subjective:    Patient ID: Marvin Farley, male    DOB: 04-14-1932, 89 y.o.   MRN: [id number]  Patient here for No chief complaint on file.   HPI Did not show for appt.  Reviewed chart prior to visit - Follow up - aortic valve stenosis. Saw cardiology 08/11/22 - repeat echo 07/2022 showed severe AS. EF normal 55-60%. Right and left heart cath was recommended. He has declined further w/up or intervention for his heart valve.  Last visit, reported noticing blood in his sperm. Had wanted to refer to urology. Declined. Urine negative for blood.    Past Medical History:  Diagnosis Date   Basal cell carcinoma 12/15/2022   Left ear sup helix - EDC   BPH (benign prostatic hypertrophy)    Kidney stones    Left wrist fracture 09/05/1982   Osteoporosis    Right wrist fracture 09/05/1994   Past Surgical History:  Procedure Laterality Date   HERNIA REPAIR Bilateral    inguinal   TONSILLECTOMY     Family History  Problem Relation Age of Onset   Cancer Mother        leukemia   Heart disease Father        heart attack   Cancer Brother        Lymphoma   Social History   Socioeconomic History   Marital status: Married    Spouse name: Not on file   Number of children: Not on file   Years of education: Not on file   Highest education level: Not on file  Occupational History   Not on file  Tobacco Use   Smoking status: Never   Smokeless tobacco: Never  Vaping Use   Vaping status: Never Used  Substance and Sexual Activity   Alcohol use: No    Alcohol/week: 0.0 standard drinks of alcohol   Drug use: No   Sexual activity: Not on file  Other Topics Concern   Not on file  Social History Narrative   Not on file   Social Drivers of Health   Financial Resource Strain: Low Risk  (09/11/2023)   Overall Financial Resource Strain (CARDIA)    Difficulty of Paying Living Expenses: Not hard at all  Food Insecurity: No Food Insecurity (09/11/2023)   Hunger Vital Sign    Worried About  Running Out of Food in the Last Year: Never true    Ran Out of Food in the Last Year: Never true  Transportation Needs: No Transportation Needs (09/11/2023)   PRAPARE - Administrator, Civil Service (Medical): No    Lack of Transportation (Non-Medical): No  Physical Activity: Sufficiently Active (09/11/2023)   Exercise Vital Sign    Days of Exercise per Week: 5 days    Minutes of Exercise per Session: 30 min  Stress: No Stress Concern Present (09/11/2023)   [employer] of Occupational Health - Occupational Stress Questionnaire    Feeling of Stress : Not at all  Social Connections: Moderately Integrated (09/11/2023)   Social Connection and Isolation Panel [NHANES]    Frequency of Communication with Friends and Family: More than three times a week    Frequency of Social Gatherings with Friends and Family: More than three times a week    Attends Religious Services: More than 4 times per year    Active Member of [employer] or Organizations: No    Attends Banker Meetings: Never    Marital Status: Married     Review  of Systems     Objective:     There were no vitals taken for this visit. Wt Readings from Last 3 Encounters:  09/11/23 145 lb (65.8 kg)  07/07/23 145 lb 12.8 oz (66.1 kg)  03/06/23 146 lb 8 oz (66.5 kg)    Physical Exam      Outpatient Encounter Medications as of 11/06/2023  Medication Sig   Ascorbic Acid (VITAMIN C) 500 MG CAPS Take by mouth 2 (two) times daily.   brimonidine (ALPHAGAN) 0.2 % ophthalmic solution Place 1 drop into both eyes daily.   cholecalciferol (VITAMIN D3) 25 MCG (1000 UT) tablet Take 1,000 Units by mouth daily.   EPINEPHrine 0.3 mg/0.3 mL IJ SOAJ injection Inject 0.3 mg into the muscle as needed for anaphylaxis.   Multiple Vitamin (MULTIVITAMIN) capsule Take 1 capsule by mouth daily.   mupirocin ointment (BACTROBAN) 2 % Apply 1 Application topically 2 (two) times daily. (Patient not taking: Reported on 09/11/2023)    silodosin (RAPAFLO) 4 MG CAPS capsule Take 1 capsule (4 mg total) by mouth daily with breakfast.   No facility-administered encounter medications on file as of 11/06/2023.     Lab Results  Component Value Date   WBC 4.4 07/05/2023   HGB 13.5 07/05/2023   HCT 41.8 07/05/2023   PLT 210.0 07/05/2023   GLUCOSE 98 10/31/2023   CHOL 166 10/31/2023   TRIG 71.0 10/31/2023   HDL 55.60 10/31/2023   LDLCALC 97 10/31/2023   ALT 6 10/31/2023   AST 20 10/31/2023   NA 138 10/31/2023   K 4.7 10/31/2023   CL 103 10/31/2023   CREATININE 1.03 10/31/2023   BUN 20 10/31/2023   CO2 27 10/31/2023   TSH 1.80 10/31/2023   PSA 6.18 (H) 12/26/2019    No results found.     Assessment & Plan:  There are no diagnoses linked to this encounter.   Dale [place], MD

## 2023-11-21 ENCOUNTER — Ambulatory Visit
Admission: RE | Admit: 2023-11-21 | Discharge: 2023-11-21 | Disposition: A | Discharge status: Home / Self Care | Source: Ambulatory Visit | Attending: Nurse Practitioner | Admitting: *Deleted

## 2023-11-21 ENCOUNTER — Ambulatory Visit (INDEPENDENT_AMBULATORY_CARE_PROVIDER_SITE_OTHER): Admitting: Nurse Practitioner

## 2023-11-21 ENCOUNTER — Ambulatory Visit
Admission: RE | Admit: 2023-11-21 | Discharge: 2023-11-21 | Disposition: A | Discharge status: Home / Self Care | Source: Ambulatory Visit | Attending: Nurse Practitioner | Admitting: Nurse Practitioner

## 2023-11-21 ENCOUNTER — Encounter: Payer: Self-pay | Admitting: Nurse Practitioner

## 2023-11-21 VITALS — BP 110/64 | HR 75 | Temp 98.1°F | Ht 65.0 in | Wt 147.2 lb

## 2023-11-21 DIAGNOSIS — M25512 Pain in left shoulder: Secondary | ICD-10-CM | POA: Diagnosis not present

## 2023-11-21 DIAGNOSIS — M19012 Primary osteoarthritis, left shoulder: Secondary | ICD-10-CM | POA: Diagnosis not present

## 2023-11-21 DIAGNOSIS — M778 Other enthesopathies, not elsewhere classified: Secondary | ICD-10-CM | POA: Diagnosis not present

## 2023-11-21 DIAGNOSIS — S43002A Unspecified subluxation of left shoulder joint, initial encounter: Secondary | ICD-10-CM | POA: Diagnosis not present

## 2023-11-21 NOTE — Assessment & Plan Note (Signed)
 Left shoulder pain with limited range of motion and possible partial muscle tear. An x-ray is necessary before considering an MRI. Order an x-ray of the left shoulder. Advise alternating ice and heat therapy every 20 minutes. Recommend topical analgesics such as [employer] or Aspercreme . Consider pain relief patches like Salonpas. Review x-ray results and communicate findings. Determine the need for an MRI or orthopedic referral based on x-ray results. Further work up pending.

## 2023-11-21 NOTE — Progress Notes (Signed)
 Bethanie Dicker, NP-C Phone: [phone]  Marvin Farley is a 89 y.o. male who presents today for left shoulder pain.   Discussed the use of AI scribe software for clinical note transcription with the patient, who gave verbal consent to proceed.  History of Present Illness   Marvin Farley "Marvin Farley" is a 89 year old male who presents with left shoulder pain. He is accompanied by his wife.  He has been experiencing left shoulder pain for approximately three months, significant enough to disrupt sleep. The pain is sometimes alleviated by lying on the affected side. He describes an inability to raise his arm above shoulder height, stating, 'If I go, try to go any higher, it just won't, it won't go.' No known injury to the shoulder and no imaging studies have been performed yet.  A chiropractor suggested a possible partial muscle tear and attempted treatment with a machine that stimulates stem cells, which provided minimal relief. The chiropractor recommended further evaluation by a primary care doctor and suggested obtaining an MRI.  Strength in the shoulder seems adequate for holding objects like a cup, but there is a cessation of function when attempting to lift the arm above shoulder height. No pain when pushing down with the arm, but pain is noted when the arm is pushed upwards or when pressure is applied during certain movements. He has not been using any topical treatments or medications for the shoulder pain. He works two jobs, which limits his time for applying ice or heat therapy.      Social History   Tobacco Use  Smoking Status Never  Smokeless Tobacco Never    Current Outpatient Medications on File Prior to Visit  Medication Sig Dispense Refill   Ascorbic Acid (VITAMIN C) 500 MG CAPS Take by mouth 2 (two) times daily.     brimonidine (ALPHAGAN) 0.2 % ophthalmic solution Place 1 drop into both eyes daily.     cholecalciferol (VITAMIN D3) 25 MCG (1000 UT) tablet Take 1,000 Units  by mouth daily.     EPINEPHrine 0.3 mg/0.3 mL IJ SOAJ injection Inject 0.3 mg into the muscle as needed for anaphylaxis. 2 each 0   Multiple Vitamin (MULTIVITAMIN) capsule Take 1 capsule by mouth daily.     mupirocin ointment (BACTROBAN) 2 % Apply 1 Application topically 2 (two) times daily. (Patient not taking: Reported on 09/11/2023) 22 g 0   silodosin (RAPAFLO) 4 MG CAPS capsule Take 1 capsule (4 mg total) by mouth daily with breakfast. 90 capsule 3   No current facility-administered medications on file prior to visit.     ROS see history of present illness  Objective  Physical Exam Vitals:   11/21/23 1437  BP: 110/64  Pulse: 75  Temp: 98.1 F (36.7 C)  SpO2: 97%    BP Readings from Last 3 Encounters:  11/21/23 110/64  07/07/23 118/70  03/06/23 112/82   Wt Readings from Last 3 Encounters:  11/21/23 147 lb 3.2 oz (66.8 kg)  09/11/23 145 lb (65.8 kg)  07/07/23 145 lb 12.8 oz (66.1 kg)    Physical Exam Constitutional:      General: He is not in acute distress.    Appearance: Normal appearance.  HENT:     Head: Normocephalic.  Cardiovascular:     Rate and Rhythm: Normal rate and regular rhythm.     Heart sounds: Normal heart sounds.  Pulmonary:     Effort: Pulmonary effort is normal.     Breath sounds: Normal breath  sounds.  Musculoskeletal:     Right shoulder: Normal.     Left shoulder: Tenderness (posterior joint) and crepitus present. Decreased range of motion (limited by pain, cannot raise arm above shoulder height). Normal strength.  Skin:    General: Skin is warm and dry.  Neurological:     General: No focal deficit present.     Mental Status: He is alert.  Psychiatric:        Mood and Affect: Mood normal.        Behavior: Behavior normal.     Assessment/Plan: Please see individual problem list.  Acute pain of left shoulder Assessment & Plan: Left shoulder pain with limited range of motion and possible partial muscle tear. An x-ray is necessary  before considering an MRI. Order an x-ray of the left shoulder. Advise alternating ice and heat therapy every 20 minutes. Recommend topical analgesics such as [employer] or Aspercreme . Consider pain relief patches like Salonpas. Review x-ray results and communicate findings. Determine the need for an MRI or orthopedic referral based on x-ray results. Further work up pending.   Orders: -     DG Shoulder Left; Future     Return if symptoms worsen or fail to improve.   Bethanie Dicker, NP-C [facility]

## 2023-11-27 ENCOUNTER — Telehealth: Payer: Self-pay

## 2023-11-27 NOTE — Telephone Encounter (Signed)
 Pt saw Kacy. Looks like x-ray is not back yet.

## 2023-11-27 NOTE — Telephone Encounter (Signed)
 Copied from CRM [id number]. Topic: Clinical - Lab/Test Results >> Nov 27, 2023  9:29 AM Marvin Farley wrote: Reason for CRM: Patient called in to follow up on xray results from last week. Please call [phone]

## 2023-11-27 NOTE — Telephone Encounter (Signed)
 Called radiology to switch to stat and informed pt that when we receive the results we will reach out to him

## 2023-11-28 ENCOUNTER — Other Ambulatory Visit: Payer: Self-pay | Admitting: Nurse Practitioner

## 2023-11-28 DIAGNOSIS — M75102 Unspecified rotator cuff tear or rupture of left shoulder, not specified as traumatic: Secondary | ICD-10-CM

## 2023-11-28 DIAGNOSIS — M25512 Pain in left shoulder: Secondary | ICD-10-CM

## 2023-11-30 ENCOUNTER — Telehealth: Payer: Self-pay

## 2023-11-30 NOTE — Telephone Encounter (Signed)
 Pt saw Arville Lime for this.

## 2023-11-30 NOTE — Telephone Encounter (Signed)
 Copied from CRM [id number]. Topic: General - Other >> Nov 30, 2023 10:17 AM Arley Phenix D wrote: Reason for CRM: Patient stated that he was supposed to receive a call or message regarding an update on the request for an orthopedic doctor. Patient just wants to know the status of that request.

## 2023-11-30 NOTE — Telephone Encounter (Signed)
(  E2C2 Please relay message below to pt)     Detailed VM left informing pt that referral was placed to [facility] on 11-28-23 and that it takes time for the facilities to go through their referrals but that they will reach out to him, I informed him that on our last phone call to give [place] a call back if he has not heard from anyone in a  week and a half then we can provide him with the contact information to the facility he was referred to.  [facility] [address]  [phone]

## 2024-01-23 ENCOUNTER — Ambulatory Visit (INDEPENDENT_AMBULATORY_CARE_PROVIDER_SITE_OTHER): Admitting: Internal Medicine

## 2024-01-23 ENCOUNTER — Encounter: Payer: Self-pay | Admitting: Internal Medicine

## 2024-01-23 VITALS — BP 108/68 | HR 77 | Temp 98.0°F | Resp 16 | Ht 65.0 in | Wt 147.0 lb

## 2024-01-23 DIAGNOSIS — E871 Hypo-osmolality and hyponatremia: Secondary | ICD-10-CM | POA: Diagnosis not present

## 2024-01-23 DIAGNOSIS — R42 Dizziness and giddiness: Secondary | ICD-10-CM | POA: Diagnosis not present

## 2024-01-23 DIAGNOSIS — R972 Elevated prostate specific antigen [PSA]: Secondary | ICD-10-CM

## 2024-01-23 DIAGNOSIS — N400 Enlarged prostate without lower urinary tract symptoms: Secondary | ICD-10-CM | POA: Diagnosis not present

## 2024-01-23 DIAGNOSIS — I350 Nonrheumatic aortic (valve) stenosis: Secondary | ICD-10-CM

## 2024-01-23 MED ORDER — SILODOSIN 4 MG PO CAPS: 4.0000 mg | ORAL_CAPSULE | Freq: Every day | ORAL | 3 refills | Status: DC

## 2024-01-23 NOTE — Progress Notes (Signed)
 Subjective:    Patient ID: Marvin Farley, male    DOB: 1931-12-24, 89 y.o.   MRN: [id number]  Patient here for  Chief Complaint  Patient presents with   Medical Management of Chronic Issues    HPI Here for a scheduled follow up - follow up regarding hypercholesterolemia, hypertension and AS. Saw cardiology 08/11/22 - repeat echo 07/2022 showed severe AS. EF normal 55-60%. Right and left heart cath was recommended, but upon scheduling by staff, patient declined stating not wanting cardiac cath. He informed me that he desires no further intervention or testing regarding his heart valve. Discussed again today.  Continues to decline. Recently evaluated for left shoulder pain. Xray with superior subluxation of the humeral head suggesting rotator cuff tear. Mild arthritis. Recommended referral to ortho. He cancelled his ortho appt. Denies any pain. Has been working his shoulder. Still with limited rom above 90 degrees, but desires no futher interventin. Discussed PT. No chest pain or sob. No abdominal pain. Bowels stable. Does report some occasional dizziness with changing positions. Does not occur frequently. Goes away quickly. 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    Past Medical History:  Diagnosis Date   Basal cell carcinoma 12/15/2022   Left ear sup helix - [facility]   BPH (benign prostatic hypertrophy)    Kidney stones    Left wrist fracture 09/05/1982   Osteoporosis    Right wrist fracture 09/05/1994   Past Surgical History:  Procedure Laterality Date   HERNIA REPAIR Bilateral    inguinal   TONSILLECTOMY     Family History  Problem Relation Age of Onset   Cancer Mother        leukemia   Heart disease Father        heart attack   Cancer Brother        Lymphoma   Social History   Socioeconomic History   Marital status: Married    Spouse name: Not on file   Number of children: Not on file   Years of education: Not on file   Highest education level: Not on file   Occupational History   Not on file  Tobacco Use   Smoking status: Never   Smokeless tobacco: Never  Vaping Use   Vaping status: Never Used  Substance and Sexual Activity   Alcohol use: No    Alcohol/week: 0.0 standard drinks of alcohol   Drug use: No   Sexual activity: Not on file  Other Topics Concern   Not on file  Social History Narrative   Not on file   Social Drivers of Health   Financial Resource Strain: Low Risk  (09/11/2023)   Overall Financial Resource Strain (CARDIA)    Difficulty of Paying Living Expenses: Not hard at all  Food Insecurity: No Food Insecurity (09/11/2023)   Hunger Vital Sign    Worried About Running Out of Food in the Last Year: Never true    Ran Out of Food in the Last Year: Never true  Transportation Needs: No Transportation Needs (09/11/2023)   PRAPARE - Administrator, Civil Service (Medical): No    Lack of Transportation (Non-Medical): No  Physical Activity: Sufficiently Active (09/11/2023)   Exercise Vital Sign    Days of Exercise per Week: 5 days    Minutes of Exercise per Session: 30 min  Stress: No Stress Concern Present (09/11/2023)   [employer] of Occupational Health - Occupational Stress Questionnaire    Feeling of Stress :  Not at all  Social Connections: Moderately Integrated (09/11/2023)   Social Connection and Isolation Panel [NHANES]    Frequency of Communication with Friends and Family: More than three times a week    Frequency of Social Gatherings with Friends and Family: More than three times a week    Attends Religious Services: More than 4 times per year    Active Member of [employer] or Organizations: No    Attends Banker Meetings: Never    Marital Status: Married     Review of Systems  Constitutional:  Negative for appetite change and unexpected weight change.  HENT:  Negative for congestion and sinus pressure.   Respiratory:  Negative for cough, chest tightness and shortness of breath.    Cardiovascular:  Negative for chest pain, palpitations and leg swelling.  Gastrointestinal:  Negative for abdominal pain, diarrhea, nausea and vomiting.  Genitourinary:  Negative for difficulty urinating and dysuria.  Musculoskeletal:  Negative for joint swelling and myalgias.  Skin:  Negative for color change and rash.  Neurological:  Negative for headaches.       Occasional light headedness as outlined.   Psychiatric/Behavioral:  Negative for agitation and dysphoric mood.        Objective:     BP 108/68   Pulse 77   Temp 98 F (36.7 C)   Resp 16   Ht 5\' 5"  (1.651 m)   Wt 147 lb (66.7 kg)   SpO2 99%   BMI 24.46 kg/m  Wt Readings from Last 3 Encounters:  01/23/24 147 lb (66.7 kg)  11/21/23 147 lb 3.2 oz (66.8 kg)  09/11/23 145 lb (65.8 kg)    Physical Exam Vitals reviewed.  Constitutional:      General: He is not in acute distress.    Appearance: Normal appearance. He is well-developed.  HENT:     Head: Normocephalic and atraumatic.     Right Ear: External ear normal.     Left Ear: External ear normal.     Mouth/Throat:     Pharynx: No oropharyngeal exudate or posterior oropharyngeal erythema.  Eyes:     General: No scleral icterus.       Right eye: No discharge.        Left eye: No discharge.     Conjunctiva/sclera: Conjunctivae normal.  Cardiovascular:     Rate and Rhythm: Normal rate and regular rhythm.  Pulmonary:     Effort: Pulmonary effort is normal. No respiratory distress.     Breath sounds: Normal breath sounds.  Abdominal:     General: Bowel sounds are normal.     Palpations: Abdomen is soft.     Tenderness: There is no abdominal tenderness.  Musculoskeletal:        General: No swelling or tenderness.     Cervical back: Neck supple. No tenderness.  Lymphadenopathy:     Cervical: No cervical adenopathy.  Skin:    Findings: No erythema or rash.  Neurological:     Mental Status: He is alert.  Psychiatric:        Mood and Affect: Mood normal.         Behavior: Behavior normal.         Outpatient Encounter Medications as of 01/23/2024  Medication Sig   Ascorbic Acid (VITAMIN C) 500 MG CAPS Take by mouth 2 (two) times daily.   brimonidine (ALPHAGAN) 0.2 % ophthalmic solution Place 1 drop into both eyes daily.   cholecalciferol (VITAMIN D3) 25 MCG (1000 UT)  tablet Take 1,000 Units by mouth daily.   EPINEPHrine  0.3 mg/0.3 mL IJ SOAJ injection Inject 0.3 mg into the muscle as needed for anaphylaxis.   Multiple Vitamin (MULTIVITAMIN) capsule Take 1 capsule by mouth daily.   silodosin  (RAPAFLO ) 4 MG CAPS capsule Take 1 capsule (4 mg total) by mouth daily with breakfast.   [DISCONTINUED] mupirocin  ointment (BACTROBAN ) 2 % Apply 1 Application topically 2 (two) times daily. (Patient not taking: Reported on 09/11/2023)   [DISCONTINUED] silodosin  (RAPAFLO ) 4 MG CAPS capsule Take 1 capsule (4 mg total) by mouth daily with breakfast.   No facility-administered encounter medications on file as of 01/23/2024.     Lab Results  Component Value Date   WBC 4.4 07/05/2023   HGB 13.5 07/05/2023   HCT 41.8 07/05/2023   PLT 210.0 07/05/2023   GLUCOSE 98 10/31/2023   CHOL 166 10/31/2023   TRIG 71.0 10/31/2023   HDL 55.60 10/31/2023   LDLCALC 97 10/31/2023   ALT 6 10/31/2023   AST 20 10/31/2023   NA 138 10/31/2023   K 4.7 10/31/2023   CL 103 10/31/2023   CREATININE 1.03 10/31/2023   BUN 20 10/31/2023   CO2 27 10/31/2023   TSH 1.80 10/31/2023   PSA 6.18 (H) 12/26/2019    DG Shoulder Left Result Date: 11/27/2023 CLINICAL DATA:  Pain and decreased range of motion. Acute left shoulder pain. EXAM: LEFT SHOULDER - 2+ VIEW COMPARISON:  None Available. FINDINGS: There is no evidence of fracture or dislocation. Superior subluxation of the humeral head abutting the undersurface of the acromion. Mild acromioclavicular and glenohumeral degenerative spurring. Subcortical cystic change in the lateral humeral head. The bones are subjectively under  mineralized. No evidence of erosion or focal bone abnormality. Soft tissues are unremarkable. IMPRESSION: 1. Superior subluxation of the humeral head abutting the undersurface of the acromion, suggesting rotator cuff tear. 2. Mild acromioclavicular and glenohumeral osteoarthritis. Electronically Signed   By: Chadwick Colonel M.D.   On: 11/27/2023 12:38       Assessment & Plan:  BPH with elevated PSA Assessment & Plan: Continues on rapaflo .   Orders: -     Urinalysis, Routine w reflex microscopic; Future  Hyponatremia -     Basic metabolic panel with GFR; Future  Aortic valve stenosis, etiology of cardiac valve disease unspecified Assessment & Plan: Dr Junnie Olives - 08/11/22 - Aortic valve stenosis, repeat echo 07/2022 showed severe AS (DVI 0.17, AVA 0.5cm2, mean gradient 34 mmHg-likely underestimated, Vmax 3.7 m/s).  Discussed today. Feels stable. Declines further testing or intervention.   Orders: -     Hepatic function panel; Future -     Lipid panel; Future  Dizziness Assessment & Plan: Not a significant issue for him. Only notices occasionally with position changes - if sitting for a long period of time and then stands. Resolves quickly. Stay hydrated. Desires no further intervention. Follow. Discussed slow position changes and movements.    Other orders -     Silodosin ; Take 1 capsule (4 mg total) by mouth daily with breakfast.  Dispense: 90 capsule; Refill: 3     Dellar Fenton, MD

## 2024-01-23 NOTE — Assessment & Plan Note (Signed)
 Continues on rapaflo .

## 2024-01-23 NOTE — Assessment & Plan Note (Signed)
 Not a significant issue for him. Only notices occasionally with position changes - if sitting for a long period of time and then stands. Resolves quickly. Stay hydrated. Desires no further intervention. Follow. Discussed slow position changes and movements.

## 2024-01-23 NOTE — Assessment & Plan Note (Signed)
 Dr Junnie Olives - 08/11/22 - Aortic valve stenosis, repeat echo 07/2022 showed severe AS (DVI 0.17, AVA 0.5cm2, mean gradient 34 mmHg-likely underestimated, Vmax 3.7 m/s).  Discussed today. Feels stable. Declines further testing or intervention.

## 2024-02-21 ENCOUNTER — Other Ambulatory Visit

## 2024-02-29 ENCOUNTER — Other Ambulatory Visit (INDEPENDENT_AMBULATORY_CARE_PROVIDER_SITE_OTHER)

## 2024-02-29 ENCOUNTER — Ambulatory Visit: Payer: Self-pay | Admitting: Internal Medicine

## 2024-02-29 DIAGNOSIS — R972 Elevated prostate specific antigen [PSA]: Secondary | ICD-10-CM

## 2024-02-29 DIAGNOSIS — E871 Hypo-osmolality and hyponatremia: Secondary | ICD-10-CM | POA: Diagnosis not present

## 2024-02-29 DIAGNOSIS — N400 Enlarged prostate without lower urinary tract symptoms: Secondary | ICD-10-CM

## 2024-02-29 DIAGNOSIS — I350 Nonrheumatic aortic (valve) stenosis: Secondary | ICD-10-CM | POA: Diagnosis not present

## 2024-02-29 LAB — HEPATIC FUNCTION PANEL
ALT: 7 U/L (ref 0–53)
AST: 19 U/L (ref 0–37)
Albumin: 4.2 g/dL (ref 3.5–5.2)
Alkaline Phosphatase: 67 U/L (ref 39–117)
Bilirubin, Direct: 0.1 mg/dL (ref 0.0–0.3)
Total Bilirubin: 0.6 mg/dL (ref 0.2–1.2)
Total Protein: 6.6 g/dL (ref 6.0–8.3)

## 2024-02-29 LAB — BASIC METABOLIC PANEL WITH GFR
BUN: 22 mg/dL (ref 6–23)
CO2: 28 meq/L (ref 19–32)
Calcium: 9.2 mg/dL (ref 8.4–10.5)
Chloride: 102 meq/L (ref 96–112)
Creatinine, Ser: 0.99 mg/dL (ref 0.40–1.50)
GFR: 67.11 mL/min (ref >60.00)
Glucose, Bld: 93 mg/dL (ref 70–99)
Potassium: 4.6 meq/L (ref 3.5–5.1)
Sodium: 137 meq/L (ref 135–145)

## 2024-02-29 LAB — URINALYSIS, ROUTINE W REFLEX MICROSCOPIC
Bilirubin Urine: NEGATIVE
Hgb urine dipstick: NEGATIVE
Ketones, ur: NEGATIVE
Leukocytes,Ua: NEGATIVE
Nitrite: NEGATIVE
Specific Gravity, Urine: 1.025 (ref 1.000–1.030)
Total Protein, Urine: NEGATIVE
Urine Glucose: NEGATIVE
Urobilinogen, UA: 0.2 (ref 0.0–1.0)
pH: 6 (ref 5.0–8.0)

## 2024-02-29 LAB — LIPID PANEL
Cholesterol: 158 mg/dL (ref 0–200)
HDL: 58.3 mg/dL (ref >39.00)
LDL Cholesterol: 87 mg/dL (ref 0–99)
NonHDL: 99.35
Total CHOL/HDL Ratio: 3
Triglycerides: 64 mg/dL (ref 0.0–149.0)
VLDL: 12.8 mg/dL (ref 0.0–40.0)

## 2024-04-23 DIAGNOSIS — H40023 Open angle with borderline findings, high risk, bilateral: Secondary | ICD-10-CM | POA: Diagnosis not present

## 2024-04-23 DIAGNOSIS — Z961 Presence of intraocular lens: Secondary | ICD-10-CM | POA: Diagnosis not present

## 2024-04-23 DIAGNOSIS — H04123 Dry eye syndrome of bilateral lacrimal glands: Secondary | ICD-10-CM | POA: Diagnosis not present

## 2024-07-02 ENCOUNTER — Ambulatory Visit: Payer: Self-pay

## 2024-07-02 NOTE — Telephone Encounter (Signed)
 Lvm okay to relay

## 2024-07-02 NOTE — Telephone Encounter (Signed)
 He has an appt scheduled this week, but if persistent knee pain and swelling - can also walk in at [facility] for evaluation. This is a walk in for ortho problems only. They can see him today and are open until 8pm

## 2024-07-02 NOTE — Telephone Encounter (Signed)
 FYI Only or Action Required?: FYI only for provider.  Patient was last seen in primary care on 01/23/2024 by Glendia Shad, MD.  Called Nurse Triage reporting Joint Swelling.  Symptoms began x 2 months .  Interventions attempted: Nothing.  Symptoms are: gradually worsening.  Triage Disposition: See PCP When Office is Open (Within 3 Days)  Patient/caregiver understands and will follow disposition?: Yes  **Appt. Scheduled for 10/30      Copied from CRM #[id number]. Topic: Clinical - Red Word Triage >> Jul 02, 2024  1:11 PM Zy'onna H wrote: [facility] that prompted transfer to Nurse Triage:  Patients wife is following up on his knee pain that she previously discussed with PCP.  - Pain and Swelling ( Right knee)  - Hot to touch   She is following up to schedule an appointment/get further instruction help for him.   **Transf. To NT** Reason for Disposition  MILD or MODERATE swelling (e.g., can't move joint normally, can't do usual activities) (Exceptions: Itchy, localized swelling; swelling is chronic.)  Answer Assessment - Initial Assessment Questions 1. LOCATION: Where is the swelling located?  (e.g., left, right, both knees)      Right knee   2. ONSET: When did the swelling start? Does it come and go, or is it there all the time?     Ongoing x 2 months    3. SWELLING: How bad is the swelling? Or, How large is it? (e.g., mild, moderate, severe; size of localized swelling)      Moderate    4. PAIN: Is there any pain? If Yes, ask: How bad is it? (Scale 0-10; or none, mild, moderate, severe)     Mild- moderate    5. SETTING: Has there been any recent work, exercise or other activity that involved that part of the body?      Yes, patient does lawn care    6. AGGRAVATING FACTORS: What makes the knee swelling worse? (e.g., walking, climbing stairs, running)      The patient has been doing more lawn care than normal, which is making the swelling-pain  worse.    7. ASSOCIATED SYMPTOMS: Is there any pain or redness?      Pain noted, redness  intermittently   8. OTHER SYMPTOMS: Do you have any other symptoms? (e.g., calf pain, chest pain, difficulty breathing, fever)  No   For OTC medications patient is not taking any medications. Appt. Scheduled for 10/30 for evaluation  Protocols used: Knee Swelling-A-AH

## 2024-07-04 ENCOUNTER — Ambulatory Visit (INDEPENDENT_AMBULATORY_CARE_PROVIDER_SITE_OTHER)

## 2024-07-04 ENCOUNTER — Ambulatory Visit (INDEPENDENT_AMBULATORY_CARE_PROVIDER_SITE_OTHER): Admitting: Nurse Practitioner

## 2024-07-04 ENCOUNTER — Encounter: Payer: Self-pay | Admitting: Nurse Practitioner

## 2024-07-04 VITALS — BP 116/66 | HR 78 | Temp 97.8°F | Ht 65.0 in | Wt 146.0 lb

## 2024-07-04 DIAGNOSIS — M25561 Pain in right knee: Secondary | ICD-10-CM | POA: Insufficient documentation

## 2024-07-04 DIAGNOSIS — M25461 Effusion, right knee: Secondary | ICD-10-CM | POA: Diagnosis not present

## 2024-07-04 DIAGNOSIS — M1711 Unilateral primary osteoarthritis, right knee: Secondary | ICD-10-CM | POA: Diagnosis not present

## 2024-07-04 NOTE — Assessment & Plan Note (Addendum)
 Right knee pain for six months post-fall, worsens with movement, swelling with exertion, warmth noted. - Order x-ray of the left knee today. - Advise alternating warm and cold compresses on the knee. Orders:   DG Knee Complete 4 Views Right; Future

## 2024-07-04 NOTE — Progress Notes (Signed)
 Established Patient Office Visit  Subjective:  Patient ID: Marvin Farley, male    DOB: [birth year]  Age: 89 y.o. MRN: [id number]  CC:  Chief Complaint  Patient presents with   Acute Visit    Right knee swelling and pain x 6 months  Getting Worse- feels like an Aggravating toothache   Discussed the use of AI scribe software for clinical note transcription with the patient, who gave verbal consent to proceed.  History of Present Illness Marvin Farley is a 89 year old male who presents with right knee pain following a fall six months ago.  He experiences right knee pain that began after twisting his knee during a fall on a gym floor about 6 months ago. The pain resembles an 'aggravating toothache' and is more pronounced at night, disrupting sleep. It is located on the top and both sides of the knee, worsened by movement and walking, but does not prevent weight-bearing. The pain intensifies with activities such as walking and climbing stairs. He has not  not tried anything to alleviate the pain. The knee swells with exertion and was warm yesterday. No prior knee pain before the fall.    Past Medical History:  Diagnosis Date   Basal cell carcinoma 12/15/2022   Left ear sup helix - [facility]   BPH (benign prostatic hypertrophy)    Kidney stones    Left wrist fracture 09/05/1982   Osteoporosis    Right wrist fracture 09/05/1994    Past Surgical History:  Procedure Laterality Date   HERNIA REPAIR Bilateral    inguinal   TONSILLECTOMY      Family History  Problem Relation Age of Onset   Cancer Mother        leukemia   Heart disease Father        heart attack   Cancer Brother        Lymphoma    Social History   Socioeconomic History   Marital status: Married    Spouse name: Not on file   Number of children: Not on file   Years of education: Not on file   Highest education level: Not on file  Occupational History   Not on file  Tobacco Use   Smoking status:  Never   Smokeless tobacco: Never  Vaping Use   Vaping status: Never Used  Substance and Sexual Activity   Alcohol use: No    Alcohol/week: 0.0 standard drinks of alcohol   Drug use: No   Sexual activity: Not on file  Other Topics Concern   Not on file  Social History Narrative   Not on file   Social Drivers of Health   Financial Resource Strain: Low Risk  (09/11/2023)   Overall Financial Resource Strain (CARDIA)    Difficulty of Paying Living Expenses: Not hard at all  Food Insecurity: No Food Insecurity (09/11/2023)   Hunger Vital Sign    Worried About Running Out of Food in the Last Year: Never true    Ran Out of Food in the Last Year: Never true  Transportation Needs: No Transportation Needs (09/11/2023)   PRAPARE - Administrator, Civil Service (Medical): No    Lack of Transportation (Non-Medical): No  Physical Activity: Sufficiently Active (09/11/2023)   Exercise Vital Sign    Days of Exercise per Week: 5 days    Minutes of Exercise per Session: 30 min  Stress: No Stress Concern Present (09/11/2023)   [employer] of Occupational Health -  Occupational Stress Questionnaire    Feeling of Stress : Not at all  Social Connections: Moderately Integrated (09/11/2023)   Social Connection and Isolation Panel    Frequency of Communication with Friends and Family: More than three times a week    Frequency of Social Gatherings with Friends and Family: More than three times a week    Attends Religious Services: More than 4 times per year    Active Member of [employer] or Organizations: No    Attends Banker Meetings: Never    Marital Status: Married  Catering Manager Violence: Not At Risk (09/11/2023)   Humiliation, Afraid, Rape, and Kick questionnaire    Fear of Current or Ex-Partner: No    Emotionally Abused: No    Physically Abused: No    Sexually Abused: No     Outpatient Medications Prior to Visit  Medication Sig Dispense Refill   Ascorbic Acid (VITAMIN C)  500 MG CAPS Take by mouth 2 (two) times daily.     brimonidine (ALPHAGAN) 0.2 % ophthalmic solution Place 1 drop into both eyes daily.     cholecalciferol (VITAMIN D3) 25 MCG (1000 UT) tablet Take 1,000 Units by mouth daily.     EPINEPHrine  0.3 mg/0.3 mL IJ SOAJ injection Inject 0.3 mg into the muscle as needed for anaphylaxis. 2 each 0   Multiple Vitamin (MULTIVITAMIN) capsule Take 1 capsule by mouth daily.     silodosin  (RAPAFLO ) 4 MG CAPS capsule Take 1 capsule (4 mg total) by mouth daily with breakfast. 90 capsule 3   No facility-administered medications prior to visit.    Allergies  Allergen Reactions   Bee Venom Other (See Comments)    Patient passes out   Codeine Sulfate     ROS Review of Systems Negative unless indicated in HPI.    Objective:    Physical Exam Cardiovascular:     Rate and Rhythm: Normal rate and regular rhythm.  Pulmonary:     Effort: Pulmonary effort is normal.     Breath sounds: Normal breath sounds. No stridor. No wheezing.  Musculoskeletal:        General: Tenderness (Right knee tenderness allover) present.  Neurological:     General: No focal deficit present.     Mental Status: He is alert and oriented to person, place, and time.  Psychiatric:        Mood and Affect: Mood normal.        Behavior: Behavior normal.     BP 116/66   Pulse 78   Temp 97.8 F (36.6 C)   Ht 5' 5 (1.651 m)   Wt 146 lb (66.2 kg)   SpO2 97%   BMI 24.30 kg/m  Wt Readings from Last 3 Encounters:  07/04/24 146 lb (66.2 kg)  01/23/24 147 lb (66.7 kg)  11/21/23 147 lb 3.2 oz (66.8 kg)     Health Maintenance  Topic Date Due   Zoster Vaccines- Shingrix (1 of 2) Never done   COVID-19 Vaccine (3 - Moderna risk series) 12/25/2019   DTaP/Tdap/Td (2 - Td or Tdap) 07/06/2024 (Originally 11/16/2020)   Influenza Vaccine  12/03/2024 (Originally 04/05/2024)   Medicare Annual Wellness (AWV)  09/10/2024   Pneumococcal Vaccine: 50+ Years  Completed   Meningococcal B  Vaccine  Aged Out    There are no preventive care reminders to display for this patient.  Lab Results  Component Value Date   TSH 1.80 10/31/2023   Lab Results  Component Value Date  WBC 4.4 07/05/2023   HGB 13.5 07/05/2023   HCT 41.8 07/05/2023   MCV 91.3 07/05/2023   PLT 210.0 07/05/2023   Lab Results  Component Value Date   NA 137 02/29/2024   K 4.6 02/29/2024   CO2 28 02/29/2024   GLUCOSE 93 02/29/2024   BUN 22 02/29/2024   CREATININE 0.99 02/29/2024   BILITOT 0.6 02/29/2024   ALKPHOS 67 02/29/2024   AST 19 02/29/2024   ALT 7 02/29/2024   PROT 6.6 02/29/2024   ALBUMIN 4.2 02/29/2024   CALCIUM 9.2 02/29/2024   ANIONGAP 11 01/21/2022   GFR 67.11 02/29/2024   Lab Results  Component Value Date   CHOL 158 02/29/2024   Lab Results  Component Value Date   HDL 58.30 02/29/2024   Lab Results  Component Value Date   LDLCALC 87 02/29/2024   Lab Results  Component Value Date   TRIG 64.0 02/29/2024   Lab Results  Component Value Date   CHOLHDL 3 02/29/2024   No results found for: HGBA1C    Assessment & Plan:   Assessment & Plan Right knee pain, unspecified chronicity Right knee pain for six months post-fall, worsens with movement, swelling with exertion, warmth noted. - Order x-ray of the left knee today. - Advise alternating warm and cold compresses on the knee. Orders:   DG Knee Complete 4 Views Right; Future    Follow-up: Return if symptoms worsen or fail to improve.   Keryn Nessler, NP

## 2024-07-04 NOTE — Patient Instructions (Signed)
 Advised to alternate hot and cold pack. Can take tylenol for pain control.

## 2024-07-12 ENCOUNTER — Ambulatory Visit: Payer: Self-pay | Admitting: Nurse Practitioner

## 2024-07-12 NOTE — Progress Notes (Signed)
 The x ray shows mild arthritis and some inflammation.  No fracture or dislocation.   Can use ice/heat alternatively and tylenol for pain. If pt is agreeable would recommend physical therapy.

## 2024-07-25 ENCOUNTER — Encounter: Payer: Self-pay | Admitting: Internal Medicine

## 2024-07-25 ENCOUNTER — Ambulatory Visit: Admitting: Internal Medicine

## 2024-07-25 VITALS — BP 110/70 | HR 87 | Temp 98.1°F | Ht 65.0 in | Wt 145.5 lb

## 2024-07-25 DIAGNOSIS — M25561 Pain in right knee: Secondary | ICD-10-CM | POA: Diagnosis not present

## 2024-07-25 DIAGNOSIS — I350 Nonrheumatic aortic (valve) stenosis: Secondary | ICD-10-CM

## 2024-07-25 DIAGNOSIS — N400 Enlarged prostate without lower urinary tract symptoms: Secondary | ICD-10-CM

## 2024-07-25 DIAGNOSIS — R1310 Dysphagia, unspecified: Secondary | ICD-10-CM

## 2024-07-25 DIAGNOSIS — R42 Dizziness and giddiness: Secondary | ICD-10-CM

## 2024-07-25 DIAGNOSIS — R972 Elevated prostate specific antigen [PSA]: Secondary | ICD-10-CM

## 2024-07-25 DIAGNOSIS — E871 Hypo-osmolality and hyponatremia: Secondary | ICD-10-CM

## 2024-07-25 DIAGNOSIS — M810 Age-related osteoporosis without current pathological fracture: Secondary | ICD-10-CM | POA: Diagnosis not present

## 2024-07-25 MED ORDER — SILODOSIN 4 MG PO CAPS: 4.0000 mg | ORAL_CAPSULE | Freq: Every day | ORAL | 1 refills | Status: AC

## 2024-07-25 NOTE — Progress Notes (Signed)
 Subjective:    Patient ID: Marvin Farley, male    DOB: 03/09/1932, 89 y.o.   MRN: [id number]  Patient here for  Chief Complaint  Patient presents with   Medical Management of Chronic Issues    6 mth f/u & osteoporosis (care gap)    HPI Here for a scheduled follow up - follow up regarding hypercholesterolemia, hypertension and AS. Saw cardiology 08/11/22 - repeat echo 07/2022 showed severe AS. EF normal 55-60%. Right and left heart cath was recommended, but upon scheduling by staff, patient declined stating not wanting cardiac cath. He had previously informed me that he desires no further intervention or testing regarding his heart valve. Recently evaluated for left shoulder pain. Xray with superior subluxation of the humeral head suggesting rotator cuff tear. Mild arthritis. Recommended referral to ortho. He cancelled his ortho appt. Recently evaluated - right knee pain. Amon 6 months ago - gym). Xray - small joint effusion, mild patellofemoral and medial compartment degenerative changes. Discussed his knee. Still an issue - with steps. Still staying active. Working. He had previously notced some intermittent dizziness/light headedness with changing positions. See last note for details. Notices some increased dizziness when looking up. Discussed further w/up. Discussed f/u regarding his aortic valve. Is agreeable for referral back to cardiology.    Past Medical History:  Diagnosis Date   Basal cell carcinoma 12/15/2022   Left ear sup helix - [facility]   BPH (benign prostatic hypertrophy)    Kidney stones    Left wrist fracture 09/05/1982   Osteoporosis    Right wrist fracture 09/05/1994   Past Surgical History:  Procedure Laterality Date   HERNIA REPAIR Bilateral    inguinal   TONSILLECTOMY     Family History  Problem Relation Age of Onset   Cancer Mother        leukemia   Heart disease Father        heart attack   Cancer Brother        Lymphoma   Social History    Socioeconomic History   Marital status: Married    Spouse name: Not on file   Number of children: Not on file   Years of education: Not on file   Highest education level: Not on file  Occupational History   Not on file  Tobacco Use   Smoking status: Never   Smokeless tobacco: Never  Vaping Use   Vaping status: Never Used  Substance and Sexual Activity   Alcohol use: No    Alcohol/week: 0.0 standard drinks of alcohol   Drug use: No   Sexual activity: Not on file  Other Topics Concern   Not on file  Social History Narrative   Not on file   Social Drivers of Health   Financial Resource Strain: Low Risk  (09/11/2023)   Overall Financial Resource Strain (CARDIA)    Difficulty of Paying Living Expenses: Not hard at all  Food Insecurity: No Food Insecurity (09/11/2023)   Hunger Vital Sign    Worried About Running Out of Food in the Last Year: Never true    Ran Out of Food in the Last Year: Never true  Transportation Needs: No Transportation Needs (09/11/2023)   PRAPARE - Administrator, Civil Service (Medical): No    Lack of Transportation (Non-Medical): No  Physical Activity: Sufficiently Active (09/11/2023)   Exercise Vital Sign    Days of Exercise per Week: 5 days    Minutes of Exercise per Session:  30 min  Stress: No Stress Concern Present (09/11/2023)   [employer] of Occupational Health - Occupational Stress Questionnaire    Feeling of Stress : Not at all  Social Connections: Moderately Integrated (09/11/2023)   Social Connection and Isolation Panel    Frequency of Communication with Friends and Family: More than three times a week    Frequency of Social Gatherings with Friends and Family: More than three times a week    Attends Religious Services: More than 4 times per year    Active Member of [employer] or Organizations: No    Attends Banker Meetings: Never    Marital Status: Married     Review of Systems  Constitutional:  Negative for  appetite change and unexpected weight change.  HENT:  Negative for congestion and sinus pressure.   Respiratory:  Negative for cough and chest tightness.        Breathing stable.   Cardiovascular:  Negative for chest pain and palpitations.       No increased swelling.   Gastrointestinal:  Negative for abdominal pain, diarrhea, nausea and vomiting.  Genitourinary:  Negative for difficulty urinating and dysuria.  Musculoskeletal:  Negative for myalgias.       Knee pain as outlined.   Skin:  Negative for color change and rash.  Neurological:  Positive for dizziness. Negative for headaches.  Psychiatric/Behavioral:  Negative for agitation and dysphoric mood.        Objective:     BP 110/70   Pulse 87   Temp 98.1 F (36.7 C) (Oral)   Ht 5' 5 (1.651 m)   Wt 145 lb 8 oz (66 kg)   SpO2 96%   BMI 24.21 kg/m  Wt Readings from Last 3 Encounters:  07/25/24 145 lb 8 oz (66 kg)  07/04/24 146 lb (66.2 kg)  01/23/24 147 lb (66.7 kg)    Physical Exam Vitals reviewed.  Constitutional:      General: He is not in acute distress.    Appearance: Normal appearance. He is well-developed.  HENT:     Head: Normocephalic and atraumatic.     Right Ear: External ear normal.     Left Ear: External ear normal.     Mouth/Throat:     Pharynx: No oropharyngeal exudate or posterior oropharyngeal erythema.  Eyes:     General: No scleral icterus.       Right eye: No discharge.        Left eye: No discharge.     Conjunctiva/sclera: Conjunctivae normal.  Cardiovascular:     Rate and Rhythm: Normal rate and regular rhythm.     Comments: 2-3/6 systolic murmur.  Pulmonary:     Effort: Pulmonary effort is normal. No respiratory distress.     Breath sounds: Normal breath sounds.  Abdominal:     General: Bowel sounds are normal.     Palpations: Abdomen is soft.     Tenderness: There is no abdominal tenderness.  Musculoskeletal:        General: No swelling or tenderness.     Cervical back: Neck  supple. No tenderness.  Lymphadenopathy:     Cervical: No cervical adenopathy.  Skin:    Findings: No erythema or rash.  Neurological:     Mental Status: He is alert.  Psychiatric:        Mood and Affect: Mood normal.        Behavior: Behavior normal.         Outpatient Encounter Medications  as of 07/25/2024  Medication Sig   Ascorbic Acid (VITAMIN C) 500 MG CAPS Take by mouth 2 (two) times daily.   brimonidine (ALPHAGAN) 0.2 % ophthalmic solution Place 1 drop into both eyes daily.   cholecalciferol (VITAMIN D3) 25 MCG (1000 UT) tablet Take 1,000 Units by mouth daily.   EPINEPHrine  0.3 mg/0.3 mL IJ SOAJ injection Inject 0.3 mg into the muscle as needed for anaphylaxis.   Multiple Vitamin (MULTIVITAMIN) capsule Take 1 capsule by mouth daily.   silodosin  (RAPAFLO ) 4 MG CAPS capsule Take 1 capsule (4 mg total) by mouth daily with breakfast.   [DISCONTINUED] silodosin  (RAPAFLO ) 4 MG CAPS capsule Take 1 capsule (4 mg total) by mouth daily with breakfast.   No facility-administered encounter medications on file as of 07/25/2024.     Lab Results  Component Value Date   WBC 5.8 07/25/2024   HGB 13.9 07/25/2024   HCT 42.0 07/25/2024   PLT 225.0 07/25/2024   GLUCOSE 87 07/25/2024   CHOL 162 07/25/2024   TRIG 97.0 07/25/2024   HDL 60.80 07/25/2024   LDLCALC 81 07/25/2024   ALT 7 07/25/2024   AST 21 07/25/2024   NA 139 07/25/2024   K 4.4 07/25/2024   CL 103 07/25/2024   CREATININE 1.14 07/25/2024   BUN 30 (H) 07/25/2024   CO2 25 07/25/2024   TSH 1.80 10/31/2023   PSA 6.86 (H) 07/25/2024    DG Shoulder Left Result Date: 11/27/2023 CLINICAL DATA:  Pain and decreased range of motion. Acute left shoulder pain. EXAM: LEFT SHOULDER - 2+ VIEW COMPARISON:  None Available. FINDINGS: There is no evidence of fracture or dislocation. Superior subluxation of the humeral head abutting the undersurface of the acromion. Mild acromioclavicular and glenohumeral degenerative spurring.  Subcortical cystic change in the lateral humeral head. The bones are subjectively under mineralized. No evidence of erosion or focal bone abnormality. Soft tissues are unremarkable. IMPRESSION: 1. Superior subluxation of the humeral head abutting the undersurface of the acromion, suggesting rotator cuff tear. 2. Mild acromioclavicular and glenohumeral osteoarthritis. Electronically Signed   By: Andrea Gasman M.D.   On: 11/27/2023 12:38       Assessment & Plan:  Osteoporosis without current pathological fracture, unspecified osteoporosis type Assessment & Plan: Has declined prescription treatment. Check vitamin d  level.   Orders: -     VITAMIN D  25 Hydroxy (Vit-D Deficiency, Fractures)  Aortic valve stenosis, etiology of cardiac valve disease unspecified Assessment & Plan: Dr Budd Kindle - 08/11/22 - Aortic valve stenosis, repeat echo 07/2022 showed severe AS (DVI 0.17, AVA 0.5cm2, mean gradient 34 mmHg-likely underestimated, Vmax 3.7 m/s).  Right and left heart cath was recommended, but upon scheduling by staff, patient declined stating not wanting cardiac cath. Discussed again today. Agreeable for further evaluation and w/up. Refer back to cardiology.   Orders: -     Basic metabolic panel with GFR -     Hepatic function panel -     Lipid panel -     CBC with Differential/Platelet -     Ambulatory referral to Cardiology  Benign prostatic hyperplasia without lower urinary tract symptoms -     PSA, Medicare  Dizziness Assessment & Plan: Discussed persistent intermittent dizziness as outlined. Discussed further w/up and evaluation. He is agreeable for cardiology referral to f/u regarding aortic stenosis. Desires no further w/up. Blood pressure as outlined. Not orthostatic on exam. Denies room spinning. Discussed carotid evaluation. Refer back to cardiology - f/u regarding severe AS. Follow.  BPH with elevated PSA Assessment & Plan:  Continues on rapaflo . Stable.    Right knee  pain, unspecified chronicity Assessment & Plan:  Recently evaluated for left shoulder pain. Xray with superior subluxation of the humeral head suggesting rotator cuff tear. Mild arthritis. Recommended referral to ortho. He cancelled his ortho appt. Recently evaluated - right knee pain. Amon 6 months ago - gym). Xray - small joint effusion, mild patellofemoral and medial compartment degenerative changes. Discussed his knee. Still an issue - with steps. Still staying active. Working. Notify if desires further intervention.    Other orders -     Silodosin ; Take 1 capsule (4 mg total) by mouth daily with breakfast.  Dispense: 90 capsule; Refill: 1     Allena Hamilton, MD

## 2024-07-26 LAB — CBC WITH DIFFERENTIAL/PLATELET
Basophils Absolute: 0.1 K/uL (ref 0.0–0.1)
Basophils Relative: 2 % (ref 0.0–3.0)
Eosinophils Absolute: 0.7 K/uL (ref 0.0–0.7)
Eosinophils Relative: 12.2 % — ABNORMAL HIGH (ref 0.0–5.0)
HCT: 42 % (ref 39.0–52.0)
Hemoglobin: 13.9 g/dL (ref 13.0–17.0)
Lymphocytes Relative: 31.3 % (ref 12.0–46.0)
Lymphs Abs: 1.8 K/uL (ref 0.7–4.0)
MCHC: 33.2 g/dL (ref 30.0–36.0)
MCV: 90.6 fl (ref 78.0–100.0)
Monocytes Absolute: 0.5 K/uL (ref 0.1–1.0)
Monocytes Relative: 8.2 % (ref 3.0–12.0)
Neutro Abs: 2.7 K/uL (ref 1.4–7.7)
Neutrophils Relative %: 46.3 % (ref 43.0–77.0)
Platelets: 225 K/uL (ref 150.0–400.0)
RBC: 4.63 Mil/uL (ref 4.22–5.81)
RDW: 14.3 % (ref 11.5–15.5)
WBC: 5.8 K/uL (ref 4.0–10.5)

## 2024-07-26 LAB — LIPID PANEL
Cholesterol: 162 mg/dL (ref 0–200)
HDL: 60.8 mg/dL (ref >39.00)
LDL Cholesterol: 81 mg/dL (ref 0–99)
NonHDL: 100.7
Total CHOL/HDL Ratio: 3
Triglycerides: 97 mg/dL (ref 0.0–149.0)
VLDL: 19.4 mg/dL (ref 0.0–40.0)

## 2024-07-26 LAB — HEPATIC FUNCTION PANEL
ALT: 7 U/L (ref 0–53)
AST: 21 U/L (ref 0–37)
Albumin: 4.3 g/dL (ref 3.5–5.2)
Alkaline Phosphatase: 77 U/L (ref 39–117)
Bilirubin, Direct: 0.1 mg/dL (ref 0.0–0.3)
Total Bilirubin: 0.3 mg/dL (ref 0.2–1.2)
Total Protein: 6.8 g/dL (ref 6.0–8.3)

## 2024-07-26 LAB — BASIC METABOLIC PANEL WITH GFR
BUN: 30 mg/dL — ABNORMAL HIGH (ref 6–23)
CO2: 25 meq/L (ref 19–32)
Calcium: 9 mg/dL (ref 8.4–10.5)
Chloride: 103 meq/L (ref 96–112)
Creatinine, Ser: 1.14 mg/dL (ref 0.40–1.50)
GFR: 56.5 mL/min — ABNORMAL LOW (ref >60.00)
Glucose, Bld: 87 mg/dL (ref 70–99)
Potassium: 4.4 meq/L (ref 3.5–5.1)
Sodium: 139 meq/L (ref 135–145)

## 2024-07-26 LAB — PSA, MEDICARE: PSA: 6.86 ng/mL — ABNORMAL HIGH (ref 0.10–4.00)

## 2024-07-26 LAB — VITAMIN D 25 HYDROXY (VIT D DEFICIENCY, FRACTURES): VITD: 21.89 ng/mL — ABNORMAL LOW (ref 30.00–100.00)

## 2024-07-29 ENCOUNTER — Ambulatory Visit: Payer: Self-pay | Admitting: Internal Medicine

## 2024-07-29 ENCOUNTER — Other Ambulatory Visit: Payer: Self-pay | Admitting: *Deleted

## 2024-07-29 DIAGNOSIS — I350 Nonrheumatic aortic (valve) stenosis: Secondary | ICD-10-CM

## 2024-07-29 DIAGNOSIS — E871 Hypo-osmolality and hyponatremia: Secondary | ICD-10-CM

## 2024-08-03 ENCOUNTER — Encounter: Payer: Self-pay | Admitting: Internal Medicine

## 2024-08-03 NOTE — Assessment & Plan Note (Signed)
 Discussed persistent intermittent dizziness as outlined. Discussed further w/up and evaluation. He is agreeable for cardiology referral to f/u regarding aortic stenosis. Desires no further w/up. Blood pressure as outlined. Not orthostatic on exam. Denies room spinning. Discussed carotid evaluation. Refer back to cardiology - f/u regarding severe AS. Follow.

## 2024-08-03 NOTE — Assessment & Plan Note (Signed)
 Continues on rapaflo . Stable.

## 2024-08-03 NOTE — Assessment & Plan Note (Signed)
 Recently evaluated for left shoulder pain. Xray with superior subluxation of the humeral head suggesting rotator cuff tear. Mild arthritis. Recommended referral to ortho. He cancelled his ortho appt. Recently evaluated - right knee pain. Amon 6 months ago - gym). Xray - small joint effusion, mild patellofemoral and medial compartment degenerative changes. Discussed his knee. Still an issue - with steps. Still staying active. Working. Notify if desires further intervention.

## 2024-08-03 NOTE — Assessment & Plan Note (Signed)
 Has declined prescription treatment. Check vitamin d  level.

## 2024-08-03 NOTE — Assessment & Plan Note (Signed)
 Dr Budd Kindle - 08/11/22 - Aortic valve stenosis, repeat echo 07/2022 showed severe AS (DVI 0.17, AVA 0.5cm2, mean gradient 34 mmHg-likely underestimated, Vmax 3.7 m/s).  Right and left heart cath was recommended, but upon scheduling by staff, patient declined stating not wanting cardiac cath. Discussed again today. Agreeable for further evaluation and w/up. Refer back to cardiology.

## 2024-08-12 ENCOUNTER — Other Ambulatory Visit

## 2024-08-14 ENCOUNTER — Other Ambulatory Visit

## 2024-08-14 DIAGNOSIS — E871 Hypo-osmolality and hyponatremia: Secondary | ICD-10-CM | POA: Diagnosis not present

## 2024-08-14 DIAGNOSIS — I350 Nonrheumatic aortic (valve) stenosis: Secondary | ICD-10-CM | POA: Diagnosis not present

## 2024-08-14 LAB — CBC WITH DIFFERENTIAL/PLATELET
Basophils Absolute: 0.1 K/uL (ref 0.0–0.1)
Basophils Relative: 1.1 % (ref 0.0–3.0)
Eosinophils Absolute: 0.5 K/uL (ref 0.0–0.7)
Eosinophils Relative: 11.5 % — ABNORMAL HIGH (ref 0.0–5.0)
HCT: 41.4 % (ref 39.0–52.0)
Hemoglobin: 14.1 g/dL (ref 13.0–17.0)
Lymphocytes Relative: 35.1 % (ref 12.0–46.0)
Lymphs Abs: 1.6 K/uL (ref 0.7–4.0)
MCHC: 34.1 g/dL (ref 30.0–36.0)
MCV: 89.3 fl (ref 78.0–100.0)
Monocytes Absolute: 0.4 K/uL (ref 0.1–1.0)
Monocytes Relative: 9.5 % (ref 3.0–12.0)
Neutro Abs: 2 K/uL (ref 1.4–7.7)
Neutrophils Relative %: 42.8 % — ABNORMAL LOW (ref 43.0–77.0)
Platelets: 205 K/uL (ref 150.0–400.0)
RBC: 4.64 Mil/uL (ref 4.22–5.81)
RDW: 14.6 % (ref 11.5–15.5)
WBC: 4.6 K/uL (ref 4.0–10.5)

## 2024-08-14 LAB — BASIC METABOLIC PANEL WITH GFR
BUN: 23 mg/dL (ref 6–23)
CO2: 27 meq/L (ref 19–32)
Calcium: 9.2 mg/dL (ref 8.4–10.5)
Chloride: 104 meq/L (ref 96–112)
Creatinine, Ser: 0.96 mg/dL (ref 0.40–1.50)
GFR: 69.42 mL/min (ref >60.00)
Glucose, Bld: 87 mg/dL (ref 70–99)
Potassium: 4.8 meq/L (ref 3.5–5.1)
Sodium: 138 meq/L (ref 135–145)

## 2024-08-15 ENCOUNTER — Other Ambulatory Visit: Payer: Self-pay | Admitting: Internal Medicine

## 2024-08-15 ENCOUNTER — Ambulatory Visit: Payer: Self-pay | Admitting: Internal Medicine

## 2024-08-15 DIAGNOSIS — D721 Eosinophilia, unspecified: Secondary | ICD-10-CM

## 2024-08-15 NOTE — Progress Notes (Signed)
Order placed for f/u cbc.   

## 2024-08-26 ENCOUNTER — Encounter: Payer: Self-pay | Admitting: Internal Medicine

## 2024-08-26 ENCOUNTER — Telehealth: Payer: Self-pay

## 2024-08-26 NOTE — Telephone Encounter (Unsigned)
 Copied from CRM #[id number]. Topic: Clinical - Lab/Test Results >> Aug 26, 2024 10:21 AM Marvin Farley wrote: Reason for CRM: Patient returning call regarding lab results. Relayed note that PCP attached with results and got patient scheduled for a follow-up CBC.

## 2024-08-26 NOTE — Telephone Encounter (Signed)
 Patient does not have MyChart, so I mailed him a letter regarding the new date/time for his appointment with Dr. Allena Hamilton.

## 2024-08-26 NOTE — Telephone Encounter (Signed)
 Copied from CRM #[id number]. Topic: Clinical - Medical Advice >> Aug 26, 2024 10:23 AM Marvin Farley wrote: Reason for CRM: Patient has had a sore throat/throat irritation since this past Saturday. Stated it is not painful and denied coughing up any mucous. Would like to know if there is anything he can use to help get rid of it.

## 2024-08-26 NOTE — Telephone Encounter (Signed)
 I left a voicemail for patient letting him know that Dr. Allena Hamilton has had a change in her schedule, so we need to reschedule his appointment with her from 10/25/2024 at 2pm to 2/18/2026at 2pm.  I asked patient to please let us  know if this new date/time does not work with his schedule.  E2C2 - if patient calls back, please assist him with rescheduling his appointment.

## 2024-08-27 ENCOUNTER — Telehealth: Payer: Self-pay

## 2024-08-27 NOTE — Telephone Encounter (Signed)
 Lvm

## 2024-08-27 NOTE — Telephone Encounter (Signed)
 Copied from CRM #[id number]. Topic: Clinical - Medical Advice >> Aug 27, 2024 10:56 AM Viola F wrote: Reason for CRM: Patient returned Daijah's phone call, please reach back out

## 2024-08-28 NOTE — Telephone Encounter (Signed)
 Lvm to see how pt was doing and discuss labs. Okay to relay

## 2024-09-13 ENCOUNTER — Ambulatory Visit

## 2024-09-13 VITALS — BP 114/68 | HR 67 | Ht 65.0 in | Wt 144.0 lb

## 2024-09-13 DIAGNOSIS — R55 Syncope and collapse: Secondary | ICD-10-CM

## 2024-09-13 DIAGNOSIS — I350 Nonrheumatic aortic (valve) stenosis: Secondary | ICD-10-CM

## 2024-09-13 DIAGNOSIS — Z79899 Other long term (current) drug therapy: Secondary | ICD-10-CM

## 2024-09-13 NOTE — H&P (View-Only) (Signed)
 " Cardiology Office Note   Date:  09/13/2024  ID:  Marvin Farley, DOB 09-Mar-1932, MRN [id number] PCP: Glendia Shad, MD  [facility] Providers Cardiologist:  Caron Poser, MD     History of Present Illness Marvin Farley is a 89 y.o. male PMH pLFLG severe aortic stenosis who presents for further evaluation and management of aortic stenosis.  Previously seen by Dr. Darliss 08/2022.  There had been plans in place for TAVR evaluation and pre-TAVR cardiac catheterization.  However, patient was hesitant to pursue this and ended up canceling appointments.  He now reports that his symptoms are worsened and he is having more frequent presyncope and dizziness.  Last LDL 81 07/2024.  Relevant CVD History -TTE 07/2022 LVEF 55 to 60%, grade 1 diastolic dysfunction, normal RV size and function, mild to moderate MR, mild AI with severe pLFLG AS (AVA 0.50, MG 34, Vmax 3.7, DVI 0.18 with SVI 26)   ROS: Pt denies any chest discomfort, jaw pain, arm pain, palpitations, syncope, presyncope, orthopnea, PND, or LE edema.  Studies Reviewed I have independently reviewed the patient's ECG, previous cardiac testing, previous medical records, previous blood work.  Physical Exam VS:  BP 114/68 (BP Location: Left Arm, Patient Position: Sitting, Cuff Size: Normal)   Pulse 67   Ht 5' 5 (1.651 m)   Wt 144 lb (65.3 kg)   SpO2 96%   BMI 23.96 kg/m   Orthostatic VS for the past 24 hrs (Last 3 readings):  BP- Lying Pulse- Lying BP- Sitting Pulse- Sitting BP- Standing at 0 minutes Pulse- Standing at 0 minutes BP- Standing at 3 minutes Pulse- Standing at 3 minutes  09/13/24 1450 110/62 70 117/67 73 127/73 76 129/74 77      Wt Readings from Last 3 Encounters:  09/13/24 144 lb (65.3 kg)  07/25/24 145 lb 8 oz (66 kg)  07/04/24 146 lb (66.2 kg)    GEN: No acute distress. NECK: No JVD; No carotid bruits. CARDIAC: RRR, 2/6 AS murmur, rubs, gallops. RESPIRATORY:  Clear to  auscultation. EXTREMITIES:  Warm and well-perfused. No edema.  ASSESSMENT AND PLAN Severe low-flow low gradient aortic stenosis Patient presents for further evaluation of paradoxical low-flow low gradient AS.  His echo in 2023 showed LVEF 55-60% with AVA 0.50, MG 34, Vmax 3.7, DVI 0.18 with SVI 26.  He has had progressive symptoms since then including more frequent presyncope/dizziness with exertion, presumably due to worsening AS.  He reports he is now ready to consider TAVR evaluation.  Although he is 89 years old, he otherwise seems to be a reasonable candidate and does not have much else in the way of other chronic medical issues.  Plan: - Pre-TAVR coronary angiogram and right heart cath - Structural heart team referral.  Unlikely to be a surgical candidate given age. - Update echocardiogram  Near syncope Seems most likely due to progressive AS.  However, we will make sure that he does not have concomitant atrial fibrillation or other arrhythmia.  Plan: - Zio monitor - Echocardiogram and TAVR evaluation as above     Informed Consent   The risks [stroke (1 in 1000), death (1 in 1000), kidney failure [usually temporary] (1 in 500), bleeding (1 in 200), allergic reaction [possibly serious] (1 in 200)], benefits (diagnostic support and management of coronary artery disease) and alternatives of a cardiac catheterization were discussed in detail with [name] and he is willing to proceed.     Dispo: RTC 6 months or sooner  as needed; plan TAVR evaluation  Signed, Caron Poser, MD  "

## 2024-09-13 NOTE — Progress Notes (Signed)
 " Cardiology Office Note   Date:  09/13/2024  ID:  SUVAN STCYR, DOB 09-Mar-1932, MRN [id number] PCP: Glendia Shad, MD  [facility] Providers Cardiologist:  Caron Poser, MD     History of Present Illness Marvin Farley is a 89 y.o. male PMH pLFLG severe aortic stenosis who presents for further evaluation and management of aortic stenosis.  Previously seen by Dr. Darliss 08/2022.  There had been plans in place for TAVR evaluation and pre-TAVR cardiac catheterization.  However, patient was hesitant to pursue this and ended up canceling appointments.  He now reports that his symptoms are worsened and he is having more frequent presyncope and dizziness.  Last LDL 81 07/2024.  Relevant CVD History -TTE 07/2022 LVEF 55 to 60%, grade 1 diastolic dysfunction, normal RV size and function, mild to moderate MR, mild AI with severe pLFLG AS (AVA 0.50, MG 34, Vmax 3.7, DVI 0.18 with SVI 26)   ROS: Pt denies any chest discomfort, jaw pain, arm pain, palpitations, syncope, presyncope, orthopnea, PND, or LE edema.  Studies Reviewed I have independently reviewed the patient's ECG, previous cardiac testing, previous medical records, previous blood work.  Physical Exam VS:  BP 114/68 (BP Location: Left Arm, Patient Position: Sitting, Cuff Size: Normal)   Pulse 67   Ht 5' 5 (1.651 m)   Wt 144 lb (65.3 kg)   SpO2 96%   BMI 23.96 kg/m   Orthostatic VS for the past 24 hrs (Last 3 readings):  BP- Lying Pulse- Lying BP- Sitting Pulse- Sitting BP- Standing at 0 minutes Pulse- Standing at 0 minutes BP- Standing at 3 minutes Pulse- Standing at 3 minutes  09/13/24 1450 110/62 70 117/67 73 127/73 76 129/74 77      Wt Readings from Last 3 Encounters:  09/13/24 144 lb (65.3 kg)  07/25/24 145 lb 8 oz (66 kg)  07/04/24 146 lb (66.2 kg)    GEN: No acute distress. NECK: No JVD; No carotid bruits. CARDIAC: RRR, 2/6 AS murmur, rubs, gallops. RESPIRATORY:  Clear to  auscultation. EXTREMITIES:  Warm and well-perfused. No edema.  ASSESSMENT AND PLAN Severe low-flow low gradient aortic stenosis Patient presents for further evaluation of paradoxical low-flow low gradient AS.  His echo in 2023 showed LVEF 55-60% with AVA 0.50, MG 34, Vmax 3.7, DVI 0.18 with SVI 26.  He has had progressive symptoms since then including more frequent presyncope/dizziness with exertion, presumably due to worsening AS.  He reports he is now ready to consider TAVR evaluation.  Although he is 89 years old, he otherwise seems to be a reasonable candidate and does not have much else in the way of other chronic medical issues.  Plan: - Pre-TAVR coronary angiogram and right heart cath - Structural heart team referral.  Unlikely to be a surgical candidate given age. - Update echocardiogram  Near syncope Seems most likely due to progressive AS.  However, we will make sure that he does not have concomitant atrial fibrillation or other arrhythmia.  Plan: - Zio monitor - Echocardiogram and TAVR evaluation as above     Informed Consent   The risks [stroke (1 in 1000), death (1 in 1000), kidney failure [usually temporary] (1 in 500), bleeding (1 in 200), allergic reaction [possibly serious] (1 in 200)], benefits (diagnostic support and management of coronary artery disease) and alternatives of a cardiac catheterization were discussed in detail with Marvin Farley and he is willing to proceed.     Dispo: RTC 6 months or sooner  as needed; plan TAVR evaluation  Signed, Caron Poser, MD  "

## 2024-09-13 NOTE — Patient Instructions (Incomplete)
 Medication Instructions:  *** *If you need a refill on your cardiac medications before your next appointment, please call your pharmacy*  Lab Work: *** If you have labs (blood work) drawn today and your tests are completely normal, you will receive your results only by: MyChart Message (if you have MyChart) OR A paper copy in the mail If you have any lab test that is abnormal or we need to change your treatment, we will call you to review the results.  Testing/Procedures: ***  Follow-Up: At [facility], you and your health needs are our priority.  As part of our continuing mission to provide you with exceptional heart care, our providers are all part of one team.  This team includes your primary Cardiologist (physician) and Advanced Practice Providers or APPs (Physician Assistants and Nurse Practitioners) who all work together to provide you with the care you need, when you need it.  Your next appointment:   {numbers 1-12:[id number]} {Time; day/wk/mo/yr(s):9076}  Provider:   {Providers/Teams        :[id number]} {If no MD populates, click here to update Cardiologist or EP   DO NOT delete brackets or number around this link :1}   We recommend signing up for the patient portal called MyChart.  Sign up information is provided on this After Visit Summary.  MyChart is used to connect with patients for Virtual Visits (Telemedicine).  Patients are able to view lab/test results, encounter notes, upcoming appointments, etc.  Non-urgent messages can be sent to your provider as well.   To learn more about what you can do with MyChart, go to [email or web address].   Other Instructions ***

## 2024-09-13 NOTE — Patient Instructions (Signed)
 Medication Instructions:  Your physician recommends that you continue on your current medications as directed. Please refer to the Current Medication list given to you today.  *If you need a refill on your cardiac medications before your next appointment, please call your pharmacy*  Lab Work: Your provider would like for you to have following labs drawn today CBC, BMET.   If you have labs (blood work) drawn today and your tests are completely normal, you will receive your results only by: MyChart Message (if you have MyChart) OR A paper copy in the mail If you have any lab test that is abnormal or we need to change your treatment, we will call you to review the results.  Testing/Procedurees:  Echocardiogram:  Your physician has requested that you have an echocardiogram. Echocardiography is a painless test that uses sound waves to create images of your heart. It provides your doctor with information about the size and shape of your heart and how well your hearts chambers and valves are working.   You may receive an ultrasound enhancing agent through an IV if needed to better visualize your heart during the echo. This procedure takes approximately one hour.  There are no restrictions for this procedure.  This will take place at [address] [facility] [address], [place] [address]  Please note: We ask at that you not bring children with you during ultrasound (echo/ vascular) testing. Due to room size and safety concerns, children are not allowed in the ultrasound rooms during exams. Our front office staff cannot provide observation of children in our lobby area while testing is being conducted. An adult accompanying a patient to their appointment will only be allowed in the ultrasound room at the discretion of the ultrasound technician under special circumstances. We apologize for any inconvenience.   ZIO XT- Long Term Monitor Instructions  Your physician has requested you wear  a ZIO patch monitor for 14 days.  This is a single patch monitor. [employer] supplies one patch monitor per enrollment. Additional stickers are not available. Please do not apply patch if you will be having a Nuclear Stress Test, Echocardiogram, Cardiac CT, MRI, or Chest Xray during the period you would be wearing the monitor. The patch cannot be worn during these tests. You cannot remove and re-apply the ZIO XT patch monitor.  Your ZIO patch monitor will be mailed 3 day USPS to your address on file. It may take 3-5 days to receive your monitor after you have been enrolled. Once you have received your monitor, please review the enclosed instructions. Your monitor has already been registered assigning a specific monitor serial number to you.  Billing and Patient Assistance Program Information  We have supplied [employer] with any of your insurance information on file for billing purposes.  [employer] offers a sliding scale Patient Assistance Program for patients that do not have insurance, or whose insurance does not completely cover the cost of the ZIO monitor.  You must apply for the Patient Assistance Program to qualify for this discounted rate.  To apply, please call [employer] at [phone], select option 4, select option 2, ask to apply for Patient Assistance Program. Meredeth will ask your household income, and how many people are in your household. They will quote your out-of-pocket cost based on that information. [employer] will also be able to set up a 36-month, interest-free payment plan if needed.  Applying the monitor   Shave hair from upper left chest.  Hold abrader disc by orange  tab. Rub abrader in 40 strokes over the upper left chest as indicated in your monitor instructions.  Clean area with 4 enclosed alcohol pads. Let dry.  Apply patch as indicated in monitor instructions. Patch will be placed under collarbone on left side of chest with arrow pointing upward.  Rub patch adhesive wings for 2  minutes. Remove white label marked 1. Remove the white label marked 2. Rub patch adhesive wings for 2 additional minutes.  While looking in a mirror, press and release button in center of patch. A small green light will flash 3-4 times. This will be your only indicator that the monitor has been turned on.   After Applying Monitor: Do not shower for the first 24 hours. You may shower after the first 24 hours.  Press the button if you feel a symptom. You will hear a small click. Record Date, Time and Symptom in the Patient Logbook.   After Completing 14 Days: When you are ready to remove the patch, follow instructions on the last 2 pages of Patient Logbook.  Stick patch monitor into the tabs at the bottom of the return box.  Place Patient Logbook in the blue and white box. Use locking tab on box and tape box closed securely. The blue and white box has prepaid postage on it. Please place it in the mailbox as soon as possible. Your physician should have your test results approximately 7-14 days after the monitor has been mailed back to [facility].   Troubleshooting: Call [facility] at [phone] if you have questions regarding your ZIO XT patch monitor.  Call them immediately if you see an orange light blinking on your monitor.  If your monitor falls off in less than 4 days, contact our Monitor department at [phone].  If your monitor becomes loose or falls off after 4 days call [employer] at [phone] for suggestions on securing your monitor.     Follow-Up: At [facility], you and your health needs are our priority.  As part of our continuing mission to provide you with exceptional heart care, our providers are all part of one team.  This team includes your primary Cardiologist (physician) and Advanced Practice Providers or APPs (Physician Assistants and Nurse Practitioners) who all work together to provide you with the care you need, when you need  it.  Your next appointment:    Referral to [facility] for TAVR evaluation   6 month(s)  Provider:   Caron Poser, MD    We recommend signing up for the patient portal called MyChart.  Sign up information is provided on this After Visit Summary.  MyChart is used to connect with patients for Virtual Visits (Telemedicine).  Patients are able to view lab/test results, encounter notes, upcoming appointments, etc.  Non-urgent messages can be sent to your provider as well.   To learn more about what you can do with MyChart, go to [email or web address].   Other Instructions  [facility] A DEPT OF [place] [place] [facility] [address] [place], [address] Marvin [place] [address] Dept: [phone] Loc: [phone]  Marvin Farley  09/13/2024  You are scheduled for a Cardiac Catheterization on Tuesday, January 27 with Dr. Lonni End.  1. Please arrive at the [facility] Vascular Center Entrance of [facility], [address], [place] [address] at 6:30 AM (This is 1 hour(s) prior to your procedure time).  Proceed to the Check-In Desk directly inside the  entrance.  Procedure Parking: Use the entrance off of the [address] side of the hospital. Turn right upon entering and follow the driveway to parking that is directly in front of the Heart & Vascular Center. There is no valet parking available at this entrance, however there is an awning directly in front of the Heart & Vascular Center for drop off/ pick up for patients.  Special note: Every effort is made to have your procedure done on time. Please understand that emergencies sometimes delay scheduled procedures.  2. Diet: Nothing to eat or drink after midnight.   3. Hydration: You need to be well hydrated before your procedure. On January 27, you may drink approved liquids (see below) until 5:30 a.m. hours before the procedure, with 16 oz of water as your last  intake.   List of approved liquids water, clear juice, clear tea, black coffee, fruit juices, non-citric and without pulp, carbonated beverages, Gatorade, Kool -Aid, plain Jello-O and plain ice popsicles.  4. Labs: Labs drawn on 09/13/2024 CBC and BMET,     5. Medication instructions in preparation for your procedure:   Contrast Allergy: No   On the morning of your procedure,  any morning medicines NOT listed above.  You may use sips of water.  6. Plan to go home the same day, you will only stay overnight if medically necessary. 7. Bring a current list of your medications and current insurance cards. 8. You MUST have a responsible person to drive you home. 9. Someone MUST be with you the first 24 hours after you arrive home or your discharge will be delayed. 10. Please wear clothes that are easy to get on and off and wear slip-on shoes.  Thank you for allowing us  to care for you!   -- [facility]

## 2024-09-14 ENCOUNTER — Ambulatory Visit: Payer: Self-pay

## 2024-09-14 LAB — BASIC METABOLIC PANEL WITH GFR
BUN/Creatinine Ratio: 21 (ref 10–24)
BUN: 22 mg/dL (ref 10–36)
CO2: 24 mmol/L (ref 20–29)
Calcium: 9.2 mg/dL (ref 8.6–10.2)
Chloride: 100 mmol/L (ref 96–106)
Creatinine, Ser: 1.04 mg/dL (ref 0.76–1.27)
Glucose: 87 mg/dL (ref 70–99)
Potassium: 5.1 mmol/L (ref 3.5–5.2)
Sodium: 138 mmol/L (ref 134–144)
eGFR: 68 mL/min/1.73

## 2024-09-14 LAB — CBC
Hematocrit: 43.7 % (ref 37.5–51.0)
Hemoglobin: 14.3 g/dL (ref 13.0–17.7)
MCH: 29.9 pg (ref 26.6–33.0)
MCHC: 32.7 g/dL (ref 31.5–35.7)
MCV: 91 fL (ref 79–97)
Platelets: 196 x10E3/uL (ref 150–450)
RBC: 4.78 x10E6/uL (ref 4.14–5.80)
RDW: 13.6 % (ref 11.6–15.4)
WBC: 6.4 x10E3/uL (ref 3.4–10.8)

## 2024-09-17 ENCOUNTER — Telehealth: Payer: Self-pay

## 2024-09-17 NOTE — Telephone Encounter (Signed)
 Heron would like echo before procedure, I called to schedule and left voice message

## 2024-09-20 ENCOUNTER — Other Ambulatory Visit (INDEPENDENT_AMBULATORY_CARE_PROVIDER_SITE_OTHER)

## 2024-09-20 DIAGNOSIS — D721 Eosinophilia, unspecified: Secondary | ICD-10-CM

## 2024-09-20 LAB — CBC WITH DIFFERENTIAL/PLATELET
Basophils Absolute: 0 K/uL (ref 0.0–0.1)
Basophils Relative: 1 % (ref 0.0–3.0)
Eosinophils Absolute: 0.9 K/uL — ABNORMAL HIGH (ref 0.0–0.7)
Eosinophils Relative: 18.4 % — ABNORMAL HIGH (ref 0.0–5.0)
HCT: 41.5 % (ref 39.0–52.0)
Hemoglobin: 14 g/dL (ref 13.0–17.0)
Lymphocytes Relative: 33.3 % (ref 12.0–46.0)
Lymphs Abs: 1.5 K/uL (ref 0.7–4.0)
MCHC: 33.8 g/dL (ref 30.0–36.0)
MCV: 88.6 fl (ref 78.0–100.0)
Monocytes Absolute: 0.4 K/uL (ref 0.1–1.0)
Monocytes Relative: 9.6 % (ref 3.0–12.0)
Neutro Abs: 1.7 K/uL (ref 1.4–7.7)
Neutrophils Relative %: 37.7 % — ABNORMAL LOW (ref 43.0–77.0)
Platelets: 199 K/uL (ref 150.0–400.0)
RBC: 4.69 Mil/uL (ref 4.22–5.81)
RDW: 14.3 % (ref 11.5–15.5)
WBC: 4.6 K/uL (ref 4.0–10.5)

## 2024-09-30 ENCOUNTER — Ambulatory Visit: Admission: RE | Admit: 2024-09-30 | Source: Ambulatory Visit

## 2024-10-01 ENCOUNTER — Ambulatory Visit (HOSPITAL_BASED_OUTPATIENT_CLINIC_OR_DEPARTMENT_OTHER)
Admission: RE | Admit: 2024-10-01 | Discharge: 2024-10-01 | Disposition: A | Source: Home / Self Care | Attending: Internal Medicine | Admitting: Internal Medicine

## 2024-10-01 ENCOUNTER — Other Ambulatory Visit: Payer: Self-pay

## 2024-10-01 ENCOUNTER — Ambulatory Visit: Admission: RE | Admit: 2024-10-01 | Source: Home / Self Care | Admitting: Internal Medicine

## 2024-10-01 ENCOUNTER — Encounter: Payer: Self-pay | Admitting: Internal Medicine

## 2024-10-01 ENCOUNTER — Encounter
Admission: RE | Disposition: A | Discharge status: Home / Self Care | Payer: Self-pay | Source: Home / Self Care | Attending: Internal Medicine

## 2024-10-01 DIAGNOSIS — I350 Nonrheumatic aortic (valve) stenosis: Secondary | ICD-10-CM | POA: Diagnosis not present

## 2024-10-01 DIAGNOSIS — I2510 Atherosclerotic heart disease of native coronary artery without angina pectoris: Secondary | ICD-10-CM | POA: Diagnosis not present

## 2024-10-01 DIAGNOSIS — R55 Syncope and collapse: Secondary | ICD-10-CM | POA: Insufficient documentation

## 2024-10-01 LAB — POCT I-STAT 7, (LYTES, BLD GAS, ICA,H+H)
Acid-base deficit: 3 mmol/L — ABNORMAL HIGH (ref 0.0–2.0)
Bicarbonate: 20.1 mmol/L (ref 20.0–28.0)
Calcium, Ion: 1.16 mmol/L (ref 1.15–1.40)
HCT: 42 % (ref 39.0–52.0)
Hemoglobin: 14.3 g/dL (ref 13.0–17.0)
O2 Saturation: 97 %
Potassium: 4.1 mmol/L (ref 3.5–5.1)
Sodium: 137 mmol/L (ref 135–145)
TCO2: 21 mmol/L — ABNORMAL LOW (ref 22–32)
pCO2 arterial: 30.6 mmHg — ABNORMAL LOW (ref 32–48)
pH, Arterial: 7.425 (ref 7.35–7.45)
pO2, Arterial: 90 mmHg (ref 83–108)

## 2024-10-01 LAB — ECHOCARDIOGRAM COMPLETE
AR max vel: 0.74 cm2
AV Area VTI: 0.71 cm2
AV Area mean vel: 0.62 cm2
AV Mean grad: 31.7 mmHg
AV Peak grad: 50 mmHg
Ao pk vel: 3.54 m/s
Area-P 1/2: 3.3 cm2
MV VTI: 1.32 cm2
P 1/2 time: 564 ms
S' Lateral: 2.4 cm

## 2024-10-01 LAB — POCT I-STAT EG7
Acid-base deficit: 2 mmol/L (ref 0.0–2.0)
Bicarbonate: 22.6 mmol/L (ref 20.0–28.0)
Calcium, Ion: 1.16 mmol/L (ref 1.15–1.40)
HCT: 42 % (ref 39.0–52.0)
Hemoglobin: 14.3 g/dL (ref 13.0–17.0)
O2 Saturation: 72 %
Potassium: 4.1 mmol/L (ref 3.5–5.1)
Sodium: 136 mmol/L (ref 135–145)
TCO2: 24 mmol/L (ref 22–32)
pCO2, Ven: 38.5 mmHg — ABNORMAL LOW (ref 44–60)
pH, Ven: 7.377 (ref 7.25–7.43)
pO2, Ven: 39 mmHg (ref 32–45)

## 2024-10-01 MED ORDER — SODIUM CHLORIDE 0.9% FLUSH: 3.0000 mL | INTRAVENOUS | Status: DC | PRN

## 2024-10-01 MED ORDER — VERAPAMIL HCL 2.5 MG/ML IV SOLN
INTRAVENOUS | Status: AC
  Filled 2024-10-01: qty 2

## 2024-10-01 MED ORDER — FREE WATER
500.0000 mL | Freq: Once | Status: AC
  Administered 2024-10-01: 500 mL via ORAL

## 2024-10-01 MED ORDER — HYDRALAZINE HCL 20 MG/ML IJ SOLN: 10.0000 mg | INTRAMUSCULAR | Status: DC | PRN

## 2024-10-01 MED ORDER — SODIUM CHLORIDE 0.9 % IV SOLN
250.0000 mL | INTRAVENOUS | Status: DC | PRN
  Administered 2024-10-01: 250 mL via INTRAVENOUS

## 2024-10-01 MED ORDER — ACETAMINOPHEN 325 MG PO TABS: 650.0000 mg | ORAL_TABLET | ORAL | Status: DC | PRN

## 2024-10-01 MED ORDER — ONDANSETRON HCL 4 MG/2ML IJ SOLN: 4.0000 mg | Freq: Four times a day (QID) | INTRAMUSCULAR | Status: DC | PRN

## 2024-10-01 MED ORDER — ASPIRIN 81 MG PO TBEC: 81.0000 mg | DELAYED_RELEASE_TABLET | Freq: Every day | ORAL | Status: AC

## 2024-10-01 MED ORDER — SODIUM CHLORIDE 0.9 % IV SOLN
INTRAVENOUS | Status: AC | PRN
  Administered 2024-10-01: 250 mL via INTRAVENOUS

## 2024-10-01 MED ORDER — ASPIRIN 81 MG PO CHEW
81.0000 mg | CHEWABLE_TABLET | Freq: Once | ORAL | Status: AC
  Administered 2024-10-01: 81 mg via ORAL

## 2024-10-01 MED ORDER — VERAPAMIL HCL 2.5 MG/ML IV SOLN
INTRAVENOUS | Status: DC | PRN
  Administered 2024-10-01 (×2): 2.5 mg via INTRA_ARTERIAL

## 2024-10-01 MED ORDER — HEPARIN SODIUM (PORCINE) 1000 UNIT/ML IJ SOLN
INTRAMUSCULAR | Status: DC | PRN
  Administered 2024-10-01: 3500 [IU] via INTRAVENOUS

## 2024-10-01 MED ORDER — HEPARIN (PORCINE) IN NACL 1000-0.9 UT/500ML-% IV SOLN
INTRAVENOUS | Status: DC | PRN
  Administered 2024-10-01: 1000 mL

## 2024-10-01 MED ORDER — LIDOCAINE HCL 1 % IJ SOLN
INTRAMUSCULAR | Status: AC
  Filled 2024-10-01: qty 20

## 2024-10-01 MED ORDER — HEPARIN SODIUM (PORCINE) 1000 UNIT/ML IJ SOLN
INTRAMUSCULAR | Status: AC
  Filled 2024-10-01: qty 10

## 2024-10-01 MED ORDER — FENTANYL CITRATE (PF) 100 MCG/2ML IJ SOLN
INTRAMUSCULAR | Status: AC
  Filled 2024-10-01: qty 2

## 2024-10-01 MED ORDER — LIDOCAINE HCL (PF) 1 % IJ SOLN
INTRAMUSCULAR | Status: DC | PRN
  Administered 2024-10-01: 2 mL

## 2024-10-01 MED ORDER — SODIUM CHLORIDE 0.9 % IV SOLN: 250.0000 mL | INTRAVENOUS | Status: DC | PRN

## 2024-10-01 MED ORDER — IOHEXOL 300 MG/ML  SOLN
INTRAMUSCULAR | Status: DC | PRN
  Administered 2024-10-01: 49 mL

## 2024-10-01 MED ORDER — ASPIRIN 81 MG PO CHEW
CHEWABLE_TABLET | ORAL | Status: AC
  Filled 2024-10-01: qty 1

## 2024-10-01 MED ORDER — SODIUM CHLORIDE 0.9% FLUSH: 3.0000 mL | Freq: Two times a day (BID) | INTRAVENOUS | Status: DC

## 2024-10-01 MED ORDER — MIDAZOLAM HCL 2 MG/2ML IJ SOLN
INTRAMUSCULAR | Status: AC
  Filled 2024-10-01: qty 2

## 2024-10-01 NOTE — Brief Op Note (Signed)
 BRIEF CARDIAC CATHETERIZATION NOTE  10/01/2024  12:57 PM  PATIENT:  Marvin Farley  89 y.o. male  PRE-OPERATIVE DIAGNOSIS:  Severe aortic stenosis  POST-OPERATIVE DIAGNOSIS:  Same  PROCEDURE:  Procedures: RIGHT/LEFT HEART CATH AND CORONARY ANGIOGRAPHY (Bilateral)  SURGEON:  Surgeons and Role:    DEWAINE Mady Bruckner, MD - Primary  FINDINGS: Mild-moderate, nonobstructive coronary artery disease. Normal left and right heart filling pressures. Normal cardiac output/index.  RECOMMENDATIONS: Medical therapy and risk factor modification prevent progression of coronary artery disease.  Consider checking a lipid panel and adding statin therapy with at follow-up. Proceed with structural heart team evaluation in the setting of severe AS by echo.  Bruckner Mady, MD [facility]

## 2024-10-01 NOTE — Interval H&P Note (Signed)
 History and Physical Interval Note:  10/01/2024 11:57 AM  Marvin Farley  has presented today for surgery, with the diagnosis of severe aortic stenosis.  The various methods of treatment have been discussed with the patient and family. After consideration of risks, benefits and other options for treatment, the patient has consented to  Procedures: RIGHT/LEFT HEART CATH AND CORONARY ANGIOGRAPHY (Bilateral) as a surgical intervention.  The patient's history has been reviewed, patient examined, no change in status, stable for surgery.  I have reviewed the patient's chart and labs.  Questions were answered to the patient's satisfaction.    Cath Lab Visit (complete for each Cath Lab visit)  Clinical Evaluation Leading to the Procedure:   ACS: No.  Non-ACS:    Anginal/Heart Failure Classification: NYHA class II  Anti-ischemic medical therapy: No Therapy  Non-Invasive Test Results: No non-invasive testing performed  Prior CABG: No previous CABG  Marvin Farley

## 2024-10-01 NOTE — Progress Notes (Signed)
*  PRELIMINARY RESULTS* Echocardiogram 2D Echocardiogram has been performed.  Marvin Farley 10/01/2024, 10:06 AM

## 2024-10-02 ENCOUNTER — Encounter: Payer: Self-pay | Admitting: Internal Medicine

## 2024-10-02 ENCOUNTER — Ambulatory Visit: Payer: Self-pay

## 2024-10-02 MED ORDER — ROSUVASTATIN CALCIUM 5 MG PO TABS: 5.0000 mg | ORAL_TABLET | Freq: Every day | ORAL | 3 refills | Status: AC

## 2024-10-03 ENCOUNTER — Ambulatory Visit

## 2024-10-04 ENCOUNTER — Ambulatory Visit: Admitting: *Deleted

## 2024-10-04 VITALS — Ht 65.0 in | Wt 146.0 lb

## 2024-10-04 DIAGNOSIS — Z0000 Encounter for general adult medical examination without abnormal findings: Secondary | ICD-10-CM | POA: Diagnosis not present

## 2024-10-04 NOTE — Patient Instructions (Signed)
 Mr. Lefferts,  Thank you for taking the time for your Medicare Wellness Visit. I appreciate your continued commitment to your health goals. Please review the care plan we discussed, and feel free to reach out if I can assist you further.  Please note that Annual Wellness Visits do not include a physical exam. Some assessments may be limited, especially if the visit was conducted virtually. If needed, we may recommend an in-person follow-up with your provider.  Ongoing Care Seeing your primary care provider every 3 to 6 months helps us  monitor your health and provide consistent, personalized care.  Consider updating your vaccines.  Referrals If a referral was made during today's visit and you haven't received any updates within two weeks, please contact the referred provider directly to check on the status.  Recommended Screenings:  Health Maintenance  Topic Date Due   Zoster (Shingles) Vaccine (1 of 2) Never done   COVID-19 Vaccine (3 - Moderna risk series) 12/25/2019   DTaP/Tdap/Td vaccine (2 - Td or Tdap) 11/16/2020   Flu Shot  12/03/2024*   Medicare Annual Wellness Visit  10/04/2025   Pneumococcal Vaccine for age over 13  Completed   Meningitis B Vaccine  Aged Out  *Topic was postponed. The date shown is not the original due date.       10/04/2024   11:40 AM  Advanced Directives  Does Patient Have a Medical Advance Directive? No  Type of Advance Directive --  Would patient like information on creating a medical advance directive? --    Vision: Annual vision screenings are recommended for early detection of glaucoma, cataracts, and diabetic retinopathy. These exams can also reveal signs of chronic conditions such as diabetes and high blood pressure.  Dental: Annual dental screenings help detect early signs of oral cancer, gum disease, and other conditions linked to overall health, including heart disease and diabetes.  Please see the attached documents for additional preventive  care recommendations.

## 2024-10-05 DIAGNOSIS — I350 Nonrheumatic aortic (valve) stenosis: Secondary | ICD-10-CM

## 2024-10-05 DIAGNOSIS — R55 Syncope and collapse: Secondary | ICD-10-CM

## 2024-10-08 ENCOUNTER — Ambulatory Visit: Payer: Self-pay | Admitting: Internal Medicine

## 2024-10-10 NOTE — Progress Notes (Unsigned)
 "  Patient ID: Marvin Farley MRN: [id number] DOB/AGE: [birth year] 89 y.o.  Primary Care Physician:Scott, Allena, MD Primary Cardiologist: [place]  CC:  Aortic valvular disease management     FOCUSED PROBLEM LIST:   Aortic stenosis AVA 0.71, MG 31, DI 0.23, SVI 33, V-max 3.5, EF 50 to 60% TTE January 2026 EKG January 2026 sinus rhythm without bundle-branch blocks CAD Mild to moderate multivessel disease coronary angiography January 2026 Hyperlipidemia BMI 24/BSA 1.12 October 2024:  Patient consents to use of AI scribe. The patient is 89 year old male with the above listed medical problems referred for recommendations regarding his aortic valve disease.  He was seen by his primary cardiologist and reported more frequent syncope.         Past Medical History:  Diagnosis Date   Basal cell carcinoma 12/15/2022   Left ear sup helix - EDC   BPH (benign prostatic hypertrophy)    Kidney stones    Left wrist fracture 09/05/1982   Osteoporosis    Right wrist fracture 09/05/1994    Past Surgical History:  Procedure Laterality Date   HERNIA REPAIR Bilateral    inguinal   RIGHT HEART CATH AND CORONARY ANGIOGRAPHY Bilateral 10/01/2024   Procedure: RIGHT HEART CATH AND CORONARY ANGIOGRAPHY;  Surgeon: Mady Bruckner, MD;  Location: [facility] INVASIVE CV LAB;  Service: Cardiovascular;  Laterality: Bilateral;   TONSILLECTOMY      Family History  Problem Relation Age of Onset   Cancer Mother        leukemia   Heart disease Father        heart attack   Cancer Brother        Lymphoma    Social History   Socioeconomic History   Marital status: Married    Spouse name: Not on file   Number of children: Not on file   Years of education: Not on file   Highest education level: Not on file  Occupational History   Not on file  Tobacco Use   Smoking status: Never   Smokeless tobacco: Never  Vaping Use   Vaping status: Never Used  Substance and Sexual Activity   Alcohol use: No     Alcohol/week: 0.0 standard drinks of alcohol   Drug use: No   Sexual activity: Not on file  Other Topics Concern   Not on file  Social History Narrative   married   Social Drivers of Health   Tobacco Use: Low Risk (10/04/2024)   Patient History    Smoking Tobacco Use: Never    Smokeless Tobacco Use: Never    Passive Exposure: Not on file  Financial Resource Strain: Low Risk (10/04/2024)   Overall Financial Resource Strain (CARDIA)    Difficulty of Paying Living Expenses: Not hard at all  Food Insecurity: No Food Insecurity (10/04/2024)   Epic    Worried About Radiation Protection Practitioner of Food in the Last Year: Never true    Ran Out of Food in the Last Year: Never true  Transportation Needs: No Transportation Needs (09/11/2023)   PRAPARE - Administrator, Civil Service (Medical): No    Lack of Transportation (Non-Medical): No  Physical Activity: Sufficiently Active (10/04/2024)   Exercise Vital Sign    Days of Exercise per Week: 7 days    Minutes of Exercise per Session: 150+ min  Stress: No Stress Concern Present (10/04/2024)   [employer] of Occupational Health - Occupational Stress Questionnaire    Feeling of Stress: Not at all  Social Connections: Moderately Integrated (10/04/2024)   Social Connection and Isolation Panel    Frequency of Communication with Friends and Family: Three times a week    Frequency of Social Gatherings with Friends and Family: Twice a week    Attends Religious Services: More than 4 times per year    Active Member of Clubs or Organizations: No    Attends Banker Meetings: Never    Marital Status: Married  Catering Manager Violence: Not At Risk (10/04/2024)   Epic    Fear of Current or Ex-Partner: No    Emotionally Abused: No    Physically Abused: No    Sexually Abused: No  Depression (PHQ2-9): Low Risk (10/04/2024)   Depression (PHQ2-9)    PHQ-2 Score: 0  Alcohol Screen: Low Risk (10/04/2024)   Alcohol Screen    Last Alcohol  Screening Score (AUDIT): 0  Housing: Low Risk (10/04/2024)   Epic    Unable to Pay for Housing in the Last Year: No    Number of Times Moved in the Last Year: 0    Homeless in the Last Year: No  Utilities: Not At Risk (10/04/2024)   Epic    Threatened with loss of utilities: No  Health Literacy: Adequate Health Literacy (10/04/2024)   B1300 Health Literacy    Frequency of need for help with medical instructions: Never     Prior to Admission medications  Medication Sig Start Date End Date Taking? Authorizing Provider  Ascorbic Acid (VITAMIN C) 500 MG CAPS Take 1 tablet by mouth daily.    [provider]  aspirin  EC 81 MG tablet Take 1 tablet (81 mg total) by mouth daily. Swallow whole. 10/01/24   End, Lonni, MD  brimonidine (ALPHAGAN) 0.2 % ophthalmic solution Place 1 drop into both eyes daily. 12/28/21   [provider]  EPINEPHrine  0.3 mg/0.3 mL IJ SOAJ injection Inject 0.3 mg into the muscle as needed for anaphylaxis. 05/17/22   Bradler, Evan K, MD  Multiple Vitamin (MULTIVITAMIN) capsule Take 1 capsule by mouth daily.    [provider]  rosuvastatin  (CRESTOR ) 5 MG tablet Take 1 tablet (5 mg total) by mouth daily. 10/02/24 12/31/24  [place] Clap, MD  silodosin  (RAPAFLO ) 4 MG CAPS capsule Take 1 capsule (4 mg total) by mouth daily with breakfast. 07/25/24   Glendia Shad, MD    Allergies[1]  REVIEW OF SYSTEMS:  General: no fevers/chills/night sweats Eyes: no blurry vision, diplopia, or amaurosis ENT: no sore throat or hearing loss Resp: no cough, wheezing, or hemoptysis CV: no edema or palpitations GI: no abdominal pain, nausea, vomiting, diarrhea, or constipation GU: no dysuria, frequency, or hematuria Skin: no rash Neuro: no headache, numbness, tingling, or weakness of extremities Musculoskeletal: no joint pain or swelling Heme: no bleeding, DVT, or easy bruising Endo: no polydipsia or polyuria  There were no vitals taken for this  visit.  PHYSICAL EXAM: GEN:  AO x 3 in no acute distress HEENT: Normal Dentition: Normal*** Neck: JVP normal. +2***carotid upstrokes without bruits. No thyromegaly. Lungs: equal expansion, clear bilaterally CV: Apex is discrete and nondisplaced, RRR without murmur or gallop*** Abd: soft, non-tender, non-distended; no bruit; positive bowel sounds Ext: no edema, ecchymoses, or cyanosis Vascular: 2+ femoral pulses, 2+ radial pulses       Skin: warm and dry without rash Neuro: CN II-XII grossly intact; motor and sensory grossly intact    DATA AND STUDIES:  EKG:  January 2026 sinus rhythm without bundle-branch blocks  EKG Interpretation  Date/Time:    Ventricular Rate:    PR Interval:    QRS Duration:    QT Interval:    QTC Calculation:   R Axis:      Text Interpretation:          CARDIAC STUDIES: Refer to CV Procedures and Imaging Tabs  10/31/2023: TSH 1.80 07/25/2024: ALT 7 09/13/2024: BUN 22; Creatinine, Ser 1.04 09/20/2024: Platelets 199.0 10/01/2024: Hemoglobin 14.3; Potassium 4.1; Sodium 137   STS RISK CALCULATOR: Pending  NYHA CLASS: ***    ASSESSMENT AND PLAN:   1. Nonrheumatic aortic valve stenosis   2. Coronary artery disease involving native coronary artery of native heart without angina pectoris   3. Hyperlipidemia LDL goal <70     Aortic stenosis: I reviewed the patient's echocardiogram which demonstrates a highly calcified valve with restricted motion.  His echo indices are consistent with paradoxical low-flow low gradient aortic stenosis.  He underwent coronary angiography which demonstrated mild to moderate disease.  *** Coronary artery disease: Continue aspirin  81 mg, rosuvastatin  5 mg Hyperlipidemia: Continue rosuvastatin  5 mg   I have personally reviewed the patients imaging data as summarized above.  I have reviewed the natural history of aortic stenosis with the patient and family members who are present today. We have discussed the  limitations of medical therapy and the poor prognosis associated with symptomatic aortic stenosis. We have also reviewed potential treatment options, including palliative medical therapy, conventional surgical aortic valve replacement, and transcatheter aortic valve replacement. We discussed treatment options in the context of this patient's specific comorbid medical conditions.   All of the patient's questions were answered today. Will make further recommendations based on the results of studies outlined above.   I spent *** minutes reviewing all clinical data during and prior to this visit including all relevant imaging studies, laboratories, clinical information from other health systems and prior notes from both Cardiology and other specialties, interviewing the patient, conducting a complete physical examination, and coordinating care in order to formulate a comprehensive and personalized evaluation and treatment plan.   Shamyia Grandpre K Carthel Castille, MD  10/10/2024 4:52 PM    [facility] [address] [place], [place], [place]  [address] Phone: [phone]; Fax: [phone]        [1]  Allergies Allergen Reactions   Bee Venom Other (See Comments)    Patient passes out   Codeine Sulfate    "

## 2024-10-11 NOTE — Telephone Encounter (Signed)
 Copied from CRM #[id number]. Topic: Clinical - Lab/Test Results >> Oct 11, 2024 11:03 AM Delon DASEN wrote: Reason for CRM: gave message about labs, no questions

## 2024-10-21 ENCOUNTER — Ambulatory Visit: Admitting: Internal Medicine

## 2024-10-21 DIAGNOSIS — I350 Nonrheumatic aortic (valve) stenosis: Secondary | ICD-10-CM

## 2024-10-21 DIAGNOSIS — E785 Hyperlipidemia, unspecified: Secondary | ICD-10-CM

## 2024-10-21 DIAGNOSIS — I2510 Atherosclerotic heart disease of native coronary artery without angina pectoris: Secondary | ICD-10-CM

## 2024-10-23 ENCOUNTER — Ambulatory Visit: Admitting: Internal Medicine

## 2024-10-25 ENCOUNTER — Ambulatory Visit: Admitting: Internal Medicine

## 2024-10-31 ENCOUNTER — Ambulatory Visit: Admitting: Internal Medicine

## 2025-10-08 ENCOUNTER — Ambulatory Visit
